# Patient Record
Sex: Male | Born: 1950
Health system: Southern US, Community
[De-identification: ages and names within clinical notes are randomized; demographics above are authoritative.]

## PROBLEM LIST (undated history)

## (undated) ENCOUNTER — Encounter (HOSPITAL_COMMUNITY): Admission: RE | Payer: Self-pay | Source: Ambulatory Visit

## (undated) DIAGNOSIS — I10 Essential (primary) hypertension: Secondary | ICD-10-CM

## (undated) DIAGNOSIS — K219 Gastro-esophageal reflux disease without esophagitis: Secondary | ICD-10-CM

## (undated) DIAGNOSIS — G8929 Other chronic pain: Secondary | ICD-10-CM

## (undated) DIAGNOSIS — N289 Disorder of kidney and ureter, unspecified: Secondary | ICD-10-CM

## (undated) DIAGNOSIS — R6889 Other general symptoms and signs: Secondary | ICD-10-CM

## (undated) DIAGNOSIS — R221 Localized swelling, mass and lump, neck: Secondary | ICD-10-CM

## (undated) DIAGNOSIS — C760 Malignant neoplasm of head, face and neck: Secondary | ICD-10-CM

## (undated) DIAGNOSIS — Z973 Presence of spectacles and contact lenses: Secondary | ICD-10-CM

## (undated) DIAGNOSIS — E079 Disorder of thyroid, unspecified: Secondary | ICD-10-CM

## (undated) DIAGNOSIS — C801 Malignant (primary) neoplasm, unspecified: Secondary | ICD-10-CM

## (undated) HISTORY — DX: Malignant neoplasm of head, face and neck (CMS HCC): C76.0

## (undated) HISTORY — DX: Essential (primary) hypertension: I10

## (undated) HISTORY — DX: Disorder of thyroid, unspecified: E07.9

## (undated) HISTORY — DX: Gastro-esophageal reflux disease without esophagitis: K21.9

## (undated) HISTORY — DX: Malignant (primary) neoplasm, unspecified: C80.1

## (undated) HISTORY — PX: OTHER SURGICAL HISTORY: SHX169

## (undated) SURGERY — IR INFUSAPORT PLACEMENT
Laterality: Left

## (undated) SURGERY — IR G TUBE PLACEMENT

---

## 2008-02-29 ENCOUNTER — Ambulatory Visit: Payer: Self-pay | Admitting: Family Medicine

## 2008-03-11 ENCOUNTER — Ambulatory Visit: Payer: Self-pay | Admitting: Family Medicine

## 2009-11-11 ENCOUNTER — Other Ambulatory Visit (HOSPITAL_COMMUNITY): Payer: Self-pay | Admitting: EXTERNAL

## 2010-01-25 ENCOUNTER — Ambulatory Visit
Admission: RE | Admit: 2010-01-25 | Discharge: 2010-01-25 | Disposition: A | Discharge status: Home / Self Care | Payer: 59 | Source: Ambulatory Visit | Attending: EXTERNAL | Admitting: EXTERNAL

## 2010-01-25 DIAGNOSIS — K759 Inflammatory liver disease, unspecified: Secondary | ICD-10-CM | POA: Insufficient documentation

## 2010-03-17 ENCOUNTER — Other Ambulatory Visit (HOSPITAL_COMMUNITY): Payer: Self-pay | Admitting: PHYSICIAN ASSISTANT

## 2010-05-12 ENCOUNTER — Ambulatory Visit
Admission: RE | Admit: 2010-05-12 | Discharge: 2010-05-12 | Disposition: A | Discharge status: Home / Self Care | Payer: 59 | Source: Ambulatory Visit

## 2010-05-12 DIAGNOSIS — D1800 Hemangioma unspecified site: Secondary | ICD-10-CM | POA: Insufficient documentation

## 2010-05-12 MED ORDER — IOVERSOL 320 MG IODINE/ML INTRAVENOUS SOLUTION
120.00 mL | INTRAVENOUS | Status: AC
Start: 2010-05-12 — End: 2010-05-12
  Administered 2010-05-12: 120 mL via INTRAVENOUS

## 2010-05-12 MED ORDER — DIATRIZOATE MEGLUMINE-DIATRIZOATE SODIUM 66 %-10 % ORAL SOLUTION
8.00 mL | ORAL | Status: AC
Start: 2010-05-12 — End: 2010-05-12
  Administered 2010-05-12: 8 mL via ORAL

## 2010-05-12 NOTE — Nurses Notes (Signed)
 Arrived for contrasted abdominal CT.History of CRI and HTN.Creatinine 1.5 today.Dr.Hamilton notified.OK for CT.Instructed to hydrate with at least 64 oz.for 2 days-verbalized understanding.

## 2014-12-23 ENCOUNTER — Encounter (INDEPENDENT_AMBULATORY_CARE_PROVIDER_SITE_OTHER): Payer: Self-pay | Admitting: Otolaryngology

## 2014-12-23 ENCOUNTER — Ambulatory Visit (HOSPITAL_BASED_OUTPATIENT_CLINIC_OR_DEPARTMENT_OTHER): Payer: 59 | Admitting: Rheumatology

## 2014-12-23 ENCOUNTER — Other Ambulatory Visit (INDEPENDENT_AMBULATORY_CARE_PROVIDER_SITE_OTHER): Payer: Self-pay | Admitting: Otolaryngology

## 2014-12-23 ENCOUNTER — Ambulatory Visit: Payer: 59 | Attending: Otolaryngology | Admitting: Otolaryngology

## 2014-12-23 VITALS — BP 124/62 | HR 90 | Temp 97.8°F | Ht 67.52 in | Wt 187.6 lb

## 2014-12-23 DIAGNOSIS — R221 Localized swelling, mass and lump, neck: Secondary | ICD-10-CM

## 2014-12-23 DIAGNOSIS — J387 Other diseases of larynx: Secondary | ICD-10-CM | POA: Insufficient documentation

## 2014-12-23 DIAGNOSIS — D487 Neoplasm of uncertain behavior of other specified sites: Secondary | ICD-10-CM

## 2014-12-23 DIAGNOSIS — Z7982 Long term (current) use of aspirin: Secondary | ICD-10-CM | POA: Insufficient documentation

## 2014-12-23 DIAGNOSIS — Z79899 Other long term (current) drug therapy: Secondary | ICD-10-CM | POA: Insufficient documentation

## 2014-12-23 DIAGNOSIS — I10 Essential (primary) hypertension: Secondary | ICD-10-CM | POA: Insufficient documentation

## 2014-12-23 DIAGNOSIS — Z791 Long term (current) use of non-steroidal anti-inflammatories (NSAID): Secondary | ICD-10-CM | POA: Insufficient documentation

## 2014-12-24 LAB — HISTORICAL CYTOPATHOLOGY-FINE NEEDLE ASPIRATE

## 2014-12-24 NOTE — H&P (Addendum)
PATIENT NAME:  Clayton Wheeler  MRN:  [id number]  DOB:  08/26/1950  DATE OF SERVICE: 12/23/2014    Chief Complaint:  Neck Mass      HPI:  Clayton Wheeler is a 64 y.o. male who presents today with complaints of right neck mass.  He states he first noted this about 6 months ago.  It has gotten rather large recently.  He has had a CT scan done at [facility].  He is an inmate at [facility].  He states that he has some mild sore throats and some burning when swallowing liquids.  He has not choked or aspirated.  He denies any fevers or chills.  He denies night sweats.  He has no change in his voice or shortness of breath.  He denies any smoking history.  He has no other complaints today.  He states he has not had any biopsies.      Past Medical History:  Past Medical History   Diagnosis Date    HTN (hypertension)            Past Surgical History:  History reviewed. No pertinent past surgical history.        Family History:  Family History   Problem Relation Age of Onset    Diabetes Mother     Cancer Mother     Diabetes Sister     Diabetes Brother            Social History:  History   Smoking status    Never Smoker    Smokeless tobacco    Not on file     History   Alcohol Use No     Social History     Occupational History    Not on file.       Medications:  Outpatient Prescriptions Marked as Taking for the 12/23/14 encounter (Office Visit) with Jola Schmidt, MD   Medication Sig    allopurinol (ZYLOPRIM) 100 mg Oral Tablet Take 100 mg by mouth Once a day    amLODIPine (NORVASC) 10 mg Oral Tablet Take 10 mg by mouth Once a day    aspirin (ECOTRIN) 81 mg Oral Tablet, Delayed Release (E.C.) Take 81 mg by mouth Once a day    docusate sodium (COLACE) 100 mg Oral Capsule Take 100 mg by mouth Twice daily    naproxen (NAPROSYN) 500 mg Oral Tablet Take 500 mg by mouth Twice daily with food       Allergies:  No Known Allergies    Review of Systems:  Do you have any fevers: no   Any  weight change: no   Change in your vision: no    Chest Pain: no   Shortness of Breath: no   Stomach pain: no   Urinary difficulity: yes Explain Urinary Difficulty: sometimes Joint Pain: yes Explain Joint Pain: back pain  Skin Problems: no   Weakness or Numbness: no   Easy Bruising or Bleeding: no   Excessive Thirst: no   Seasonal Allergies: no    All other systems reviewed and found to be negative.    Physical Exam:  Blood pressure 124/62, pulse 90, temperature 36.6 C (97.8 F), height 1.715 m (5' 7.52"), weight 85.1 kg (187 lb 9.8 oz), SpO2 99 %.  Body mass index is 28.93 kg/(m^2).  General Appearance: Pleasant, cooperative, healthy, and in no acute distress.  Eyes: Conjunctivae/corneas clear, PERRLA, EOM's intact.  Head and Face: Normocephalic, atraumatic.  Face symmetric, no  obvious lesions.   Pinnae: Normal shape and position.   External auditory canals:  Patent without inflammation.  Tympanic membranes:  Intact, translucent, midposition, middle ear aerated.  Nose:  External pyramid midline. Septum midline. Mucosa normal. No purulence, polyps, or crusts.   Oral Cavity/Oropharynx: No mucosal lesions, masses, or pharyngeal asymmetry.  Tonsils: 1-2+ on the right, 1 + left  Hypopharynx/Larynx: Indirect mirror laryngoscopy revealed an exophytic mass in the base of tongue and vallecula  Neck:  Large firm, slightly mobile neck mass on the right.    Heme/Lymph:  No cervical adenopathy.  Cardiovascular:  Good perfusion of upper extremities.  No cyanosis of the hands or fingers.  Lungs: No apparent stridorous breathing. No acute distress.  Skin: Skin warm and dry.  Neurologic: Cranial nerves:  grossly intact.  Psychiatric:  Alert and oriented x 3.    Procedure:  FNA biopsy was performed today of the right neck mass.  The neck was cleansed with Betadine and a 0.5 mL of lidocaine with epinephrine was injected.  Three attempts at fine-needle aspiration were performed and sent to pathology.  The patient experienced minimal  to no bleeding.    Flexible laryngoscopy was performed today using Elba Barman flexible laryngoscope.  The nose was sprayed with lidocaine and phenylephrine solution.  The scope was passed through the nose and the nose and nasopharynx were viewed with no abnormalities.  Examination of the hypopharynx reveals an exophytic mass coming off the base of tongue vallecula area.  The epiglottis appears clear, no masses of the AE fold or piriform sinuses.  The vocal cords are mobile bilaterally.  The patient tolerated the procedure well.      Data Reviewed: CT neck was reviewed on [facility] today with Dr. Beaulah Corin.  This did reveal a 7-cm neck mass.  It also showed a large base of tongue mass.        Assessment:  1. Neck mass    2. Mass of vallecula        Plan:  FNA biopsy was performed today of right neck mass.  Consent was obtained today for panendoscopy with biopsy.  The risks and benefits of surgery were discussed and questions answered to the patient's satisfaction.  Dr. Beaulah Corin advised the patient that after we get the results of the FNA, we would like to order a PET/CT scan.  This is concerning for malignancy.  I would also like to see him back the day of the PET/CT to review the scans.  We also will do preadmission testing.  We will contact the prison facility to arrange this.  The patient states good understanding and he is in agreement.      Orders Placed This Encounter    HEAD AND NECK BIOPSY (AMB ONLY)    PET LF:YBOF (HEAD TO THIGH) W IV CONTRAST    CYTOPATHOLOGY-FINE NEEDLE ASPIRATE (BEAKER)    CYTOPATHOLOGY-FINE NEEDLE ASPIRATE     Pt seen in clinic with Dr. Karie Mainland, PA-C 12/24/2014, 15:23  I personally saw and evaluated the patient. See mid-level's note for additional details. My findings/participation are right neck mass and BOT mass, FNA done today as described above. I discussed that if this is positive, will need to proceed with biopsy of the BOT and PET scan as well, the patient expressed  understanding and is agreeable.    Jola Schmidt, MD      PCP:  Spiro Girard [id number]  REF:  Jenell Milliner., MD  [address]  [place], [place] [id number]

## 2015-01-14 ENCOUNTER — Other Ambulatory Visit (INDEPENDENT_AMBULATORY_CARE_PROVIDER_SITE_OTHER): Payer: Self-pay

## 2015-01-14 ENCOUNTER — Encounter (INDEPENDENT_AMBULATORY_CARE_PROVIDER_SITE_OTHER): Payer: Self-pay

## 2015-01-14 ENCOUNTER — Ambulatory Visit
Admission: RE | Admit: 2015-01-14 | Discharge: 2015-01-14 | Disposition: A | Discharge status: Home / Self Care | Payer: 59 | Source: Ambulatory Visit | Attending: Otolaryngology | Admitting: Otolaryngology

## 2015-01-14 VITALS — BP 142/84 | HR 90 | Temp 98.9°F | Ht 67.8 in | Wt 190.3 lb

## 2015-01-14 DIAGNOSIS — R221 Localized swelling, mass and lump, neck: Secondary | ICD-10-CM | POA: Insufficient documentation

## 2015-01-14 DIAGNOSIS — I10 Essential (primary) hypertension: Principal | ICD-10-CM | POA: Insufficient documentation

## 2015-01-14 DIAGNOSIS — Z01818 Encounter for other preprocedural examination: Secondary | ICD-10-CM

## 2015-01-14 DIAGNOSIS — R9431 Abnormal electrocardiogram [ECG] [EKG]: Secondary | ICD-10-CM

## 2015-01-14 DIAGNOSIS — J387 Other diseases of larynx: Secondary | ICD-10-CM

## 2015-01-14 HISTORY — DX: Other general symptoms and signs: R68.89

## 2015-01-14 HISTORY — DX: Other chronic pain: G89.29

## 2015-01-14 HISTORY — DX: Disorder of kidney and ureter, unspecified: N28.9

## 2015-01-14 HISTORY — DX: Localized swelling, mass and lump, neck: R22.1

## 2015-01-14 HISTORY — DX: Gastro-esophageal reflux disease without esophagitis: K21.9

## 2015-01-14 HISTORY — DX: Presence of spectacles and contact lenses: Z97.3

## 2015-01-14 LAB — BASIC METABOLIC PANEL
ANION GAP: 9 mmol/L (ref 4–13)
BUN/CREA RATIO: 10 (ref 6–22)
BUN: 15 mg/dL (ref 8–25)
CALCIUM: 9.2 mg/dL (ref 8.5–10.4)
CHLORIDE: 105 mmol/L (ref 96–111)
CO2 TOTAL: 26 mmol/L (ref 22–32)
CREATININE: 1.52 mg/dL — ABNORMAL HIGH (ref 0.62–1.27)
ESTIMATED GFR: >59 mL/min/1.73mˆ2 (ref >59)
GLUCOSE: 112 mg/dL (ref 65–139)
POTASSIUM: 4.1 mmol/L (ref 3.5–5.1)
SODIUM: 140 mmol/L (ref 136–145)

## 2015-01-14 LAB — POC BLOOD GLUCOSE (RESULTS): GLUCOSE, POC: 90 mg/dL (ref 70–105)

## 2015-01-16 ENCOUNTER — Encounter (HOSPITAL_COMMUNITY): Payer: Self-pay

## 2015-01-16 ENCOUNTER — Encounter (HOSPITAL_COMMUNITY)
Admission: RE | Disposition: A | Discharge status: Home / Self Care | Payer: Self-pay | Source: Ambulatory Visit | Attending: Otolaryngology

## 2015-01-16 ENCOUNTER — Ambulatory Visit (HOSPITAL_COMMUNITY): Payer: 59 | Admitting: Anesthesiology

## 2015-01-16 ENCOUNTER — Ambulatory Visit (HOSPITAL_BASED_OUTPATIENT_CLINIC_OR_DEPARTMENT_OTHER): Payer: 59 | Admitting: Otolaryngology

## 2015-01-16 ENCOUNTER — Inpatient Hospital Stay
Admission: RE | Admit: 2015-01-16 | Discharge: 2015-01-16 | Disposition: A | Discharge status: Home / Self Care | Payer: 59 | Source: Ambulatory Visit | Attending: Otolaryngology | Admitting: Otolaryngology

## 2015-01-16 ENCOUNTER — Ambulatory Visit (HOSPITAL_BASED_OUTPATIENT_CLINIC_OR_DEPARTMENT_OTHER): Payer: 59 | Admitting: Anesthesiology

## 2015-01-16 DIAGNOSIS — Z7982 Long term (current) use of aspirin: Secondary | ICD-10-CM | POA: Insufficient documentation

## 2015-01-16 DIAGNOSIS — R220 Localized swelling, mass and lump, head: Secondary | ICD-10-CM

## 2015-01-16 DIAGNOSIS — R221 Localized swelling, mass and lump, neck: Secondary | ICD-10-CM

## 2015-01-16 DIAGNOSIS — I10 Essential (primary) hypertension: Secondary | ICD-10-CM | POA: Insufficient documentation

## 2015-01-16 DIAGNOSIS — C01 Malignant neoplasm of base of tongue: Secondary | ICD-10-CM | POA: Insufficient documentation

## 2015-01-16 DIAGNOSIS — C760 Malignant neoplasm of head, face and neck: Secondary | ICD-10-CM | POA: Insufficient documentation

## 2015-01-16 LAB — ECG 12-LEAD (PERFORMED IN PREADMISSION UNIT ONLY)
Atrial Rate: 95 {beats}/min
Calculated P Axis: 73 degrees
Calculated R Axis: -37 degrees
Calculated T Axis: 39 degrees
PR Interval: 132 ms
QRS Duration: 100 ms
QT Interval: 352 ms
QTC Calculation: 442 ms
Ventricular rate: 95 {beats}/min

## 2015-01-16 SURGERY — PANENDOSCOPY WITH BIOPSY
Anesthesia: General | Wound class: Clean Contaminated Wounds-The respiratory, GI, Genital, or urinary

## 2015-01-16 MED ORDER — LIDOCAINE (PF) 100 MG/5 ML (2 %) INTRAVENOUS SYRINGE
INJECTION | Freq: Once | INTRAVENOUS | Status: DC | PRN
Start: 2015-01-16 — End: 2015-01-16
  Administered 2015-01-16: 50 mg via INTRAVENOUS

## 2015-01-16 MED ORDER — MIDAZOLAM 1 MG/ML INJECTION SOLUTION
Freq: Once | INTRAMUSCULAR | Status: DC | PRN
Start: 2015-01-16 — End: 2015-01-16
  Administered 2015-01-16: 2 mg via INTRAVENOUS

## 2015-01-16 MED ORDER — LIDOCAINE 1 %-EPINEPHRINE 1:100,000 INJECTION SOLUTION
5.00 mL | Freq: Once | INTRAMUSCULAR | Status: DC | PRN
Start: 2015-01-16 — End: 2015-01-16

## 2015-01-16 MED ORDER — BACITRACIN 500 UNIT/G OINTMENT TUBE
TOPICAL_OINTMENT | Freq: Once | CUTANEOUS | Status: DC | PRN
Start: 2015-01-16 — End: 2015-01-16

## 2015-01-16 MED ORDER — ACETAMINOPHEN 300 MG-CODEINE 30 MG TABLET
2.00 | ORAL_TABLET | ORAL | Status: AC | PRN
Start: 2015-01-16 — End: ?

## 2015-01-16 MED ORDER — SODIUM CHLORIDE 0.9 % (FLUSH) INJECTION SYRINGE
2.00 mL | INJECTION | INTRAMUSCULAR | Status: DC | PRN
Start: 2015-01-16 — End: 2015-01-16

## 2015-01-16 MED ORDER — FENTANYL (PF) 50 MCG/ML INJECTION SOLUTION
Freq: Once | INTRAMUSCULAR | Status: DC | PRN
Start: 2015-01-16 — End: 2015-01-16
  Administered 2015-01-16: 100 ug via INTRAVENOUS

## 2015-01-16 MED ORDER — DEXAMETHASONE SODIUM PHOSPHATE 4 MG/ML INJECTION SOLUTION
Freq: Once | INTRAMUSCULAR | Status: DC | PRN
Start: 2015-01-16 — End: 2015-01-16
  Administered 2015-01-16: 8 mg via INTRAVENOUS

## 2015-01-16 MED ORDER — PROPOFOL 10 MG/ML IV BOLUS
INJECTION | Freq: Once | INTRAVENOUS | Status: DC | PRN
Start: 2015-01-16 — End: 2015-01-16
  Administered 2015-01-16: 150 mg via INTRAVENOUS

## 2015-01-16 MED ORDER — REMIFENTANIL 50 MCG/ML INFUSION - FOR ANES
INTRAVENOUS | Status: DC | PRN
Start: 2015-01-16 — End: 2015-01-16
  Administered 2015-01-16: 0 ug/kg/min via INTRAVENOUS
  Administered 2015-01-16: 0.05 ug/kg/min via INTRAVENOUS

## 2015-01-16 MED ORDER — ROCURONIUM 10 MG/ML INTRAVENOUS SOLUTION
Freq: Once | INTRAVENOUS | Status: DC | PRN
Start: 2015-01-16 — End: 2015-01-16
  Administered 2015-01-16: 50 mg via INTRAVENOUS

## 2015-01-16 MED ORDER — ONDANSETRON HCL (PF) 4 MG/2 ML INJECTION SOLUTION
Freq: Once | INTRAMUSCULAR | Status: DC | PRN
Start: 2015-01-16 — End: 2015-01-16
  Administered 2015-01-16: 4 mg via INTRAVENOUS

## 2015-01-16 MED ORDER — EPINEPHRINE 0.1 MG/ML INJECTION SYRINGE
10.00 mL | INJECTION | Freq: Once | INTRAMUSCULAR | Status: DC | PRN
Start: 2015-01-16 — End: 2015-01-16
  Administered 2015-01-16: 10 mL via TOPICAL

## 2015-01-16 MED ORDER — SODIUM CHLORIDE 0.9 % IRRIGATION SOLUTION
1000.00 mL | Status: DC | PRN
Start: 2015-01-16 — End: 2015-01-16
  Administered 2015-01-16: 1000 mL

## 2015-01-16 MED ORDER — OXYMETAZOLINE 0.05 % NASAL SPRAY
30.00 mL | Freq: Once | NASAL | Status: DC | PRN
Start: 2015-01-16 — End: 2015-01-16

## 2015-01-16 MED ORDER — HYDROMORPHONE 1 MG/ML INJECTION WRAPPER
0.40 mg | INJECTION | INTRAMUSCULAR | Status: DC | PRN
Start: 2015-01-16 — End: 2015-01-16
  Administered 2015-01-16 (×3): 0.4 mg via INTRAVENOUS
  Filled 2015-01-16 (×2): qty 1

## 2015-01-16 MED ORDER — SODIUM CHLORIDE 0.9 % (FLUSH) INJECTION SYRINGE
2.00 mL | INJECTION | Freq: Three times a day (TID) | INTRAMUSCULAR | Status: DC
Start: 2015-01-16 — End: 2015-01-16

## 2015-01-16 MED ORDER — PROPOFOL 10 MG/ML INTRAVENOUS EMULSION
INTRAVENOUS | Status: DC | PRN
Start: 2015-01-16 — End: 2015-01-16
  Administered 2015-01-16: 0 ug/kg/min via INTRAVENOUS
  Administered 2015-01-16: 50 ug/kg/min via INTRAVENOUS

## 2015-01-16 MED ORDER — LACTATED RINGERS INTRAVENOUS SOLUTION
INTRAVENOUS | Status: DC
Start: 2015-01-16 — End: 2015-01-16

## 2015-01-16 MED ADMIN — lactated Ringers intravenous solution: INTRAVENOUS | @ 09:00:00 | NDC 00338011704

## 2015-01-16 SURGICAL SUPPLY — 41 items
ADHESIVE TISSUE EXOFIN 1.0ML_PREMIERPRO EXOFIN (SEALANTS)
APPLIER PREM SRGCLP II SUP INTLK 9.75IN ATO INTERNAL CLIP VAS LF  DISP ENDOS RADGR MRK 20 MED TI (ENDOSCOPIC SUPPLIES) ×1 IMPLANT
APPLIER SURGICLIP 9.0 BLK (ENDOSCOPIC SUPPLIES) ×1 IMPLANT
APPLIER SURGICLIP 9.0 BLK (INSTRUMENTS ENDOMECHANICAL) ×1
APPLIER SURGICLIP MED TI 9.75_134051 6EA/BX (INSTRUMENTS ENDOMECHANICAL) ×1
BLANKET 3M BAIR HUG ADLT LWR B ODY 60X36IN PLMR AIR SYS LTWT (MISCELLANEOUS PT CARE ITEMS) ×2 IMPLANT
CONV USE 338643 - PACK SURG HEAD NK NONST DISP LF (CUSTOM TRAYS & PACK) IMPLANT
CONV USE ITEM 156524 - ADHESIVE TISSUE EXOFIN 1.0ML_PREMIERPRO EXOFIN (SEALANTS) IMPLANT
DISCONTINUED USE ITEM 97927 - SUTURE 3-0 SH-1 VICRYL 18IN VIOL CR BRD 8 STRN COAT ABS (SUTURE/WOUND CLOSURE) IMPLANT
DONUT EXTREMITY CUSHIONING 31143137 (POSITIONING PRODUCTS) ×2 IMPLANT
DRAIN INCS .25IN 12IN PNRS RUB SAF PIN RADOPQ STRL LTX STD DISP 4067 (Drains/Resovoirs) ×1 IMPLANT
DRAIN PENROSE 1/4INX12INL STRL_30414025 100/CS (Drains/Resovoirs) ×1
DRAPE 2 LYR ABS 70X40IN MED UN_IV LF DISP SURG BILAMINATE (PROTECTIVE PRODUCTS/GARMENTS) ×1
DRAPE FNFLD SHEET 70X40IN MED PRXM LF  STRL DISP SURG SMS (PROTECTIVE PRODUCTS/GARMENTS) ×1 IMPLANT
GOWN SURG XL AAMI L3 NONREINFO_RCE HKLP CLSR STRL LTX PNK SMS (DGOW)
GOWN SURG XL L3 NONREINFORCE HKLP CLSR STRL LTX PNK SMS 47IN (DGOW) IMPLANT
HANDLE RIGID PLASTIC STRL LF  DISP DVN EZ HNDL SURG LIGHT (INSTRUMENTS) ×1
HANDLE RIGID PLASTIC STRL LF_DISP DVN EZ HNDL SURG LIGHT (INSTRUMENTS) ×1
KIT RM TURNOVER CLEANOP CSTM INFCT CONTROL (KITS & TRAYS (DISPOSABLE)) ×1
KIT RM TURNOVER CLEANOP CSTM I_NFCT CONTROL (KITS & TRAYS (DISPOSABLE)) ×1
KIT RM TURNOVER CLEANOP CUSTOM INFCT CONTROL (KITS & TRAYS (DISPOSABLE)) ×1 IMPLANT
LABEL E-Z STICK_STLEZP1 100EA/CS (LABELS/CHART SUPPLIES) ×1
LABEL MED EZ PEEL MRKR LF (LABELS/CHART SUPPLIES) ×1 IMPLANT
MBO USE ITEM 317672 - HANDLE RIGID PLASTIC STRL LF  DISP DVN EZ HNDL SURG LIGHT (SURGICAL INSTRUMENTS) ×1 IMPLANT
PACK CUSTOM HEAD AND NECK (CUSTOM TRAYS & PACK)
PAD ARMBOARD FOAM BLU_FP-ECARM (POSITIONING PRODUCTS) ×2
PAD ARMBRD BLU (POSITIONING PRODUCTS) ×2 IMPLANT
SET IV BUTTERFLY 18GA 383539 80/CS (NEEDLES & SYRINGE SUPPLIES) ×2 IMPLANT
SPONGE GAUZE STRL 4 X 4IN TUB_6939 1280/CS (WOUND CARE SUPPLY) ×1 IMPLANT
SPONGE GAUZE STRL 4 X 4IN TUB_6939 1280/CS (WOUND CARE/ENTEROSTOMAL SUPPLY) ×1
SPONGE LAP 3X.5IN 1 STNG NEURO STRP STRL (WOUND CARE SUPPLY) ×1 IMPLANT
SPONGE NEUROSURGICAL 1/2INX3IN_SNS1230 (WOUND CARE/ENTEROSTOMAL SUPPLY) ×1
STIM HANDHELD LOCATOR VRSTIM3 NERVE STD SUBDERMIS (SURGICAL INSTRUMENTS) IMPLANT
STIM HANDHELD LOCATOR VRSTIM3_NERVE STD SUBDERMIS LF (INSTRUMENTS)
SUTURE 3-0 SH-1 VICRYL 18IN VI_OL CR BRD 8 STRN COAT ABS (SUTURE/WOUND CLOSURE)
SUTURE SILK 2-0 SH PERMAHAND 30IN BLK BRD NONAB (SUTURE/WOUND CLOSURE) ×1 IMPLANT
SUTURE SILK 2-0 SH PERMAHAND 3_0IN BLK BRD NONAB (SUTURE/WOUND CLOSURE) ×1
TRAY SKIN SCRUB 8IN VNYL COTTON 6 WNG 6 SPONGE STICK 2 TIP APPL DRY STRL LF (KITS & TRAYS (DISPOSABLE)) ×1 IMPLANT
TRAY SURG PREP SCR CR ESTM (KITS & TRAYS (DISPOSABLE)) ×1
TUBING SUCT CLR 20FT 9/32IN MEDIVAC NCDTV M/M CONN STRL LF (Suction) ×1 IMPLANT
TUBING SUCT CONN 20FT LONG_STRL N720A (Suction) ×1

## 2015-01-16 NOTE — Nurses Notes (Signed)
1130 Discharge orders reviewed with patient, acknowledges understanding. Prescription given to friend to pick up downstairs. Patient is an inmate Radiation protection practitioner. IV d/ced intact.  Christiana discharged patient to main lobby.

## 2015-01-16 NOTE — H&P (Addendum)
[facility]                                                     H&P Update Form    Clayton Wheeler, Clayton Wheeler, 64 y.o. male  Date of Admission:  01/16/2015  Date of Birth:  02-15-1950    01/16/2015    STOP: IF H&P IS GREATER THAN 30 DAYS FROM SURGICAL DAY COMPLETE NEW H&P IS REQUIRED.     H & P updated the day of the procedure.  1.  H&P completed within 30 days of surgical procedure by Dr. Beaulah Corin on 12/23/2014  and has been reviewed within 24 hours of the surgery, the patient has been examined, and no change has occured in the patients condition since the H&P was completed.       Change in medications: No      Last Menstrual Period: Not applicable      Comments:     2.  Patient continues to be appropiate candidate for planned surgical procedure. YES      Hulen Shouts, MD          I saw and examined the patient.  I reviewed the resident's note.  I agree with the findings and plan of care as documented in the resident's note.  Any exceptions/additions are edited/noted.    Jola Schmidt, MD

## 2015-01-16 NOTE — Anesthesia Transfer of Care (Signed)
ANESTHESIA TRANSFER OF CARE NOTE                Last Vitals: Temperature: 36.6 C (97.9 F) (01/16/15 0937)  Heart Rate: (!) 115 (01/16/15 0937)  BP (Non-Invasive): (!) 150/92 mmHg (01/16/15 0937)  Respiratory Rate: 18 (01/16/15 0937)  SpO2-1: 100 % (01/16/15 0937)  Pain Score (Numeric, Faces): 6 (01/16/15 [id number])    Patient transferred to PACU in stable condition. Report given to RN.    12/2/2016at 09:38.

## 2015-01-16 NOTE — Discharge Instructions (Signed)
SURGICAL DISCHARGE INSTRUCTIONS     Dr. Fancy, Tanya, MD  performed your PANENDOSCOPY WITH BIOPSY, BIOPSY WITH NEEDLE ASPIRATE today at the [facility]    [facility]:  Monday through Friday from 6 a.m. - 7 p.m.: [phone]  Between 7 p.m. - 6 a.m., weekends and holidays:  Call [facility] at [phone] or [phone].    PLEASE SEE WRITTEN HANDOUTS AS DISCUSSED BY YOUR NURSE:      SIGNS AND SYMPTOMS OF A WOUND / INCISION INFECTION   Be sure to watch for the following:   Increase in redness or red streaks near or around the wound or incision.   Increase in pain that is intense or severe and cannot be relieved by the pain medication that your doctor has given you.   Increase in swelling that cannot be relieved by elevation of a body part, or by applying ice, if permitted.   Increase in drainage, or if yellow / green in color and smells bad. This could be on a dressing or a cast.   Increase in fever for longer than 24 hours, or an increase that is higher than 101 degrees Fahrenheit (normal body temperature is 98 degrees Fahrenheit). The incision may feel warm to the touch.    **CALL YOUR DOCTOR IF ONE OR MORE OF THESE SIGNS / SYMPTOMS SHOULD OCCUR.    ANESTHESIA INFORMATION   ANESTHESIA -- ADULT PATIENTS:  You have received intravenous sedation / general anesthesia, and you may feel drowsy and light-headed for several hours. You may even experience some forgetfulness of the procedure. DO NOT DRIVE A MOTOR VEHICLE or perform any activity requiring complete alertness or coordination until you feel fully awake in about 24-48 hours. Do not drink alcoholic beverages for at least 24 hours. Do not stay alone, you must have a responsible adult available to be with you. You may also experience a dry mouth or nausea for 24 hours. This is a normal side effect and will disappear as the effects of the medication wear off.    REMEMBER   If you experience any difficulty breathing, chest pain,  bleeding that you feel is excessive, persistent nausea or vomiting or for any other concerns:  Call your physician Dr. Fancy  at [phone] or [phone]. You may also ask to have the ENT doctor on call paged. They are available to you 24 hours a day.    SPECIAL INSTRUCTIONS / COMMENTS       FOLLOW-UP APPOINTMENTS   Please call patient services at [phone] or [phone] to schedule a date / time of return. They are open Monday - Friday from 7:30 am - 5:00 pm.

## 2015-01-16 NOTE — Anesthesia Postprocedure Evaluation (Signed)
ANESTHESIA POSTOP EVALUATION NOTE         01/16/2015     Last Vitals: Temperature: 36.6 C (97.9 F) (01/16/15 0937)  Heart Rate: (!) 115 (01/16/15 0937)  BP (Non-Invasive): (!) 150/92 mmHg (01/16/15 0937)  Respiratory Rate: 18 (01/16/15 0937)  SpO2-1: 100 % (01/16/15 0937)  Pain Score (Numeric, Faces): 6 (01/16/15 0650)    Procedures:   PANENDOSCOPY WITH BIOPSY (N/A )  BIOPSY WITH NEEDLE ASPIRATE (N/A )    Patient is sufficiently recovered from the effects of anesthesia to participate in the evaluation and has returned to their pre-procedure level.  I have reviewed and evaluated the following:  Respiratory Function: Consistent with pre anesthetic level  Cardiovascular Function: Consistent with pre anesthetic level  Mental Status: Return to pre anesthetic baseline level  Pain: Sufficiently controlled with medication  Nausea and Vomiting: Absent or sufficiently controlled with medication  Post-op Anesthetic Complications: None    Comment/ re-evaluation for any variations: None

## 2015-01-16 NOTE — Anesthesia Preprocedure Evaluation (Signed)
Physical Exam:     Airway       Mallampati: II    TM distance: >3 FB    Mouth Opening: fair.  No Facial hair          Dental                    Pulmonary    Breath sounds clear to auscultation  (-) no rhonchi, no decreased breath sounds, no wheezes, no rales and no stridor     Cardiovascular    Rhythm: regular         Other findings            Anesthesia Plan:  Planned anesthesia type: general  ASA 3     Intravenous induction   Anesthetic plan and risks discussed with patient.             Patient's NPO status is appropriate for Anesthesia.         Plan discussed with CRNA.          NPO today. No previous anesthetics.

## 2015-01-16 NOTE — OR Surgeon (Addendum)
[facility] OF OTOLARYNGOLOGY - HEAD AND NECK SURGERY  OPERATIVE REPORT      NAME: Clayton Wheeler, 64 y.o. male  MRN: [id number]  DOB: [birth year]  DATE OF SERVICE: 01/16/2015    PREOPERATIVE DIAGNOSES:  1.  Base of tongue mass  2.  Right neck mass       POSTOPERATIVE DIAGNOSES:  Same    PROCEDURE:   1.  Telescope-Assisted Direct Laryngoscopy with Biopsies.  2.  Esophagoscopy.  3.  Bronchoscopy  4.  FNA right neck     SURGEONS: Andi Devon MD (Primary Surgeon), Jola Schmidt MD  (Staff Surgeon), Shari Prows MD (Resident Supervisor)    ANESTHESIA: General Anesthesia administered via Endotracheal Tube    ESTIMATED BLOOD LOSS: Minimal.    FLUIDS: Per anesthesia records.    OPERATIVE FINDINGS:  1. Right base of tongue mass.  2. Vocal cords, larynx, piriform sinuses, and vallecula were inspected and found to be normal in appearance without masses, lesions, or pooling of secretions.   3. Trachea down to the level of the carina normal in appearance.  4. Cervical esophagus down to the level of the gastrum with normal mucosa without lesions, masses, or stenosis.      SPECIMENS:    1. Right base of tongue  2. Right neck FNA    COMPLICATIONS:  None.    INDICATIONS FOR PROCEDURE:  This is a 64 y.o. male who presented to clinic with base of tongue and right neck mass. The patient was therefore brought to the operative suite to better visualize and biopsy any suspicious lesions via direct telescope-assisted laryngoscopy, bronchoscopy, and esophagoscopy.    DESCRIPTION OF PROCEDURE:  After ensuring appropriate informed consent had been obtained, the patient was escorted back to the operating suite by both Otolaryngology and Anesthesia.  Once in the operating suite, a surgical pause was conducted to ensure appropriate patient and procedure identification.  The patient was then endotracheally intubated by anesthesia and general anesthesia induced and administered via the endotracheal tube.  The bed  was then rotated 90 degrees. A tooth guard was placed in the mouth.  The oral cavity was inspected and palpated including the tonsils, floor of mouth, soft palate, and base of tongue. The right base of tongue was firm and abnormal, otherwise all other areas were soft without palpable masses.  A Lindholm vallecula scope was introduced and biopsies were taken of this mass and sent for frozen. Then a dido laryngoscope was advanced to the glottis.  A long rigid suction was used to suction secretions from the pharynx and larynx.  Using a 0 degree rigid scope with video camera, the vocal cords, larynx, piriform sinuses, vallecula, and base of tongue were inspected and found to be normal in appearance without masses, lesions, or pooling of secretions. The scope was then placed beyond the glottis to examine the trachea down to the level of the carina, which also appeared normal without masses, lesions, ulcerations, or accumulation of secretions distally. A rigid esophagoscope was then inserted into the cervical esophagus.  Using a endoscope with video camera, the cervical esophagus was examined and found to have normal mucosa without lesions, masses, or stenosis.      Dr. Beaulah Corin then biopsied the right neck mass with a 20 cc syringe and a 22 gauge needle with multiple passes. This was sent to pathology for frozen sections. A straight cupped forceps were used to take several biopsies from the right base of tongue and debrided. Epinephrine-soaked  cottonoid pledgets were used to achieve hemostasis. Pathology noted that the specimens obtained from the right base of tonguie and right neck FNA were posiotivie for squamous cell carcinoma. The patient was then allowed to awaken and was extubated without complication. They were then taken to the postoperative recovery area in stable condition.  Dr. Beaulah Corin was present for the key and critical portions of the case and immediately available at all times.    CONDITION:   Stable.    DISPOSITION:  Please discharge home when all [place] criteria are met.    Hulen Shouts, MD 01/16/2015 09:29     I was present for all key and/or critical portions and immediately available at all times for the surgical portion of the case. Additionally, I was present for the entire viewing portion from insertion to removal of scope for the endoscopy portion of the case.    Jola Schmidt, MD 01/16/2015, 11:04

## 2015-01-16 NOTE — Addendum Note (Signed)
Addendum  created 01/16/15 1806 by Louanna Raw, MD    Modules edited: Anesthesia Attestations

## 2015-01-18 NOTE — Addendum Note (Signed)
Addendum  created 01/18/15 1319 by Louanna Raw, MD    Modules edited: Anesthesia Attestations

## 2015-01-19 LAB — HISTORICAL CYTOPATHOLOGY-FINE NEEDLE ASPIRATE

## 2015-01-19 LAB — HISTORICAL SURGICAL PATHOLOGY SPECIMEN

## 2015-01-27 ENCOUNTER — Ambulatory Visit
Admission: RE | Admit: 2015-01-27 | Discharge: 2015-01-27 | Disposition: A | Discharge status: Home / Self Care | Payer: 59 | Source: Ambulatory Visit | Attending: Otolaryngology | Admitting: Otolaryngology

## 2015-01-27 DIAGNOSIS — C01 Malignant neoplasm of base of tongue: Secondary | ICD-10-CM

## 2015-01-27 DIAGNOSIS — J387 Other diseases of larynx: Secondary | ICD-10-CM

## 2015-01-27 DIAGNOSIS — R221 Localized swelling, mass and lump, neck: Secondary | ICD-10-CM

## 2015-01-27 DIAGNOSIS — C770 Secondary and unspecified malignant neoplasm of lymph nodes of head, face and neck: Secondary | ICD-10-CM

## 2015-01-27 LAB — POC BLOOD GLUCOSE (RESULTS): GLUCOSE, POC: 96 mg/dL (ref 70–105)

## 2015-01-27 MED ORDER — IOPAMIDOL 300 MG IODINE/ML (61 %) INTRAVENOUS SOLUTION
100.00 mL | INTRAVENOUS | Status: AC
Start: 2015-01-27 — End: 2015-01-27
  Administered 2015-01-27: 13:00:00 150 mL via INTRAVENOUS

## 2015-01-27 MED ADMIN — iopamidoL 61 % intravenous solution: INTRAVENOUS | @ 13:00:00

## 2015-01-30 ENCOUNTER — Other Ambulatory Visit (INDEPENDENT_AMBULATORY_CARE_PROVIDER_SITE_OTHER): Payer: Self-pay | Admitting: Otolaryngology

## 2015-01-30 DIAGNOSIS — C01 Malignant neoplasm of base of tongue: Secondary | ICD-10-CM

## 2015-02-02 ENCOUNTER — Ambulatory Visit (INDEPENDENT_AMBULATORY_CARE_PROVIDER_SITE_OTHER): Payer: Self-pay | Admitting: Otolaryngology

## 2015-02-02 NOTE — Telephone Encounter (Signed)
Recommendations for PET/CT, chemotherapy, and radiation therapy were called to medical at [facility] on 01/16/15.  I received a call on Friday asking for recommendations to be faxed to [facility] now that PET/CT was completed.  Orders for radiation and chemotherapy consultations faxed to [facility]--[phone] phone and [phone] Fax ---attn Mattie Marlin.

## 2015-02-03 ENCOUNTER — Ambulatory Visit (INDEPENDENT_AMBULATORY_CARE_PROVIDER_SITE_OTHER): Payer: Self-pay | Admitting: Otolaryngology

## 2015-02-03 ENCOUNTER — Ambulatory Visit (HOSPITAL_COMMUNITY): Payer: Self-pay

## 2015-02-03 ENCOUNTER — Ambulatory Visit (HOSPITAL_COMMUNITY): Payer: Self-pay | Admitting: Surgery

## 2015-02-03 NOTE — Telephone Encounter (Signed)
Regarding: Clayton Wheeler  ----- Message from Dorann Ou sent at 02/03/2015  1:44 PM EST -----  Janett Billow from [facility] needs a copy of Clayton Wheeler's treatment plan and plan of care asap      Please call Janett Billow at [phone]

## 2015-02-03 NOTE — Telephone Encounter (Signed)
I spoke with Mattie Marlin at [facility].  She did receive the orders for medical and radiation oncology consultation and the note from Dr. Beaulah Corin saying the patient needs a PET CT and radiation/chemotherapy.  She needs a the treatment plan faxed to her.  I asked her what she needs the treatment plan to say and she said she would transfer me to the PA.  I was disconnected when she transferred me and now her phone goes straight to voicemail x 3 calls.

## 2015-02-04 NOTE — Telephone Encounter (Signed)
I spoke with Nicanor Alcon, PA-c at [facility].  She has the documentation she needs to refer patient to radiation and medical oncology.

## 2015-02-11 ENCOUNTER — Other Ambulatory Visit (HOSPITAL_BASED_OUTPATIENT_CLINIC_OR_DEPARTMENT_OTHER): Payer: Self-pay

## 2015-02-11 DIAGNOSIS — IMO0002 Reserved for concepts with insufficient information to code with codable children: Secondary | ICD-10-CM

## 2015-02-12 ENCOUNTER — Other Ambulatory Visit (HOSPITAL_BASED_OUTPATIENT_CLINIC_OR_DEPARTMENT_OTHER): Payer: Self-pay

## 2015-02-12 ENCOUNTER — Ambulatory Visit (HOSPITAL_BASED_OUTPATIENT_CLINIC_OR_DEPARTMENT_OTHER): Payer: 59 | Admitting: Specialist

## 2015-02-12 ENCOUNTER — Encounter (HOSPITAL_BASED_OUTPATIENT_CLINIC_OR_DEPARTMENT_OTHER): Payer: Self-pay | Admitting: Specialist

## 2015-02-12 ENCOUNTER — Ambulatory Visit
Admission: RE | Admit: 2015-02-12 | Discharge: 2015-02-12 | Disposition: A | Discharge status: Home / Self Care | Payer: 59 | Source: Ambulatory Visit | Attending: Specialist | Admitting: Specialist

## 2015-02-12 DIAGNOSIS — C770 Secondary and unspecified malignant neoplasm of lymph nodes of head, face and neck: Secondary | ICD-10-CM

## 2015-02-12 DIAGNOSIS — Z7982 Long term (current) use of aspirin: Secondary | ICD-10-CM | POA: Insufficient documentation

## 2015-02-12 DIAGNOSIS — C029 Malignant neoplasm of tongue, unspecified: Secondary | ICD-10-CM | POA: Insufficient documentation

## 2015-02-12 DIAGNOSIS — IMO0002 Reserved for concepts with insufficient information to code with codable children: Secondary | ICD-10-CM

## 2015-02-12 DIAGNOSIS — I10 Essential (primary) hypertension: Secondary | ICD-10-CM | POA: Insufficient documentation

## 2015-02-12 DIAGNOSIS — N189 Chronic kidney disease, unspecified: Secondary | ICD-10-CM | POA: Insufficient documentation

## 2015-02-12 DIAGNOSIS — N289 Disorder of kidney and ureter, unspecified: Secondary | ICD-10-CM | POA: Insufficient documentation

## 2015-02-12 LAB — COMPREHENSIVE METABOLIC PANEL, NON-FASTING
ALBUMIN: 3.8 g/dL (ref 3.4–4.8)
ALKALINE PHOSPHATASE: 50 U/L (ref <150)
ALT (SGPT): 12 U/L (ref <55)
ANION GAP: 6 mmol/L (ref 4–13)
AST (SGOT): 18 U/L (ref 8–48)
BILIRUBIN TOTAL: 0.4 mg/dL (ref 0.3–1.3)
BUN/CREA RATIO: 9 (ref 6–22)
BUN: 16 mg/dL (ref 8–25)
CALCIUM: 9.4 mg/dL (ref 8.5–10.4)
CHLORIDE: 105 mmol/L (ref 96–111)
CO2 TOTAL: 28 mmol/L (ref 22–32)
CREATININE: 1.69 mg/dL — ABNORMAL HIGH (ref 0.62–1.27)
ESTIMATED GFR: 53 mL/min/1.73mˆ2 — ABNORMAL LOW (ref >59)
GLUCOSE: 110 mg/dL (ref 65–139)
GLUCOSE: 110 mg/dL (ref 65–139)
POTASSIUM: 5 mmol/L (ref 3.5–5.1)
PROTEIN TOTAL: 7.9 g/dL (ref 6.0–8.0)
SODIUM: 139 mmol/L (ref 136–145)

## 2015-02-12 LAB — CBC WITH DIFF
BASOPHIL #: 0.07 x10ˆ3/uL (ref 0.00–0.20)
BASOPHIL %: 1 %
EOSINOPHIL #: 0.27 x10ˆ3/uL (ref 0.00–0.50)
EOSINOPHIL %: 3 %
HCT: 47.5 % — ABNORMAL HIGH (ref 36.7–47.0)
HGB: 15.5 g/dL (ref 12.5–16.3)
LYMPHOCYTE #: 2.06 x10ˆ3/uL (ref 1.00–4.80)
LYMPHOCYTE %: 25 %
MCH: 30.2 pg (ref 27.4–33.0)
MCHC: 32.7 g/dL (ref 32.5–35.8)
MCV: 92.1 fL (ref 78.0–100.0)
MONOCYTE #: 0.91 x10ˆ3/uL (ref 0.30–1.00)
MONOCYTE %: 11 %
MPV: 6.9 fL — ABNORMAL LOW (ref 7.5–11.5)
NEUTROPHIL #: 5.06 x10ˆ3/uL (ref 1.50–7.70)
NEUTROPHIL %: 61 %
PLATELETS: 247 10*3/uL (ref 140–450)
PLATELETS: 247 x10ˆ3/uL (ref 140–450)
RBC: 5.15 x10ˆ6/uL (ref 4.06–5.63)
RDW: 13.7 % (ref 12.0–15.0)
WBC: 8.4 x10ˆ3/uL (ref 3.5–11.0)

## 2015-02-12 LAB — PLATELETS AND ANC CANCER CENTER
PLATELET COUNT (AUTO): 247 x10ˆ3/uL (ref 140–450)
PMN ABS (AUTO): 5.06 x10ˆ3/uL (ref 1.50–7.70)

## 2015-02-12 NOTE — Cancer Center Note (Addendum)
[place]       CANCER CENTER NOTE     Date: 02/12/2015  Name: Clayton Wheeler  MRN: [id number]  Referring Physician: Jola Schmidt, MD  Primary Care Provider: Rosebud Poles    REASON FOR VISIT:64 y.o.male is currently incarcerated for the past 6 years from [place] [id number] for evaluation and management of new diagnosis of SCCA of BOT.  HISTORY OF PRESENT ILLNESS:     Patient is referred by ENT Dr. Jola Schmidt for new BOT SCCA.    CANCER HISTORY:  1. 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 No fevers, NS or significant dysphagia  -Never smoker, but drank 1/5 bottle of gin per week and denies every having significant alcohol consumption  WORK-UP:  -CT NECK (12/15/14): large right Neck mass 7.7 x 5.2 x 4.1 cm with necrotic center and few scattered LN along jugular vein  -Flex Laryngoscopy w/ bx (01/16/15): Right BOT mass  - Indirect Laryngoscopy (02/01/15): Exophytic mass in BOT/Vallecular area  -PET (01/27/15): Right tongue base mass with extension tomidline and with bilateral neck LAN  STAGE: IVB T3N3M0  PATHOLOGY:  -RT BOT BX (01/16/15): MOD-DIFF SCCA, P16 Strongly Positive  -RIGHT NECK MASS BX (12/2/160: SCCA  TREATMENT:  -Definitive concurrent chemoXRT with CDDP at 25% dose reduction D1,22,43 anticipated      S: Pt has some right neck discomfort.      REVIEW OF SYSTEMS:  General: (+) pain. (-) fevers (-) chills. (+) weight loss. (-) fatigue.  Lymphatic: (-) palpable masses. (-) night sweats.  Heme: (-) easy bruising (-) bleeding.  (-) recurrent infections.   HEENT. (-) vision changes (-) hearing changes. (-) dysphagia. (+) sore throat.   Heart: (-) chest pain. (-) palpitation. (-) orthopnea. (-) LE edema.   Lungs: (-) dyspnea (on exertion) (-) hemoptysis. (-) cough.   Abdomen: (-) poor appetite. (-) abdominal pain. (-)  nausea (-) vomiting. (-) diarrhea. (-) constipation.   GU: (-) dysuria (-) Urgency. (-) Hematuria.   MS. (-) joint pain (-) ext swelling. (-) Back pain.    Dermatologic: (-) rashes. (-) pruritus.   Psychiatric: (-) Depression. (-) anxiety. (-) insomnia.   Neurologic: (-) headaches. (-) neuropathy. (-) weakness. (-) memory problems.  Other review of systems negative.     PAST MEDICAL HISTORY:  Past Medical History   Diagnosis Date    Chronic pain      back    Esophageal reflux     Head and neck cancer (HCC)     HTN (hypertension)     Kidney disease      passed a kidney stone    Neck mass     Neck problem      decreased motion and pain on right    Wears glasses          MEDICATIONS:  Current Outpatient Prescriptions   Medication Sig    acetaminophen-codeine (TYLENOL #3) 300-30 mg Oral Tablet Take 2 Tabs by mouth Every 4 hours as needed    amLODIPine (NORVASC) 10 mg Oral Tablet Take 10 mg by mouth Once a day    aspirin (ECOTRIN) 81 mg Oral Tablet, Delayed Release (E.C.) Take 81 mg by mouth Once a day    docusate sodium (COLACE) 100 mg Oral Capsule Take 100 mg  by mouth Twice daily    naproxen (NAPROSYN) 500 mg Oral Tablet Take 500 mg by mouth Twice daily with food     ALLERGIES:  No Known Allergies  PAST SURGICAL HISTORY:  History reviewed. No pertinent past surgical history.      SOCIAL HISTORY:  Social History     Social History    Marital status: Unknown     Spouse name: N/A    Number of children: N/A    Years of education: N/A     Occupational History    Not on file.     Social History Main Topics    Smoking status: Never Smoker    Smokeless tobacco: Never Used    Alcohol use Yes      Comment: none for 6 yrs    Drug use: No    Sexual activity: Not on file     Other Topics Concern    Routine Exercise Yes     walks 5 miles daily    Ability To Walk 2 Flight Of Steps Without Sob/Cp Yes    Unable To Ambulate No    Total Care No    Ability To Do Own Adl's Yes    Uses Walker No    Other  Activity Level No    Uses Cane No     Social History Narrative     FAMILY HISTORY:  Family History   Problem Relation Age of Onset    Diabetes Mother     Cancer Mother      Multiple Myeloma    Diabetes Sister     Cancer Sister      does not know what type of cancer    Diabetes Brother            PHYSICAL EXAMINATION:  Vitals:   Most Recent Vitals       Lab from 02/12/2015 in [facility]    Temperature 36 C (96.8 F) filed at... 02/12/2015 1033    Heart Rate 94 filed at... 02/12/2015 1033    Respiratory Rate 18 filed at... 02/12/2015 1033    BP (Non-Invasive) 140/80 filed at... 02/12/2015 1033    Height 1.778 m (_0 ) filed at... 02/12/2015 1033    Weight 86.3 kg (190 lb 4.1 oz) filed at... 02/12/2015 1033    BMI (Calculated) 27.36 filed at... 02/12/2015 1033    BSA (Calculated) 2.06 filed at... 02/12/2015 1033        ECOG PS 0 - Fully active, able to carry on all pre-disease performance without restriction.     General General: appears in good health, appears stated age and no distress  Eyes: Conjunctiva clear., Pupils equal and round. , Sclera non-icteric.   HENT:ENT without erythema or injection, mucous membranes moist.  Neck: Right anterior LAN of 6 x 6.2 cm, no appreciable left sided or other LAN, no supraclavicular LAN  Lungs: clear to auscultation bilaterally.   Cardiovascular:    Heart regular rate and rhythm without murmer  Abdomen: soft, non-tender, bowel sounds normal and non-distended  Extremities: no cyanosis or edema  Skin: Skin warm and dry, No rashes and No lesions  Neurologic: grossly normal and alert and oriented x3  Lymphatics: cervical adenopathy see neck section  Psychiatric: AOx3, normal and affect normal      LABORATORY:  Results for orders placed or performed during the hospital encounter of 02/12/15 (from the past 72 hour(s))   COMPREHENSIVE METABOLIC PANEL, NON-FASTING   Result Value Ref Range  SODIUM 139 136 - 145 mmol/L    POTASSIUM 5.0 3.5 - 5.1 mmol/L    CHLORIDE 105 96 - 111  mmol/L    CO2 TOTAL 28 22 - 32 mmol/L    ANION GAP 6 4 - 13 mmol/L    BUN 16 8 - 25 mg/dL    CREATININE 1.69 (H) 0.62 - 1.27 mg/dL    BUN/CREA RATIO 9 6 - 22    ESTIMATED GFR 53 (L) >59 mL/min/1.64m    ALBUMIN 3.8 3.4 - 4.8 g/dL    CALCIUM 9.4 8.5 - 10.4 mg/dL    GLUCOSE 110 65 - 139 mg/dL    ALKALINE PHOSPHATASE 50 <150 U/L    ALT (SGPT) 12 <55 U/L    AST (SGOT) 18 8 - 48 U/L    BILIRUBIN TOTAL 0.4 0.3 - 1.3 mg/dL    PROTEIN TOTAL 7.9 6.0 - 8.0 g/dL   BLOOD CELL COUNT W/DIFF - CANCER CENTER    Narrative    The following orders were created for panel order BLOOD CELL COUNT W/DIFF - CANCER CENTER.  Procedure                               Abnormality         Status                     ---------                               -----------         ------                     [facility]..Marland KitchenMarland Kitchen[[id number]] Normal              Final result               CBC WITH DXIPJ[[id number]]               Abnormal            Final result                 Please view results for these tests on the individual orders.   [facility]   Result Value Ref Range    PMN ABS (AUTO) 5.06 1.50 - 7.70 x103/uL    PLATELET COUNT (AUTO) 247 140 - 450 x103/uL   CBC WITH DIFF   Result Value Ref Range    WBC 8.4 3.5 - 11.0 x103/uL    RBC 5.15 4.06 - 5.63 x106/uL    HGB 15.5 12.5 - 16.3 g/dL    HCT 47.5 (H) 36.7 - 47.0 %    MCV 92.1 78.0 - 100.0 fL    MCH 30.2 27.4 - 33.0 pg    MCHC 32.7 32.5 - 35.8 g/dL    RDW 13.7 12.0 - 15.0 %    PLATELETS 247 140 - 450 x103/uL    MPV 6.9 (L) 7.5 - 11.5 fL    NEUTROPHIL % 61 %    LYMPHOCYTE % 25 %    MONOCYTE % 11 %    EOSINOPHIL % 3 %    BASOPHIL % 1 %    NEUTROPHIL # 5.06 1.50 - 7.70 x103/uL    LYMPHOCYTE # 2.06 1.00 - 4.80 x103/uL    MONOCYTE #  0.91 0.30 - 1.00 x103/uL    EOSINOPHIL # 0.27 0.00 - 0.50 x103/uL    BASOPHIL # 0.07 0.00 - 0.20 x103/uL       IMAGING:  I have reviewed the imaging studies and discussed them with the patient.    ASSESSMENT: 64 y.o. male with new  diagnosis of Stage IVB BOT SCCA to bilateral neck with a large >6 cm right neck LAN. No distant disease appreciated. Pt has creatinine clearance of 53 and mild chronic renal insufficieny.      PLAN:  1. Definitive concurrent chemoXRT with high dose CDDP. Due to CrCl of 53, he will require dose modification by 50% and extra visits for IVFs to maintain hydration and closer monitoring of his Creatinine.  2. Needs portacath and PEG  3. Needs XRT referral urgently for treatment to start  4. CBC and CMP done today.  5. Chemo education  6. Follow up with cycle 1.  7. Present to tumor board next week.

## 2015-02-17 ENCOUNTER — Ambulatory Visit (HOSPITAL_BASED_OUTPATIENT_CLINIC_OR_DEPARTMENT_OTHER): Payer: Self-pay | Admitting: Emergency Medicine

## 2015-02-18 ENCOUNTER — Other Ambulatory Visit (HOSPITAL_BASED_OUTPATIENT_CLINIC_OR_DEPARTMENT_OTHER): Payer: Self-pay | Admitting: Specialist

## 2015-02-18 NOTE — Progress Notes (Addendum)
HEAD AND NECK MULTIDISCIPLINARY TUMOR BOARD DISCUSSION:    PATIENT:  Clayton Wheeler  MRN:   [id number]  DOB:   August 11, 1950  AGE:  65 y.o.  DATE:  02/18/2015    REFERRING PROVIDER: No ref. provider found  PCP: Rosebud Poles    PRESENTER:  Fredric Dine, MD  TYPE OF PRESENTATION: Prospective  NATIONAL GUIDELINES DISCUSSED?: No  RADIOGRAPHS/PATHOLOGY REVIEWED AT [facility]?: Yes    DIAGNOSIS: Base of Tongue Cancer  DIAGNOSIS LOCATION: [place]  DIAGNOSIS METHOD:  Biopsy  HISTOLOGY: Squamous Cell Carcinoma  SITE: Oral Cavity  STAGE: Stage IVB   T3, N3, M0  RECURRENT?: No  PROGNOSTIC INDICATORS DISCUSSED?: Yes  HPV: Positive      INTERVENTIONS:   SURGICAL:  Patient had FNA of neck mass in November 2016 and panendoscopy with biopsies in December 2016 all with Dr. Beaulah Corin at [place]: Not at this time  TISSUE PROCUREMENT: No    RECOMMENDATIONS:   Chemoradiation  Refer patient to Radiation Oncologist  Patient should receive treatments through the prison system    [place]?: No  SPEECH THERAPY?: No  PHYSICAL THERAPY?: No  SOCIAL WORK?: No  DENTAL/ORAL SURGERY?: No    Velda Shell,  02/18/2015 10:54  Head and Neck Tumor Board Coordinator    Fredric Dine, MD

## 2015-02-19 ENCOUNTER — Ambulatory Visit (INDEPENDENT_AMBULATORY_CARE_PROVIDER_SITE_OTHER): Payer: Self-pay | Admitting: Otolaryngology

## 2015-02-19 ENCOUNTER — Ambulatory Visit
Admission: RE | Admit: 2015-02-19 | Discharge: 2015-02-19 | Payer: 59 | Source: Ambulatory Visit | Attending: Emergency Medicine | Admitting: Emergency Medicine

## 2015-02-19 VITALS — BP 160/89 | HR 82 | Temp 97.7°F | Resp 18 | Ht 67.0 in | Wt 190.3 lb

## 2015-02-19 DIAGNOSIS — C029 Malignant neoplasm of tongue, unspecified: Secondary | ICD-10-CM

## 2015-02-19 DIAGNOSIS — Z01818 Encounter for other preprocedural examination: Secondary | ICD-10-CM | POA: Insufficient documentation

## 2015-02-19 DIAGNOSIS — Z809 Family history of malignant neoplasm, unspecified: Secondary | ICD-10-CM | POA: Insufficient documentation

## 2015-02-19 DIAGNOSIS — G8929 Other chronic pain: Secondary | ICD-10-CM | POA: Insufficient documentation

## 2015-02-19 DIAGNOSIS — I10 Essential (primary) hypertension: Secondary | ICD-10-CM | POA: Insufficient documentation

## 2015-02-19 DIAGNOSIS — M549 Dorsalgia, unspecified: Secondary | ICD-10-CM | POA: Insufficient documentation

## 2015-02-19 DIAGNOSIS — R1310 Dysphagia, unspecified: Secondary | ICD-10-CM | POA: Insufficient documentation

## 2015-02-19 DIAGNOSIS — Z807 Family history of other malignant neoplasms of lymphoid, hematopoietic and related tissues: Secondary | ICD-10-CM | POA: Insufficient documentation

## 2015-02-19 DIAGNOSIS — IMO0002 Reserved for concepts with insufficient information to code with codable children: Secondary | ICD-10-CM

## 2015-02-19 DIAGNOSIS — Z7982 Long term (current) use of aspirin: Secondary | ICD-10-CM | POA: Insufficient documentation

## 2015-02-19 DIAGNOSIS — Z833 Family history of diabetes mellitus: Secondary | ICD-10-CM | POA: Insufficient documentation

## 2015-02-19 DIAGNOSIS — C770 Secondary and unspecified malignant neoplasm of lymph nodes of head, face and neck: Secondary | ICD-10-CM | POA: Insufficient documentation

## 2015-02-19 DIAGNOSIS — K219 Gastro-esophageal reflux disease without esophagitis: Secondary | ICD-10-CM | POA: Insufficient documentation

## 2015-02-19 DIAGNOSIS — C01 Malignant neoplasm of base of tongue: Secondary | ICD-10-CM | POA: Insufficient documentation

## 2015-02-19 NOTE — Telephone Encounter (Signed)
I called and left message for Nicanor Alcon, PA-c at [facility] to call me back.  I am calling to confirm patient was referred for radiation treatments and chemotherapy treatments and has started treatment.

## 2015-02-19 NOTE — Nurses Notes (Signed)
Patient for consult with Dr.Shaikh for SCCA of base of tongue. Updated medications;obtained vs; assessments completed. Informational packet provided; complaints of sore throat, trouble swallowing. Appetite good. Dr. Shelton Silvas to visit.

## 2015-02-20 NOTE — H&P (Signed)
Radiation Oncology Consult    ENCOUNTER DATE: 02/19/2015    PATIENT IDENTIFICATION  Clayton Wheeler  MRN #[id number]    Referring:  Fredric Dine, MD    DIAGNOSIS:  R BOT Stage 4b cT3N3M0, SCC, P16+    Chief Complaint:  Radiotherapy Consult for R BOT cancer      O ncologic Hx    Pt is a Never-smoker, +EtOH (1/5 bottle per week), presented with R neck mass growing over 6 monthsm 30 lbs wt loss (but was also trying to loose wt).      - 12/15/14 CT-Neck: large right Neck mass 7.7 x 5.2 x 4.1 cm with necrotic center and few scattered LN along jugular vein    - 01/16/15 Laryngoscopy/Esophagoscopy/Bronch + Bx of R BOT mass + FNA R neck mass (Dr. Beaulah Corin):  Right base of tongue mass, Vocal cords, larynx, piriform sinuses, and vallecula were inspected and found to be normal in appearance without masses, lesions, or pooling of secretions; Trachea down to the level of the carina normal in appearance; Cervical esophagus down to the level of the gastrum with normal mucosa without lesions, ma-sses, or stenosis. Path from R BOT showed Mod diff SCC, P16+; R Neck mass showed SCC.    -PET/CT (01/27/15): Right tongue base mass with extension the midline with bilateral neck adenopathy consistent with primary tongue malignancy with neck metastatic adenopathy bilaterally. R BOT(SUV 20.7) R Level 2 (SUV 23.6), R Level 3 (lower, SUV 3.6), L Level 3 (SUV 16.6) on PET (See PET details).     -Seen by Dr. Ivonne Andrew and plan is for Definitive ChemoRT with CDDP at 25% dose reduction D1,22,43 anticipated.    - He has upper dentures and partial lower dentures. Has had recent Dental Eval. And his remaining teeth are in good shape.     - Currently, in prison.        HPI:  65 y.o. male seen for consultation for cancer as described above.  Initially,     No real pain, but does have some difficulty with swallowing.    The patient presents for oncologic evaluation especially regarding radiation therapy.      Prior Radiation:   No  Prior Chemotherapy:    No  Pacemaker:   No  H/o Collagen Vascular Disease, Inflammatory Bowel disease: No      ROS:  All 10 systems review and Neg except as per HPI.    PE:    Visit Vitals    BP (!) 160/89    Pulse 82    Temp 36.5 C (97.7 F)    Resp 18    Ht 1.702 m (_0 )    Wt 86.3 kg (190 lb 4.1 oz)    SpO2 97%    BMI 29.8 kg/m2     KPS: 90% - capable of normal activity, few symptoms or signs of disease  Psychiatric: Pleasant, Normal affect, behavior, memory, thought content, judgement, and speech.  Gen: A&Ox3, WN, communicative, in no distress  HEENT:  PERRL, EOMI, No scleral icterus, Oorpharynx clear, He has upper dentures and partial lower dentures. Has had recent Dental Eval. And his remaining teeth are in good shape.  Neck: supple, no thyromegaly, b/l cervical LN (Level 2/3), no supraclavicular, axilla, or infraclavicular adenopathy  Cardiac:  RRR, No MRG, pulses present  Respiratory:  CTA-B, breathing comfortably  Abd: nl BS, NTND, No rebound or guarding  Extr: no LE edema; no calf tenderness  Spine: no spinal tenderness  Neuro: CN 2-12  grossly intact, 5/5 B UE/LE, intact LT/PP, Normal gait and station      LABs & RADs: Pertinent labs & imaging were reviewed.  See HPI.   PETCT/CT Findings:  BRAIN  Negative for significant malignant FDG metabolic uptake,  NECK  Positive for significant malignant FDG metabolic uptake.  Finding Name Lymph Dataset Quantification Unit Current                 r tong base   CT wb BiRuler Long Axis mm [phone]   r tong base   CT wb BiRuler Short Axis mm [id number]   r l2   CT wb BiRuler Long Axis mm 74.08144818563149   r l2   CT wb BiRuler Short Axis mm [phone]   r l3   CT wb BiRuler Long Axis mm 2.878676720947096   r l3   CT wb BiRuler Short Axis mm 2.836629476546503   l l3   CT wb BiRuler Long Axis mm 54.656812751700174   l l3   CT wb BiRuler Short Axis mm 94.49675916384665   r l3 lower   CT wb BiRuler Long Axis mm 99.357017793903009   r l3 lower   CT wb BiRuler  Short Axis mm 2.330076226333545                   Finding Name Lymph Dataset Quantification Unit Current                 r tong base   PET wb (AC) Max SUV BW g/ml 62.56389373428768   r l2   PET wb (AC) Max SUV BW g/ml 23.622104300079155   r l3   PET wb (AC) Max SUV BW g/ml 3.6297889691121736   l l3   PET wb (AC) Max SUV BW g/ml [phone]   r l3 lower   PET wb (AC) Max SUV BW g/ml 8.453646803212248                 Summary     Track Max g/ml 25.003704888916945        THORAX  Negative for significant malignant FDG metabolic uptake.  ABDOMEN  Negative for significant malignant FDG metabolic uptake.  PELVIS  Negative for significant malignant metabolic FDG uptake.  BONES  Negative for significant malignant FDG metabolic uptake  IMPRESSION:  Right tongue base mass with extension the midline with bilateral neck adenopathy consistent with primary tongue malignancy with neck metastatic adenopathy bilaterally.      Impression:  The patient is a 65 y.o. Male, prison inmate, with R BOT Stage 4b cT3N3M0, SCC, P16+.    Plan:   - We recommend Definitive ChemoRT to R BOT and b/l Neck to improve local control and possibly increase survival.    - He will ask to be seen by Dentist at the prison and let them know that he wil be getting RT to evaluate for an work before RT. He has upper dentures and partial lower dentures. Has had recent Dental Eval. And his remaining teeth are in good shape.  - CT-Sim ordered  - He will get concurrent Chemo with Dr. Ivonne Andrew who is planning on CDDP at 25% dose reduction D1,22,43 anticipated.  - The rationale for radiation therapy, its potential risks, benefits, short and long-term side effects, and complications, as well as alternative to radiation therapy were discussed in detail with the patient at the time of this consultation.  All questions were addressed.  No guarantees of safety or efficacy were made.  I spent greater than 50% of a 60 minute visit in discussion of the patient's  diagnosis and management.    Thank you for the opportunity to participate in the care of this patient.     ///////////////////////////////////////////////////////////////////////////////////////////////////////////////////////////////////////////////////////////////////////////  OTHER HISTORY DETAILS    Allergies:  No Known Allergies  Meds:    Outpatient Prescriptions Marked as Taking for the 02/19/15 encounter [facility] Encounter) with Vicente Serene, MD   Medication Sig    acetaminophen-codeine (TYLENOL #3) 300-30 mg Oral Tablet Take 2 Tabs by mouth Every 4 hours as needed    amLODIPine (NORVASC) 10 mg Oral Tablet Take 10 mg by mouth Once a day    aspirin (ECOTRIN) 81 mg Oral Tablet, Delayed Release (E.C.) Take 81 mg by mouth Once a day    docusate sodium (COLACE) 100 mg Oral Capsule Take 100 mg by mouth Twice daily    naproxen (NAPROSYN) 500 mg Oral Tablet Take 500 mg by mouth Twice daily with food     Past Medical History   Diagnosis Date    Chronic pain      back    Esophageal reflux     Head and neck cancer (HCC)     HTN (hypertension)     Kidney disease      passed a kidney stone    Neck mass     Neck problem      decreased motion and pain on right    Wears glasses       Family History   Problem Relation Age of Onset    Diabetes Mother     Cancer Mother      Multiple Myeloma    Diabetes Sister     Cancer Sister      does not know what type of cancer    Diabetes Brother       Social History     Social History    Marital status: Unknown     Spouse name: N/A    Number of children: N/A    Years of education: N/A     Social History Main Topics    Smoking status: Never Smoker    Smokeless tobacco: Never Used    Alcohol use Yes      Comment: none for 6 yrs    Drug use: No    Sexual activity: Not on file     Other Topics Concern    Routine Exercise Yes     walks 5 miles daily    Ability To Walk 2 Flight Of Steps Without Sob/Cp Yes    Unable To Ambulate No    Total Care No     Ability To Do Own Adl's Yes    Uses Walker No    Other Activity Level No    Uses Cane No     Social History Narrative     ///////////////////////////////////////////////////////////////////////////////////////////////////////////////////////////////////////////////////////////////////////////  M. Joanie Coddington, MD  Assistant Professor   [facility]    CC: Dr. Fredric Dine, MD, Dr. Beaulah Corin

## 2015-02-23 NOTE — Telephone Encounter (Signed)
Message left for Nicanor Alcon, PA-c at [facility] to call me back.

## 2015-02-25 NOTE — Telephone Encounter (Signed)
I spoke with Dr. Arville Go at [facility].  Patient has been designated for an emergent transfer to a facility in [place] for treatment, he is pending his transfer.  He has not yet met with Radiation oncology but they have urgent consults placed.

## 2015-02-27 ENCOUNTER — Other Ambulatory Visit (HOSPITAL_COMMUNITY): Payer: Self-pay | Admitting: Emergency Medicine

## 2015-02-27 DIAGNOSIS — C01 Malignant neoplasm of base of tongue: Secondary | ICD-10-CM

## 2015-03-04 ENCOUNTER — Inpatient Hospital Stay (HOSPITAL_COMMUNITY): Admission: RE | Admit: 2015-03-04 | Payer: 59 | Source: Ambulatory Visit

## 2016-08-04 DIAGNOSIS — C01 Malignant neoplasm of base of tongue: Secondary | ICD-10-CM | POA: Insufficient documentation

## 2016-09-23 DIAGNOSIS — I10 Essential (primary) hypertension: Secondary | ICD-10-CM | POA: Insufficient documentation

## 2016-09-23 DIAGNOSIS — K219 Gastro-esophageal reflux disease without esophagitis: Secondary | ICD-10-CM | POA: Insufficient documentation

## 2017-03-31 ENCOUNTER — Ambulatory Visit (INDEPENDENT_AMBULATORY_CARE_PROVIDER_SITE_OTHER): Payer: Medicaid Other | Admitting: Family Medicine

## 2017-03-31 ENCOUNTER — Encounter: Payer: Self-pay | Admitting: Family Medicine

## 2017-03-31 VITALS — BP 112/78 | HR 88 | Resp 16 | Ht 67.0 in | Wt 168.0 lb

## 2017-03-31 DIAGNOSIS — E890 Postprocedural hypothyroidism: Secondary | ICD-10-CM | POA: Diagnosis not present

## 2017-03-31 DIAGNOSIS — I10 Essential (primary) hypertension: Secondary | ICD-10-CM

## 2017-03-31 DIAGNOSIS — Z23 Encounter for immunization: Secondary | ICD-10-CM | POA: Diagnosis not present

## 2017-03-31 DIAGNOSIS — N529 Male erectile dysfunction, unspecified: Secondary | ICD-10-CM | POA: Diagnosis not present

## 2017-03-31 DIAGNOSIS — E039 Hypothyroidism, unspecified: Secondary | ICD-10-CM | POA: Insufficient documentation

## 2017-03-31 DIAGNOSIS — C01 Malignant neoplasm of base of tongue: Secondary | ICD-10-CM | POA: Diagnosis not present

## 2017-03-31 DIAGNOSIS — K219 Gastro-esophageal reflux disease without esophagitis: Secondary | ICD-10-CM | POA: Diagnosis not present

## 2017-03-31 MED ORDER — TRAMADOL HCL 50 MG PO TABS: 50.0000 mg | ORAL_TABLET | Freq: Three times a day (TID) | ORAL | 0 refills | Status: DC | PRN

## 2017-03-31 MED ORDER — SILDENAFIL CITRATE 100 MG PO TABS: 50.0000 mg | ORAL_TABLET | Freq: Every day | ORAL | 11 refills | Status: DC | PRN

## 2017-03-31 MED ORDER — RABEPRAZOLE SODIUM 20 MG PO TBEC: 20.0000 mg | DELAYED_RELEASE_TABLET | Freq: Every day | ORAL | 2 refills | Status: DC

## 2017-03-31 NOTE — Progress Notes (Signed)
Date:  03/31/2017   Name:  Troy [name]   DOB:  [birth year]   MRN:  [id number]  PCP:  Adline Potter, MD    Chief Complaint: Establish Care (Patient has been in prison for 8 years and treated for throat cancer. ) and Pain (neck pain related to throat cancer needs ref to cancer center and pain meds as the Tylenol is not helping. )   History of Present Illness:  This is a 67 y.o. male seen for initial visit. Incarcerated past 8 years, developed SCC tongue base with resection at [facility] 10/2016, determined inoperable, on Keytruda q 3wks while in prison. Neck pain poorly controlled on Tylenol 650 mg 2 tabs tid. Also c/o chronic LBP on Percocet tid before prison, occ radiation down L leg, told disc problem and arthritis. On Prilosec 40 mg daily but not helping GERD sxs, sister asks about Aciphex. On Synthroid and lisinopril since surgery. Unsure why taking asa, requests Viagra for ED, has helped in past. Father died 51 CVA, mother died melanoma 37, brother died seizures 17s. Lat tet imm 20 yrs ago, no pneumo or zoster imms, declines flu imm.  Review of Systems:  Review of Systems  Constitutional: Negative for chills and fever.  HENT: Negative for ear pain and trouble swallowing.   Respiratory: Negative for cough and shortness of breath.   Cardiovascular: Negative for chest pain and leg swelling.  Gastrointestinal: Negative for abdominal pain.  Genitourinary: Negative for difficulty urinating.  Neurological: Negative for syncope and light-headedness.    Patient Active Problem List   Diagnosis Date Noted  . Hypothyroidism 03/31/2017  . ED (erectile dysfunction) 03/31/2017  . GERD (gastroesophageal reflux disease) 09/23/2016  . Hypertension 09/23/2016  . Squamous cell carcinoma of base of tongue ([place]) 08/04/2016    Prior to Admission medications   Medication Sig Start Date End Date Taking? Authorizing Provider  acetaminophen (TYLENOL) 650 MG CR tablet Take 1,300 mg by mouth every 8 (eight)  hours.   Yes [provider]  levothyroxine (SYNTHROID, LEVOTHROID) 112 MCG tablet Take 112 mcg by mouth daily before breakfast.   Yes [provider]  lisinopril (PRINIVIL,ZESTRIL) 5 MG tablet Take 5 mg by mouth daily.   Yes [provider]  SODIUM FLUORIDE, DENTAL RINSE, 0.05 % SOLN Use as directed in the mouth or throat.   Yes [provider]  RABEprazole (ACIPHEX) 20 MG tablet Take 1 tablet (20 mg total) by mouth daily. 03/31/17   Lakin Romer, Gwyndolyn Saxon, MD  sildenafil (VIAGRA) 100 MG tablet Take 0.5-1 tablets (50-100 mg total) by mouth daily as needed for erectile dysfunction. 03/31/17   Lakenzie Mcclafferty, Gwyndolyn Saxon, MD  traMADol (ULTRAM) 50 MG tablet Take 1 tablet (50 mg total) by mouth every 8 (eight) hours as needed. 03/31/17   Adline Potter, MD    No Known Allergies  Past Surgical History:  Procedure Laterality Date  . tumor removed      Social History   Tobacco Use  . Smoking status: Never Smoker  . Smokeless tobacco: Never Used  Substance Use Topics  . Alcohol use: No    Frequency: Never  . Drug use: No    History reviewed. No pertinent family history.  Medication list has been reviewed and updated.  Physical Examination: BP 112/78   Pulse 88   Resp 16   Ht 5\' 7"  (1.702 m)   Wt 168 lb (76.2 kg)   SpO2 98%   BMI 26.31 kg/m   Physical Exam  Constitutional: He is  oriented to person, place, and time. He appears well-developed and well-nourished.  HENT:  Head: Normocephalic and atraumatic.  Right Ear: External ear normal.  Left Ear: External ear normal.  Nose: Nose normal.  Mouth/Throat: Oropharynx is clear and moist.  TMs clear  Eyes: Conjunctivae and EOM are normal. Pupils are equal, round, and reactive to light.  Neck: Neck supple. No thyromegaly present.  Postoperative changes R neck  Cardiovascular: Normal rate, regular rhythm and normal heart sounds.  Pulmonary/Chest: Effort normal and breath sounds normal.  Abdominal: Soft. He exhibits  no distension and no mass. There is no tenderness.  Musculoskeletal: He exhibits no edema.  Lymphadenopathy:    He has no cervical adenopathy.  Neurological: He is alert and oriented to person, place, and time. Coordination normal.  Romberg neg, gait normal  Skin: Skin is warm and dry.  Psychiatric: He has a normal mood and affect. His behavior is normal.  Nursing note and vitals reviewed.   Assessment and Plan:  1. Squamous cell carcinoma of base of tongue (HCC) S/p resection 10/2016 on Keytruda in prison, add tramadol 50 mg q8h prn #30 to Tylenol - Ambulatory referral to Oncology  2. Essential hypertension Well controlled on lisinopril - Comprehensive Metabolic Panel (CMET) - CBC - Lipid Profile  3. Gastroesophageal reflux disease, esophagitis presence not specified Poor control on Prilosec, trial Aciphex  4. Postoperative hypothyroidism On Synthroid - TSH  5. Erectile dysfunction, unspecified erectile dysfunction type Viagra prn  6. Need for diphtheria-tetanus-pertussis (Tdap) vaccine - Tdap vaccine greater than or equal to 7yo IM  7. Need for pneumococcal vaccination - Pneumococcal conjugate vaccine 13-valent  8. Med review D/c asa  Return in about 4 weeks (around 04/28/2017).   45 minutes spent with pt/family over half in counseling  Odalis Jordan M. [facility]  03/31/2017

## 2017-04-01 LAB — COMPREHENSIVE METABOLIC PANEL
ALBUMIN: 4.4 g/dL (ref 3.6–4.8)
ALK PHOS: 45 IU/L (ref 39–117)
ALT: 9 IU/L (ref 0–44)
AST: 16 IU/L (ref 0–40)
Albumin/Globulin Ratio: 1.7 (ref 1.2–2.2)
BUN/Creatinine Ratio: 11 (ref 10–24)
BUN: 19 mg/dL (ref 8–27)
Bilirubin Total: 0.4 mg/dL (ref 0.0–1.2)
CO2: 24 mmol/L (ref 20–29)
CREATININE: 1.79 mg/dL — AB (ref 0.76–1.27)
Calcium: 9.3 mg/dL (ref 8.6–10.2)
Chloride: 100 mmol/L (ref 96–106)
GFR calc Af Amer: 45 mL/min/{1.73_m2} — ABNORMAL LOW (ref >59)
GFR calc non Af Amer: 39 mL/min/{1.73_m2} — ABNORMAL LOW (ref >59)
GLUCOSE: 98 mg/dL (ref 65–99)
Globulin, Total: 2.6 g/dL (ref 1.5–4.5)
Potassium: 4.6 mmol/L (ref 3.5–5.2)
Sodium: 139 mmol/L (ref 134–144)
Total Protein: 7 g/dL (ref 6.0–8.5)

## 2017-04-01 LAB — CBC
HEMATOCRIT: 46.5 % (ref 37.5–51.0)
HEMOGLOBIN: 15.5 g/dL (ref 13.0–17.7)
MCH: 31.4 pg (ref 26.6–33.0)
MCHC: 33.3 g/dL (ref 31.5–35.7)
MCV: 94 fL (ref 79–97)
Platelets: 245 10*3/uL (ref 150–379)
RBC: 4.93 x10E6/uL (ref 4.14–5.80)
RDW: 15.5 % — ABNORMAL HIGH (ref 12.3–15.4)
WBC: 6.5 10*3/uL (ref 3.4–10.8)

## 2017-04-01 LAB — LIPID PANEL
CHOL/HDL RATIO: 3.8 ratio (ref 0.0–5.0)
Cholesterol, Total: 220 mg/dL — ABNORMAL HIGH (ref 100–199)
HDL: 58 mg/dL (ref >39)
LDL CALC: 144 mg/dL — AB (ref 0–99)
TRIGLYCERIDES: 90 mg/dL (ref 0–149)
VLDL Cholesterol Cal: 18 mg/dL (ref 5–40)

## 2017-04-01 LAB — TSH: TSH: 5.17 u[IU]/mL — ABNORMAL HIGH (ref 0.450–4.500)

## 2017-04-03 ENCOUNTER — Other Ambulatory Visit: Payer: Self-pay | Admitting: Family Medicine

## 2017-04-03 DIAGNOSIS — N1832 Chronic kidney disease, stage 3b: Secondary | ICD-10-CM | POA: Insufficient documentation

## 2017-04-03 DIAGNOSIS — N183 Chronic kidney disease, stage 3 unspecified: Secondary | ICD-10-CM | POA: Insufficient documentation

## 2017-04-03 MED ORDER — LEVOTHYROXINE SODIUM 125 MCG PO TABS: 125.0000 ug | ORAL_TABLET | Freq: Every day | ORAL | 2 refills | Status: DC

## 2017-04-05 ENCOUNTER — Other Ambulatory Visit: Payer: Self-pay | Admitting: Family Medicine

## 2017-04-05 MED ORDER — ESOMEPRAZOLE MAGNESIUM 40 MG PO CPDR: 40.0000 mg | DELAYED_RELEASE_CAPSULE | Freq: Every day | ORAL | 2 refills | Status: DC

## 2017-04-07 ENCOUNTER — Other Ambulatory Visit: Payer: Self-pay

## 2017-04-07 ENCOUNTER — Inpatient Hospital Stay: Payer: Medicaid Other | Attending: Oncology | Admitting: Oncology

## 2017-04-07 ENCOUNTER — Encounter: Payer: Self-pay | Admitting: Oncology

## 2017-04-07 VITALS — BP 125/90 | HR 87 | Resp 15 | Wt 165.0 lb

## 2017-04-07 DIAGNOSIS — Z9221 Personal history of antineoplastic chemotherapy: Secondary | ICD-10-CM | POA: Insufficient documentation

## 2017-04-07 DIAGNOSIS — C01 Malignant neoplasm of base of tongue: Secondary | ICD-10-CM

## 2017-04-07 DIAGNOSIS — Z7189 Other specified counseling: Secondary | ICD-10-CM

## 2017-04-07 DIAGNOSIS — E039 Hypothyroidism, unspecified: Secondary | ICD-10-CM | POA: Diagnosis not present

## 2017-04-07 DIAGNOSIS — K219 Gastro-esophageal reflux disease without esophagitis: Secondary | ICD-10-CM | POA: Insufficient documentation

## 2017-04-07 DIAGNOSIS — I10 Essential (primary) hypertension: Secondary | ICD-10-CM | POA: Diagnosis not present

## 2017-04-07 DIAGNOSIS — Z79899 Other long term (current) drug therapy: Secondary | ICD-10-CM | POA: Diagnosis not present

## 2017-04-07 DIAGNOSIS — R05 Cough: Secondary | ICD-10-CM | POA: Insufficient documentation

## 2017-04-07 DIAGNOSIS — Z923 Personal history of irradiation: Secondary | ICD-10-CM | POA: Insufficient documentation

## 2017-04-07 DIAGNOSIS — Z809 Family history of malignant neoplasm, unspecified: Secondary | ICD-10-CM | POA: Diagnosis not present

## 2017-04-07 NOTE — Progress Notes (Signed)
[place]  [phone] [phone]) [phone]  ID: Troy [name] OB: Jan 05, 1951  MR#: [id number]  CSN#:[id number]  Patient Care Team: Adline Potter, MD as PCP - General (Family Medicine)  CHIEF COMPLAINT: Recurrent squamous cell carcinoma of base of tongue.  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  He underwent concurrent chemotherapy and radiation with cisplatin completed on May 21, 2015.  Follow-up PET scan on September 02, 2015 was reported as equivocal.  Repeat PET scan on December 07, 2015 reported abnormal FDG uptake, but this was thought to be reactive from prior radiation therapy.  PET scan on Jul 05, 2016 revealed a level 2 lymph node increasing in size and an SUV.  Biopsy in June 2019 confirmed recurrence.  Patient had several delays in treatment, but was finally scheduled for surgery on October 21, 2016.  Unfortunately, he was found that the recurrence was inoperable.  Patient initiated palliative Beryle Flock every 3 weeks in approximately September 2018.  Patient has now been released from prison and is referred for continuation of treatment.  He has no neurologic complaints. He denies any recent fevers or illnesses.  He has a good appetite and denies weight loss.  He denies dysphagia, but occasionally has difficulty swallowing.  He has no chest pain or shortness of breath.  He has occasional cough particularly after eating.  He denies any nausea, vomiting, constipation, or diarrhea.  He has no urinary complaints.  Patient offers no further specific complaints today.  REVIEW OF SYSTEMS:   Review of Systems  Constitutional: Negative.  Negative for fever, malaise/fatigue and weight loss.  HENT: Negative.  Negative for sore throat.   Respiratory: Positive for cough. Negative for shortness of breath and stridor.   Cardiovascular: Negative.   Negative for chest pain and leg swelling.  Gastrointestinal: Negative.  Negative for abdominal pain, blood in stool and melena.  Genitourinary: Negative.  Negative for dysuria.  Musculoskeletal: Negative.   Skin: Negative.  Negative for rash.  Neurological: Negative.  Negative for sensory change and weakness.  Psychiatric/Behavioral: The patient is nervous/anxious.     As per HPI. Otherwise, a complete review of systems is negative.  PAST MEDICAL HISTORY: Past Medical History:  Diagnosis Date  . Cancer ([place])   . GERD (gastroesophageal reflux disease)   . Hypertension   . Thyroid disease     PAST SURGICAL HISTORY: Past Surgical History:  Procedure Laterality Date  . tumor removed      FAMILY HISTORY: Family History  Problem Relation Age of Onset  . Cancer Mother   . Cancer Sister   . Cancer Maternal Aunt   . Cancer Paternal Uncle     ADVANCED DIRECTIVES (Y/N):  N  HEALTH MAINTENANCE: Social History   Tobacco Use  . Smoking status: Never Smoker  . Smokeless tobacco: Never Used  Substance Use Topics  . Alcohol use: No    Frequency: Never  . Drug use: No     Colonoscopy:  PAP:  Bone density:  Lipid panel:  No Known Allergies  Current Outpatient Medications  Medication Sig Dispense Refill  . acetaminophen (TYLENOL) 650 MG CR tablet Take 1,300 mg by mouth every 8 (eight) hours.    Marland Kitchen esomeprazole (NEXIUM) 40 MG capsule Take 1 capsule (40 mg total) by mouth daily. 30 capsule 2  . levothyroxine (SYNTHROID, LEVOTHROID) 125 MCG tablet Take 1 tablet (125  mcg total) by mouth daily. 30 tablet 2  . lisinopril (PRINIVIL,ZESTRIL) 5 MG tablet Take 5 mg by mouth daily.    . sildenafil (VIAGRA) 100 MG tablet Take 0.5-1 tablets (50-100 mg total) by mouth daily as needed for erectile dysfunction. 5 tablet 11  . SODIUM FLUORIDE, DENTAL RINSE, 0.05 % SOLN Use as directed in the mouth or throat.    . traMADol (ULTRAM) 50 MG tablet Take 1 tablet (50 mg total) by mouth every 8  (eight) hours as needed. 30 tablet 0   No current facility-administered medications for this visit.     OBJECTIVE: Vitals:   04/07/17 1406  BP: 125/90  Pulse: 87  Resp: 15  SpO2: 100%     Body mass index is 25.84 kg/m.    ECOG FS:0 - Asymptomatic  General: Well-developed, well-nourished, no acute distress. Eyes: Pink conjunctiva, anicteric sclera. HEENT: Normocephalic, moist mucous membranes, clear oropharnyx.  No palpable adenopathy. Lungs: Clear to auscultation bilaterally. Heart: Regular rate and rhythm. No rubs, murmurs, or gallops. Abdomen: Soft, nontender, nondistended. No organomegaly noted, normoactive bowel sounds. Musculoskeletal: No edema, cyanosis, or clubbing. Neuro: Alert, answering all questions appropriately. Cranial nerves grossly intact. Skin: No rashes or petechiae noted. Psych: Normal affect. Lymphatics: No cervical, calvicular, axillary or inguinal LAD.   LAB RESULTS:  Lab Results  Component Value Date   NA 139 03/31/2017   K 4.6 03/31/2017   CL 100 03/31/2017   CO2 24 03/31/2017   GLUCOSE 98 03/31/2017   BUN 19 03/31/2017   CREATININE 1.79 (H) 03/31/2017   CALCIUM 9.3 03/31/2017   PROT 7.0 03/31/2017   ALBUMIN 4.4 03/31/2017   AST 16 03/31/2017   ALT 9 03/31/2017   ALKPHOS 45 03/31/2017   BILITOT 0.4 03/31/2017   GFRNONAA 39 (L) 03/31/2017   GFRAA 45 (L) 03/31/2017    Lab Results  Component Value Date   WBC 6.5 03/31/2017   HGB 15.5 03/31/2017   HCT 46.5 03/31/2017   MCV 94 03/31/2017   PLT 245 03/31/2017     STUDIES: No results found.  Oncology history: Patient was originally diagnosed with squamous cell carcinoma of the base of tongue (stage T3 N3 M0, p16+) in October 2016.  He underwent concurrent chemotherapy and radiation with cisplatin starting in February 2017 and completing on May 21, 2015.  Follow-up PET scan on September 02, 2015 was reported as equivocal.  Repeat PET scan on December 07, 2015 reported abnormal FDG uptake, but  this was thought to be reactive from prior radiation therapy.  PET scan on Jul 05, 2016 revealed a level 2 lymph node increasing in size and an SUV.  Biopsy in June 2019 confirmed recurrence.  Patient had several delays in treatment, but was finally scheduled for surgery on October 21, 2016.  Unfortunately, he was found that the recurrence was inoperable.  Patient initiated palliative Beryle Flock every 3 weeks on October 27, 2016.    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  PLAN:    1. 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  Patient reports his last infusion of Keytruda was approximately 5 weeks ago.  Will get a PET scan in the next week for reevaluation and restaging purposes.  Patient was also given a referral to ENT.  Return to clinic in 1 week for continuation of Keytruda every 3 weeks until intolerable side effects or progression of disease.  Approximately 60 minutes was spent in discussion of which  greater than 50% was consultation.  Patient expressed understanding and was in agreement with this plan. He also understands that He can call clinic at any time with any questions, concerns, or complaints.   Cancer Staging No matching staging information was found for the patient.  Lloyd Huger, MD   04/07/2017 3:07 PM

## 2017-04-09 NOTE — Progress Notes (Signed)
Troy [name]  Telephone:(336) [phone] Fax:(336) [phone]  ID: Troy [name] OB: March 25, 1950  MR#: [id number]  CSN#:[id number]  Patient Care Team: Adline Potter, MD as PCP - General (Family Medicine)  CHIEF COMPLAINT: Recurrent squamous cell carcinoma of base of tongue.  INTERVAL HISTORY: Patient returns to clinic today for further evaluation, discussion of his PET scan results, and continuation of cycle 6 of Keytruda.  He currently feels well and at his baseline. He has no neurologic complaints. He denies any recent fevers or illnesses.  He has a good appetite and denies weight loss.  He denies dysphagia, but occasionally has difficulty swallowing.  He has no chest pain or shortness of breath.  He has occasional cough particularly after eating.  He denies any nausea, vomiting, constipation, or diarrhea.  He has no urinary complaints.  Patient offers no further specific complaints today.  REVIEW OF SYSTEMS:   Review of Systems  Constitutional: Negative.  Negative for fever, malaise/fatigue and weight loss.  HENT: Negative.  Negative for sore throat.   Respiratory: Positive for cough. Negative for shortness of breath and stridor.   Cardiovascular: Negative.  Negative for chest pain and leg swelling.  Gastrointestinal: Negative.  Negative for abdominal pain, blood in stool and melena.  Genitourinary: Negative.  Negative for dysuria.  Musculoskeletal: Negative.   Skin: Negative.  Negative for rash.  Neurological: Negative.  Negative for sensory change and weakness.  Psychiatric/Behavioral: The patient is nervous/anxious.     As per HPI. Otherwise, a complete review of systems is negative.  PAST MEDICAL HISTORY: Past Medical History:  Diagnosis Date  . Cancer ([place])   . GERD (gastroesophageal reflux disease)   . Hypertension   . Thyroid disease     PAST SURGICAL HISTORY: Past Surgical History:  Procedure Laterality Date  . tumor removed      FAMILY  HISTORY: Family History  Problem Relation Age of Onset  . Cancer Mother   . Cancer Sister   . Cancer Maternal Aunt   . Cancer Paternal Uncle     ADVANCED DIRECTIVES (Y/N):  N  HEALTH MAINTENANCE: Social History   Tobacco Use  . Smoking status: Never Smoker  . Smokeless tobacco: Never Used  Substance Use Topics  . Alcohol use: No    Frequency: Never  . Drug use: No     Colonoscopy:  PAP:  Bone density:  Lipid panel:  No Known Allergies  Current Outpatient Medications  Medication Sig Dispense Refill  . acetaminophen (TYLENOL) 650 MG CR tablet Take 1,300 mg by mouth every 8 (eight) hours.    Marland Kitchen esomeprazole (NEXIUM) 40 MG capsule Take 1 capsule (40 mg total) by mouth daily. 30 capsule 2  . levothyroxine (SYNTHROID, LEVOTHROID) 125 MCG tablet Take 1 tablet (125 mcg total) by mouth daily. 30 tablet 2  . lisinopril (PRINIVIL,ZESTRIL) 5 MG tablet Take 5 mg by mouth daily.    . sildenafil (VIAGRA) 100 MG tablet Take 0.5-1 tablets (50-100 mg total) by mouth daily as needed for erectile dysfunction. 5 tablet 11  . SODIUM FLUORIDE, DENTAL RINSE, 0.05 % SOLN Use as directed in the mouth or throat.    . traMADol (ULTRAM) 50 MG tablet Take 1 tablet (50 mg total) by mouth every 8 (eight) hours as needed. 30 tablet 0   No current facility-administered medications for this visit.    Facility-Administered Medications Ordered in Other Visits  Medication Dose Route Frequency Provider Last Rate Last Dose  . pembrolizumab (KEYTRUDA) 200 mg in  sodium chloride 0.9 % 50 mL chemo infusion  200 mg Intravenous Once Lloyd Huger, MD        OBJECTIVE: Vitals:   04/14/17 0958  BP: 138/84  Pulse: 76  Resp: 20  Temp: (!) 96.4 F (35.8 C)     Body mass index is 26.03 kg/m.    ECOG FS:0 - Asymptomatic  General: Well-developed, well-nourished, no acute distress. Eyes: Pink conjunctiva, anicteric sclera. HEENT: Normocephalic, moist mucous membranes, clear oropharnyx.  No palpable  adenopathy. Lungs: Clear to auscultation bilaterally. Heart: Regular rate and rhythm. No rubs, murmurs, or gallops. Abdomen: Soft, nontender, nondistended. No organomegaly noted, normoactive bowel sounds. Musculoskeletal: No edema, cyanosis, or clubbing. Neuro: Alert, answering all questions appropriately. Cranial nerves grossly intact. Skin: No rashes or petechiae noted. Psych: Normal affect.   LAB RESULTS:  Lab Results  Component Value Date   NA 139 04/14/2017   K 4.4 04/14/2017   CL 104 04/14/2017   CO2 28 04/14/2017   GLUCOSE 114 (H) 04/14/2017   BUN 18 04/14/2017   CREATININE 1.64 (H) 04/14/2017   CALCIUM 8.8 (L) 04/14/2017   PROT 7.6 04/14/2017   ALBUMIN 4.1 04/14/2017   AST 20 04/14/2017   ALT 12 (L) 04/14/2017   ALKPHOS 45 04/14/2017   BILITOT 0.6 04/14/2017   GFRNONAA 42 (L) 04/14/2017   GFRAA 49 (L) 04/14/2017    Lab Results  Component Value Date   WBC 4.4 04/14/2017   NEUTROABS 2.4 04/14/2017   HGB 15.5 04/14/2017   HCT 45.9 04/14/2017   MCV 94.1 04/14/2017   PLT 210 04/14/2017     STUDIES: Nm Pet Image Restag (ps) Skull Base To Thigh  Result Date: 04/12/2017 CLINICAL DATA:  Subsequent treatment strategy for squamous cell carcinoma of the tongue base. Last chemotherapy/radiation therapy more than 1 year ago. No current complaints. EXAM: NUCLEAR MEDICINE PET SKULL BASE TO THIGH TECHNIQUE: 12.0 mCi F-18 FDG was injected intravenously. Full-ring PET imaging was performed from the skull base to thigh after the radiotracer. CT data was obtained and used for attenuation correction and anatomic localization. Fasting blood glucose: 82 mg/dl Mediastinal blood pool activity: SUV max 2.75 COMPARISON:  None relevant.  Lumbar spine MRI 03/11/2008. FINDINGS: NECK: No hypermetabolic cervical lymph nodes are identified.There are no lesions of the pharyngeal mucosal space. Incidental CT findings: none CHEST: There are no hypermetabolic mediastinal, hilar or axillary lymph  nodes. No suspicious pulmonary activity. Incidental CT findings: Mild dependent atelectasis at both lung bases. There is mild atherosclerosis of aorta, great vessels and coronary arteries. ABDOMEN/PELVIS: There is no hypermetabolic activity within the liver, adrenal glands, spleen or pancreas. There is no hypermetabolic nodal activity. Incidental CT findings: There are small hepatic cysts, devoid of metabolic activity. These measure 16 mm in the dome of the right lobe (CT image 105) and 19 mm in the caudate lobe (image 137). Mild aortic and branch vessel atherosclerosis. SKELETON: There is no hypermetabolic activity to suggest osseous metastatic disease. Incidental CT findings: Mild lumbar spondylosis. Small lipoma anteriorly in the proximal right thigh. IMPRESSION: 1. No evidence of hypermetabolic local recurrence in the oral cavity. 2. No evidence of metastatic disease within the neck, chest, abdomen or pelvis. No suspicious metabolic activity. 3. Small hepatic cysts.  Aortic Atherosclerosis (ICD10-I70.0). Electronically Signed   By: Richardean Sale M.D.   On: 04/12/2017 14:26    Oncology history: Patient was originally diagnosed with squamous cell carcinoma of the base of tongue (stage T3 N3 M0, p16+) in October 2016.  He underwent concurrent chemotherapy and radiation with cisplatin starting in February 2017 and completing on May 21, 2015.  Follow-up PET scan on September 02, 2015 was reported as equivocal.  Repeat PET scan on December 07, 2015 reported abnormal FDG uptake, but this was thought to be reactive from prior radiation therapy.  PET scan on Jul 05, 2016 revealed a level 2 lymph node increasing in size and an SUV.  Biopsy in June 2019 confirmed recurrence.  Patient had several delays in treatment, but was finally scheduled for surgery on October 21, 2016.  Unfortunately, he was found that the recurrence was inoperable.  Patient initiated palliative Beryle Flock every 3 weeks on October 27, 2016.     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  PLAN:    1. 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  PET scan results reviewed independently and reported as above with no evidence of recurrent or progressive disease.  Patient was previously given a referral to ENT to establish care.  Proceed with cycle 6 of Keytruda today.  Return to clinic in 3 weeks for further evaluation and consideration of cycle 7.  We will reimage in 4-6 months and plan to do one year of Keytruda if patient remains in remission.  Approximately 30 minutes was spent in discussion of which greater than 50% was consultation.  Patient expressed understanding and was in agreement with this plan. He also understands that He can call clinic at any time with any questions, concerns, or complaints.   Cancer Staging No matching staging information was found for the patient.  Lloyd Huger, MD   04/14/2017 10:18 AM

## 2017-04-10 ENCOUNTER — Ambulatory Visit: Payer: Self-pay | Admitting: Family Medicine

## 2017-04-12 ENCOUNTER — Encounter
Admission: RE | Admit: 2017-04-12 | Discharge: 2017-04-12 | Disposition: A | Discharge status: Home / Self Care | Payer: Medicaid Other | Source: Ambulatory Visit | Attending: Oncology | Admitting: Oncology

## 2017-04-12 DIAGNOSIS — C01 Malignant neoplasm of base of tongue: Secondary | ICD-10-CM

## 2017-04-12 LAB — GLUCOSE, CAPILLARY: Glucose-Capillary: 82 mg/dL (ref 65–99)

## 2017-04-12 MED ORDER — FLUDEOXYGLUCOSE F - 18 (FDG) INJECTION
12.0000 | Freq: Once | INTRAVENOUS | Status: AC | PRN
  Administered 2017-04-12: 12 via INTRAVENOUS

## 2017-04-14 ENCOUNTER — Inpatient Hospital Stay: Payer: Medicaid Other

## 2017-04-14 ENCOUNTER — Inpatient Hospital Stay: Payer: Medicaid Other | Attending: Oncology | Admitting: Oncology

## 2017-04-14 VITALS — BP 138/84 | HR 76 | Temp 96.4°F | Resp 20 | Wt 166.2 lb

## 2017-04-14 VITALS — BP 114/65 | HR 77 | Temp 96.0°F | Resp 18

## 2017-04-14 DIAGNOSIS — E079 Disorder of thyroid, unspecified: Secondary | ICD-10-CM | POA: Diagnosis not present

## 2017-04-14 DIAGNOSIS — R21 Rash and other nonspecific skin eruption: Secondary | ICD-10-CM | POA: Diagnosis not present

## 2017-04-14 DIAGNOSIS — Z809 Family history of malignant neoplasm, unspecified: Secondary | ICD-10-CM | POA: Insufficient documentation

## 2017-04-14 DIAGNOSIS — I700 Atherosclerosis of aorta: Secondary | ICD-10-CM

## 2017-04-14 DIAGNOSIS — I10 Essential (primary) hypertension: Secondary | ICD-10-CM | POA: Insufficient documentation

## 2017-04-14 DIAGNOSIS — K219 Gastro-esophageal reflux disease without esophagitis: Secondary | ICD-10-CM | POA: Insufficient documentation

## 2017-04-14 DIAGNOSIS — C01 Malignant neoplasm of base of tongue: Secondary | ICD-10-CM

## 2017-04-14 DIAGNOSIS — R1310 Dysphagia, unspecified: Secondary | ICD-10-CM

## 2017-04-14 DIAGNOSIS — K7689 Other specified diseases of liver: Secondary | ICD-10-CM | POA: Insufficient documentation

## 2017-04-14 DIAGNOSIS — R05 Cough: Secondary | ICD-10-CM | POA: Diagnosis not present

## 2017-04-14 DIAGNOSIS — Z5112 Encounter for antineoplastic immunotherapy: Secondary | ICD-10-CM | POA: Diagnosis not present

## 2017-04-14 DIAGNOSIS — Z79899 Other long term (current) drug therapy: Secondary | ICD-10-CM | POA: Insufficient documentation

## 2017-04-14 LAB — CBC WITH DIFFERENTIAL/PLATELET
Basophils Absolute: 0.1 10*3/uL (ref 0–0.1)
Basophils Relative: 1 %
EOS ABS: 0.2 10*3/uL (ref 0–0.7)
EOS PCT: 5 %
HCT: 45.9 % (ref 40.0–52.0)
HEMOGLOBIN: 15.5 g/dL (ref 13.0–18.0)
LYMPHS ABS: 1.2 10*3/uL (ref 1.0–3.6)
LYMPHS PCT: 28 %
MCH: 31.8 pg (ref 26.0–34.0)
MCHC: 33.8 g/dL (ref 32.0–36.0)
MCV: 94.1 fL (ref 80.0–100.0)
MONOS PCT: 13 %
Monocytes Absolute: 0.6 10*3/uL (ref 0.2–1.0)
NEUTROS PCT: 53 %
Neutro Abs: 2.4 10*3/uL (ref 1.4–6.5)
Platelets: 210 10*3/uL (ref 150–440)
RBC: 4.88 MIL/uL (ref 4.40–5.90)
RDW: 15.1 % — ABNORMAL HIGH (ref 11.5–14.5)
WBC: 4.4 10*3/uL (ref 3.8–10.6)

## 2017-04-14 LAB — COMPREHENSIVE METABOLIC PANEL
ALBUMIN: 4.1 g/dL (ref 3.5–5.0)
ALT: 12 U/L — AB (ref 17–63)
AST: 20 U/L (ref 15–41)
Alkaline Phosphatase: 45 U/L (ref 38–126)
Anion gap: 7 (ref 5–15)
BUN: 18 mg/dL (ref 6–20)
CHLORIDE: 104 mmol/L (ref 101–111)
CO2: 28 mmol/L (ref 22–32)
CREATININE: 1.64 mg/dL — AB (ref 0.61–1.24)
Calcium: 8.8 mg/dL — ABNORMAL LOW (ref 8.9–10.3)
GFR calc Af Amer: 49 mL/min — ABNORMAL LOW (ref >60)
GFR calc non Af Amer: 42 mL/min — ABNORMAL LOW (ref >60)
Glucose, Bld: 114 mg/dL — ABNORMAL HIGH (ref 65–99)
POTASSIUM: 4.4 mmol/L (ref 3.5–5.1)
SODIUM: 139 mmol/L (ref 135–145)
Total Bilirubin: 0.6 mg/dL (ref 0.3–1.2)
Total Protein: 7.6 g/dL (ref 6.5–8.1)

## 2017-04-14 MED ORDER — SODIUM CHLORIDE 0.9 % IV SOLN
200.0000 mg | Freq: Once | INTRAVENOUS | Status: AC
  Administered 2017-04-14: 200 mg via INTRAVENOUS
  Filled 2017-04-14: qty 8

## 2017-04-14 MED ORDER — SODIUM CHLORIDE 0.9 % IV SOLN
Freq: Once | INTRAVENOUS | Status: AC
  Administered 2017-04-14: 10:00:00 via INTRAVENOUS
  Filled 2017-04-14: qty 1000

## 2017-04-14 NOTE — Progress Notes (Signed)
Patient denies any concerns today.  

## 2017-04-15 LAB — THYROID PANEL WITH TSH
FREE THYROXINE INDEX: 2.5 (ref 1.2–4.9)
T3 UPTAKE RATIO: 32 % (ref 24–39)
T4 TOTAL: 7.8 ug/dL (ref 4.5–12.0)
TSH: 1.51 u[IU]/mL (ref 0.450–4.500)

## 2017-04-30 NOTE — Progress Notes (Signed)
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  INTERVAL HISTORY: Patient returns to clinic today for further evaluation and consideration of cycle 7 of Keytruda.  He has a persistent rash that is pruritic and not helped with OTC Benadryl.  He otherwise feels well. He has no neurologic complaints. He denies any recent fevers or illnesses.  He has a good appetite and denies weight loss.  He denies dysphagia, but occasionally has difficulty swallowing.  He has no chest pain or shortness of breath.  He has occasional cough particularly after eating.  He denies any nausea, vomiting, constipation, or diarrhea.  He has no urinary complaints.  Patient offers no further specific complaints today.  REVIEW OF SYSTEMS:   Review of Systems  Constitutional: Negative.  Negative for fever, malaise/fatigue and weight loss.  HENT: Negative.  Negative for sore throat.   Respiratory: Positive for cough. Negative for shortness of breath and stridor.   Cardiovascular: Negative.  Negative for chest pain and leg swelling.  Gastrointestinal: Negative.  Negative for abdominal pain, blood in stool and melena.  Genitourinary: Negative.  Negative for dysuria.  Musculoskeletal: Negative.   Skin: Positive for itching and rash.  Neurological: Negative.  Negative for sensory change, focal weakness and weakness.  Psychiatric/Behavioral: The patient is nervous/anxious.     As per HPI. Otherwise, a complete review of systems is negative.  PAST MEDICAL HISTORY: Past Medical History:  Diagnosis Date  . Cancer ([place])   . GERD (gastroesophageal reflux disease)   . Hypertension   . Thyroid disease     PAST SURGICAL HISTORY: Past Surgical History:  Procedure Laterality Date  .  tumor removed      FAMILY HISTORY: Family History  Problem Relation Age of Onset  . Cancer Mother   . Cancer Sister   . Cancer Maternal Aunt   . Cancer Paternal Uncle     ADVANCED DIRECTIVES (Y/N):  N  HEALTH MAINTENANCE: Social History   Tobacco Use  . Smoking status: Never Smoker  . Smokeless tobacco: Never Used  Substance Use Topics  . Alcohol use: No    Frequency: Never  . Drug use: No     Colonoscopy:  PAP:  Bone density:  Lipid panel:  No Known Allergies  Current Outpatient Medications  Medication Sig Dispense Refill  . acetaminophen (TYLENOL) 650 MG CR tablet Take 1,300 mg by mouth every 8 (eight) hours.    Marland Kitchen esomeprazole (NEXIUM) 40 MG capsule Take 1 capsule (40 mg total) by mouth daily. 30 capsule 2  . levothyroxine (SYNTHROID, LEVOTHROID) 125 MCG tablet Take 1 tablet (125 mcg total) by mouth daily. 30 tablet 2  . lisinopril (PRINIVIL,ZESTRIL) 5 MG tablet Take 5 mg by mouth daily.    . sildenafil (VIAGRA) 100 MG tablet Take 0.5-1 tablets (50-100 mg total) by mouth daily as needed for erectile dysfunction. 5 tablet 11  . SODIUM FLUORIDE, DENTAL RINSE, 0.05 % SOLN Use as directed in the mouth or throat.    . traMADol (ULTRAM) 50 MG tablet Take 1 tablet (50 mg total) by mouth every 8 (eight) hours as needed. 30 tablet 0  . predniSONE (STERAPRED UNI-PAK 21 TAB) 10 MG (21) TBPK tablet Taper as directed 21 tablet 0   No current facility-administered medications for this visit.  OBJECTIVE: Vitals:   05/05/17 1021  BP: 135/85  Pulse: 81  Resp: 20  Temp: 98.6 F (37 C)     Body mass index is 25.79 kg/m.    ECOG FS:0 - Asymptomatic  General: Well-developed, well-nourished, no acute distress. Eyes: Pink conjunctiva, anicteric sclera. HEENT: Normocephalic, moist mucous membranes, clear oropharnyx.  No palpable adenopathy. Lungs: Clear to auscultation bilaterally. Heart: Regular rate and rhythm. No rubs, murmurs, or gallops. Abdomen: Soft, nontender,  nondistended. No organomegaly noted, normoactive bowel sounds. Musculoskeletal: No edema, cyanosis, or clubbing. Neuro: Alert, answering all questions appropriately. Cranial nerves grossly intact. Skin: No rashes or petechiae noted. Psych: Normal affect.   LAB RESULTS:  Lab Results  Component Value Date   NA 137 05/05/2017   K 4.2 05/05/2017   CL 101 05/05/2017   CO2 28 05/05/2017   GLUCOSE 107 (H) 05/05/2017   BUN 18 05/05/2017   CREATININE 1.68 (H) 05/05/2017   CALCIUM 8.9 05/05/2017   PROT 7.2 05/05/2017   ALBUMIN 3.9 05/05/2017   AST 16 05/05/2017   ALT 11 (L) 05/05/2017   ALKPHOS 43 05/05/2017   BILITOT 0.7 05/05/2017   GFRNONAA 41 (L) 05/05/2017   GFRAA 47 (L) 05/05/2017    Lab Results  Component Value Date   WBC 4.9 05/05/2017   NEUTROABS 2.8 05/05/2017   HGB 16.1 05/05/2017   HCT 47.2 05/05/2017   MCV 95.4 05/05/2017   PLT 236 05/05/2017     STUDIES: Nm Pet Image Restag (ps) Skull Base To Thigh  Result Date: 04/12/2017 CLINICAL DATA:  Subsequent treatment strategy for squamous cell carcinoma of the tongue base. Last chemotherapy/radiation therapy more than 1 year ago. No current complaints. EXAM: NUCLEAR MEDICINE PET SKULL BASE TO THIGH TECHNIQUE: 12.0 mCi F-18 FDG was injected intravenously. Full-ring PET imaging was performed from the skull base to thigh after the radiotracer. CT data was obtained and used for attenuation correction and anatomic localization. Fasting blood glucose: 82 mg/dl Mediastinal blood pool activity: SUV max 2.75 COMPARISON:  None relevant.  Lumbar spine MRI 03/11/2008. FINDINGS: NECK: No hypermetabolic cervical lymph nodes are identified.There are no lesions of the pharyngeal mucosal space. Incidental CT findings: none CHEST: There are no hypermetabolic mediastinal, hilar or axillary lymph nodes. No suspicious pulmonary activity. Incidental CT findings: Mild dependent atelectasis at both lung bases. There is mild atherosclerosis of aorta,  great vessels and coronary arteries. ABDOMEN/PELVIS: There is no hypermetabolic activity within the liver, adrenal glands, spleen or pancreas. There is no hypermetabolic nodal activity. Incidental CT findings: There are small hepatic cysts, devoid of metabolic activity. These measure 16 mm in the dome of the right lobe (CT image 105) and 19 mm in the caudate lobe (image 137). Mild aortic and branch vessel atherosclerosis. SKELETON: There is no hypermetabolic activity to suggest osseous metastatic disease. Incidental CT findings: Mild lumbar spondylosis. Small lipoma anteriorly in the proximal right thigh. IMPRESSION: 1. No evidence of hypermetabolic local recurrence in the oral cavity. 2. No evidence of metastatic disease within the neck, chest, abdomen or pelvis. No suspicious metabolic activity. 3. Small hepatic cysts.  Aortic Atherosclerosis (ICD10-I70.0). Electronically Signed   By: Richardean Sale M.D.   On: 04/12/2017 14:26    Oncology history: Patient was originally diagnosed with squamous cell carcinoma of the base of tongue (stage T3 N3 M0, p16+) in October 2016.  He underwent concurrent chemotherapy and radiation with cisplatin starting in February 2017 and completing on May 21, 2015.  Follow-up PET scan on September 02, 2015 was reported as equivocal.  Repeat PET scan on December 07, 2015 reported abnormal FDG uptake, but this was thought to be reactive from prior radiation therapy.  PET scan on Jul 05, 2016 revealed a level 2 lymph node increasing in size and an SUV.  Biopsy in June 2019 confirmed recurrence.  Patient had several delays in treatment, but was finally scheduled for surgery on October 21, 2016.  Unfortunately, he was found that the recurrence was inoperable.  Patient initiated palliative Beryle Flock every 3 weeks on October 27, 2016.    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  PLAN:    1. 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  PET scan results from April 12, 2017 reviewed independently and reported as above with no evidence of recurrent or progressive disease.  Patient was previously given a referral to ENT to establish care.  Proceed with cycle 7 of Keytruda today.  Return to clinic in 3 weeks for further evaluation and consideration of cycle 8.  We will reimage in 4-6 months and plan to do one year of Keytruda if patient remains in remission. 2.  Rash: Unclear etiology.  It does not appear related to treatments.  Patient was given a Medrol Dosepak today. If steroids do not help resolve his symptoms, will consider referral to dermatology.  Approximately 30 minutes was spent in discussion of which greater than 50% was consultation.  Patient expressed understanding and was in agreement with this plan. He also understands that He can call clinic at any time with any questions, concerns, or complaints.   Cancer Staging No matching staging information was found for the patient.  Lloyd Huger, MD   05/07/2017 9:04 AM

## 2017-05-04 ENCOUNTER — Ambulatory Visit: Payer: Medicaid Other | Admitting: Family Medicine

## 2017-05-05 ENCOUNTER — Inpatient Hospital Stay (HOSPITAL_BASED_OUTPATIENT_CLINIC_OR_DEPARTMENT_OTHER): Payer: Medicaid Other | Admitting: Oncology

## 2017-05-05 ENCOUNTER — Inpatient Hospital Stay: Payer: Medicaid Other

## 2017-05-05 VITALS — BP 119/79 | HR 72 | Temp 95.1°F | Resp 20

## 2017-05-05 VITALS — BP 135/85 | HR 81 | Temp 98.6°F | Resp 20 | Wt 164.7 lb

## 2017-05-05 DIAGNOSIS — Z79899 Other long term (current) drug therapy: Secondary | ICD-10-CM | POA: Diagnosis not present

## 2017-05-05 DIAGNOSIS — K219 Gastro-esophageal reflux disease without esophagitis: Secondary | ICD-10-CM | POA: Diagnosis not present

## 2017-05-05 DIAGNOSIS — R21 Rash and other nonspecific skin eruption: Secondary | ICD-10-CM | POA: Diagnosis not present

## 2017-05-05 DIAGNOSIS — R1310 Dysphagia, unspecified: Secondary | ICD-10-CM | POA: Diagnosis not present

## 2017-05-05 DIAGNOSIS — R05 Cough: Secondary | ICD-10-CM

## 2017-05-05 DIAGNOSIS — C01 Malignant neoplasm of base of tongue: Secondary | ICD-10-CM

## 2017-05-05 DIAGNOSIS — I10 Essential (primary) hypertension: Secondary | ICD-10-CM | POA: Diagnosis not present

## 2017-05-05 DIAGNOSIS — I700 Atherosclerosis of aorta: Secondary | ICD-10-CM | POA: Diagnosis not present

## 2017-05-05 DIAGNOSIS — Z809 Family history of malignant neoplasm, unspecified: Secondary | ICD-10-CM

## 2017-05-05 DIAGNOSIS — Z5112 Encounter for antineoplastic immunotherapy: Secondary | ICD-10-CM

## 2017-05-05 DIAGNOSIS — E079 Disorder of thyroid, unspecified: Secondary | ICD-10-CM

## 2017-05-05 DIAGNOSIS — K7689 Other specified diseases of liver: Secondary | ICD-10-CM

## 2017-05-05 LAB — COMPREHENSIVE METABOLIC PANEL
ALT: 11 U/L — ABNORMAL LOW (ref 17–63)
AST: 16 U/L (ref 15–41)
Albumin: 3.9 g/dL (ref 3.5–5.0)
Alkaline Phosphatase: 43 U/L (ref 38–126)
Anion gap: 8 (ref 5–15)
BUN: 18 mg/dL (ref 6–20)
CHLORIDE: 101 mmol/L (ref 101–111)
CO2: 28 mmol/L (ref 22–32)
Calcium: 8.9 mg/dL (ref 8.9–10.3)
Creatinine, Ser: 1.68 mg/dL — ABNORMAL HIGH (ref 0.61–1.24)
GFR, EST AFRICAN AMERICAN: 47 mL/min — AB (ref >60)
GFR, EST NON AFRICAN AMERICAN: 41 mL/min — AB (ref >60)
Glucose, Bld: 107 mg/dL — ABNORMAL HIGH (ref 65–99)
POTASSIUM: 4.2 mmol/L (ref 3.5–5.1)
Sodium: 137 mmol/L (ref 135–145)
Total Bilirubin: 0.7 mg/dL (ref 0.3–1.2)
Total Protein: 7.2 g/dL (ref 6.5–8.1)

## 2017-05-05 LAB — CBC WITH DIFFERENTIAL/PLATELET
Basophils Absolute: 0 10*3/uL (ref 0–0.1)
Basophils Relative: 1 %
Eosinophils Absolute: 0.3 10*3/uL (ref 0–0.7)
Eosinophils Relative: 6 %
HEMATOCRIT: 47.2 % (ref 40.0–52.0)
HEMOGLOBIN: 16.1 g/dL (ref 13.0–18.0)
LYMPHS ABS: 1.1 10*3/uL (ref 1.0–3.6)
LYMPHS PCT: 23 %
MCH: 32.6 pg (ref 26.0–34.0)
MCHC: 34.1 g/dL (ref 32.0–36.0)
MCV: 95.4 fL (ref 80.0–100.0)
Monocytes Absolute: 0.7 10*3/uL (ref 0.2–1.0)
Monocytes Relative: 13 %
NEUTROS ABS: 2.8 10*3/uL (ref 1.4–6.5)
NEUTROS PCT: 57 %
Platelets: 236 10*3/uL (ref 150–440)
RBC: 4.95 MIL/uL (ref 4.40–5.90)
RDW: 14 % (ref 11.5–14.5)
WBC: 4.9 10*3/uL (ref 3.8–10.6)

## 2017-05-05 MED ORDER — SODIUM CHLORIDE 0.9 % IV SOLN
Freq: Once | INTRAVENOUS | Status: AC
  Administered 2017-05-05: 11:00:00 via INTRAVENOUS
  Filled 2017-05-05: qty 1000

## 2017-05-05 MED ORDER — PEMBROLIZUMAB CHEMO INJECTION 100 MG/4ML
200.0000 mg | Freq: Once | INTRAVENOUS | Status: AC
  Administered 2017-05-05: 200 mg via INTRAVENOUS
  Filled 2017-05-05 (×2): qty 8

## 2017-05-05 MED ORDER — PREDNISONE 10 MG (21) PO TBPK: ORAL_TABLET | ORAL | 0 refills | Status: DC

## 2017-05-05 NOTE — Patient Instructions (Signed)
Pembrolizumab injection  What is this medicine?  PEMBROLIZUMAB (pem broe liz ue mab) is a monoclonal antibody. It is used to treat melanoma, head and neck cancer, Hodgkin lymphoma, non-small cell lung cancer, urothelial cancer, stomach cancer, and cancers that have a certain genetic condition.  This medicine may be used for other purposes; ask your health care provider or pharmacist if you have questions.  COMMON BRAND NAME(S): Keytruda  What should I tell my health care provider before I take this medicine?  They need to know if you have any of these conditions:  -diabetes  -immune system problems  -inflammatory bowel disease  -liver disease  -lung or breathing disease  -lupus  -organ transplant  -an unusual or allergic reaction to pembrolizumab, other medicines, foods, dyes, or preservatives  -pregnant or trying to get pregnant  -breast-feeding  How should I use this medicine?  This medicine is for infusion into a vein. It is given by a health care professional in a hospital or clinic setting.  A special MedGuide will be given to you before each treatment. Be sure to read this information carefully each time.  Talk to your pediatrician regarding the use of this medicine in children. While this drug may be prescribed for selected conditions, precautions do apply.  Overdosage: If you think you have taken too much of this medicine contact a poison control center or emergency room at once.  NOTE: This medicine is only for you. Do not share this medicine with others.  What if I miss a dose?  It is important not to miss your dose. Call your doctor or health care professional if you are unable to keep an appointment.  What may interact with this medicine?  Interactions have not been studied.  Give your health care provider a list of all the medicines, herbs, non-prescription drugs, or dietary supplements you use. Also tell them if you smoke, drink alcohol, or use illegal drugs. Some items may interact with your  medicine.  This list may not describe all possible interactions. Give your health care provider a list of all the medicines, herbs, non-prescription drugs, or dietary supplements you use. Also tell them if you smoke, drink alcohol, or use illegal drugs. Some items may interact with your medicine.  What should I watch for while using this medicine?  Your condition will be monitored carefully while you are receiving this medicine.  You may need blood work done while you are taking this medicine.  Do not become pregnant while taking this medicine or for 4 months after stopping it. Women should inform their doctor if they wish to become pregnant or think they might be pregnant. There is a potential for serious side effects to an unborn child. Talk to your health care professional or pharmacist for more information. Do not breast-feed an infant while taking this medicine or for 4 months after the last dose.  What side effects may I notice from receiving this medicine?  Side effects that you should report to your doctor or health care professional as soon as possible:  -allergic reactions like skin rash, itching or hives, swelling of the face, lips, or tongue  -bloody or black, tarry  -breathing problems  -changes in vision  -chest pain  -chills  -constipation  -cough  -dizziness or feeling faint or lightheaded  -fast or irregular heartbeat  -fever  -flushing  -hair loss  -low blood counts - this medicine may decrease the number of white blood cells, red blood cells   and platelets. You may be at increased risk for infections and bleeding.  -muscle pain  -muscle weakness  -persistent headache  -signs and symptoms of high blood sugar such as dizziness; dry mouth; dry skin; fruity breath; nausea; stomach pain; increased hunger or thirst; increased urination  -signs and symptoms of kidney injury like trouble passing urine or change in the amount of urine  -signs and symptoms of liver injury like dark urine, light-colored  stools, loss of appetite, nausea, right upper belly pain, yellowing of the eyes or skin  -stomach pain  -sweating  -weight loss  Side effects that usually do not require medical attention (report to your doctor or health care professional if they continue or are bothersome):  -decreased appetite  -diarrhea  -tiredness  This list may not describe all possible side effects. Call your doctor for medical advice about side effects. You may report side effects to FDA at 1-800-FDA-1088.  Where should I keep my medicine?  This drug is given in a hospital or clinic and will not be stored at home.  NOTE: This sheet is a summary. It may not cover all possible information. If you have questions about this medicine, talk to your doctor, pharmacist, or health care provider.   2018 Elsevier/Gold Standard (2015-11-10 12:29:36)

## 2017-05-05 NOTE — Progress Notes (Signed)
Patient reports rash and itching to arms, back and chest. Patient has tried Benadryl without relief.

## 2017-05-06 LAB — THYROID PANEL WITH TSH
Free Thyroxine Index: 2.7 (ref 1.2–4.9)
T3 Uptake Ratio: 32 % (ref 24–39)
T4, Total: 8.5 ug/dL (ref 4.5–12.0)
TSH: 0.618 u[IU]/mL (ref 0.450–4.500)

## 2017-05-16 ENCOUNTER — Ambulatory Visit: Payer: Medicaid Other | Admitting: Family Medicine

## 2017-05-17 ENCOUNTER — Encounter: Payer: Self-pay | Admitting: Family Medicine

## 2017-05-17 ENCOUNTER — Ambulatory Visit (INDEPENDENT_AMBULATORY_CARE_PROVIDER_SITE_OTHER): Payer: Medicaid Other | Admitting: Family Medicine

## 2017-05-17 VITALS — BP 113/79 | HR 110 | Temp 99.8°F | Resp 16 | Ht 67.0 in | Wt 168.0 lb

## 2017-05-17 DIAGNOSIS — C01 Malignant neoplasm of base of tongue: Secondary | ICD-10-CM

## 2017-05-17 DIAGNOSIS — K219 Gastro-esophageal reflux disease without esophagitis: Secondary | ICD-10-CM | POA: Diagnosis not present

## 2017-05-17 DIAGNOSIS — E890 Postprocedural hypothyroidism: Secondary | ICD-10-CM | POA: Diagnosis not present

## 2017-05-17 DIAGNOSIS — J40 Bronchitis, not specified as acute or chronic: Secondary | ICD-10-CM | POA: Diagnosis not present

## 2017-05-17 DIAGNOSIS — N183 Chronic kidney disease, stage 3 unspecified: Secondary | ICD-10-CM

## 2017-05-17 MED ORDER — PROMETHAZINE-CODEINE 6.25-10 MG/5ML PO SYRP: 5.0000 mL | ORAL_SOLUTION | Freq: Four times a day (QID) | ORAL | 0 refills | Status: DC | PRN

## 2017-05-17 MED ORDER — DOXYCYCLINE HYCLATE 100 MG PO TABS: 100.0000 mg | ORAL_TABLET | Freq: Two times a day (BID) | ORAL | 0 refills | Status: DC

## 2017-05-17 NOTE — Progress Notes (Signed)
Date:  05/17/2017   Name:  Troy [name]   DOB:  02-Jun-1950   MRN:  [id number]  PCP:  Adline Potter, MD    Chief Complaint: Cough (started 2 days ago Also need sodium chloride mouth wash refilled )   History of Present Illness:  This is a 67 y.o. male seen for two month f/u from initial visit. On Keytruda per oncology for SCC tongue. On increased dose Synthroid, TSH ok last month. C/o 2d hx cough, productive gray phlegm this AM, sore throat, rhinorrhea. GERD ok on Nexium.  Review of Systems:  Review of Systems  Cardiovascular: Negative for chest pain and leg swelling.  Gastrointestinal: Negative for abdominal pain.  Genitourinary: Negative for difficulty urinating.  Neurological: Negative for syncope and light-headedness.    Patient Active Problem List   Diagnosis Date Noted  . Goals of care, counseling/discussion 04/07/2017  . CKD (chronic kidney disease) stage 3, GFR 30-59 ml/min (HCC) 04/03/2017  . Hypothyroidism 03/31/2017  . ED (erectile dysfunction) 03/31/2017  . GERD (gastroesophageal reflux disease) 09/23/2016  . Hypertension 09/23/2016  . Squamous cell carcinoma of base of tongue ([place]) 08/04/2016    Prior to Admission medications   Medication Sig Start Date End Date Taking? Authorizing Provider  acetaminophen (TYLENOL) 650 MG CR tablet Take 1,300 mg by mouth every 8 (eight) hours.   Yes [provider]  esomeprazole (NEXIUM) 40 MG capsule Take 1 capsule (40 mg total) by mouth daily. 04/05/17  Yes Jaclyn Carew, Gwyndolyn Saxon, MD  levothyroxine (SYNTHROID, LEVOTHROID) 125 MCG tablet Take 1 tablet (125 mcg total) by mouth daily. 04/03/17  Yes Christene Pounds, Gwyndolyn Saxon, MD  lisinopril (PRINIVIL,ZESTRIL) 5 MG tablet Take 5 mg by mouth daily.   Yes [provider]  sildenafil (VIAGRA) 100 MG tablet Take 0.5-1 tablets (50-100 mg total) by mouth daily as needed for erectile dysfunction. 03/31/17  Yes Tamotsu Wiederholt, Gwyndolyn Saxon, MD  SODIUM FLUORIDE, DENTAL RINSE, 0.05 % SOLN Use as directed in the  mouth or throat.   Yes [provider]  traMADol (ULTRAM) 50 MG tablet Take 1 tablet (50 mg total) by mouth every 8 (eight) hours as needed. 03/31/17  Yes Shane Melby, Gwyndolyn Saxon, MD  doxycycline (VIBRA-TABS) 100 MG tablet Take 1 tablet (100 mg total) by mouth 2 (two) times daily. 05/17/17   Maliah Pyles, Gwyndolyn Saxon, MD  promethazine-codeine (PHENERGAN WITH CODEINE) 6.25-10 MG/5ML syrup Take 5 mLs by mouth every 6 (six) hours as needed for cough. 05/17/17   Adline Potter, MD    No Known Allergies  Past Surgical History:  Procedure Laterality Date  . tumor removed      Social History   Tobacco Use  . Smoking status: Never Smoker  . Smokeless tobacco: Never Used  Substance Use Topics  . Alcohol use: No    Frequency: Never  . Drug use: No    Family History  Problem Relation Age of Onset  . Cancer Mother   . Cancer Sister   . Cancer Maternal Aunt   . Cancer Paternal Uncle     Medication list has been reviewed and updated.  Physical Examination: BP 113/79   Pulse (!) 110   Temp 99.8 F (37.7 C)   Resp 16   Ht 5\' 7"  (1.702 m)   Wt 168 lb (76.2 kg)   SpO2 97%   BMI 26.31 kg/m   Physical Exam  Constitutional: He appears well-developed and well-nourished.  Cardiovascular: Normal rate, regular rhythm and normal heart sounds.  Pulmonary/Chest: Effort normal and breath sounds normal.  Musculoskeletal: He exhibits no edema.  Neurological: He is alert.  Skin: Skin is warm and dry.  Psychiatric: He has a normal mood and affect. His behavior is normal.  Nursing note and vitals reviewed.   Assessment and Plan:  1. Squamous cell carcinoma of base of tongue (HCC) On Keytruda, oncology following  2. CKD (chronic kidney disease) stage 3, GFR 30-59 ml/min (HCC) Stable on lisinopril  3. Bronchitis Will cover given fever and underlying malignancy, doxy x 5d, Phenergan with codeine prn  4. Gastroesophageal reflux disease, esophagitis presence not specified Stable on Nexium  5.  Postoperative hypothyroidism Well controlled on increased Synthroid  Return if symptoms worsen or fail to improve.  Satira Anis. [facility]  05/17/2017

## 2017-05-21 NOTE — Progress Notes (Signed)
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  INTERVAL HISTORY: Patient returns to clinic today for further evaluation and consideration of cycle 8 of Keytruda.  His rash resolved with a Medrol Dosepak.  He currently feels well and is asymptomatic. He has no neurologic complaints. He denies any recent fevers or illnesses.  He has a good appetite and denies weight loss.  He denies dysphagia, but occasionally has difficulty swallowing.  He denies any chest pain, cough, hemoptysis, or shortness of breath. He denies any nausea, vomiting, constipation, or diarrhea.  He has no urinary complaints.  Patient offers no specific complaints today.  REVIEW OF SYSTEMS:   Review of Systems  Constitutional: Negative.  Negative for fever, malaise/fatigue and weight loss.  HENT: Negative.  Negative for sore throat.   Respiratory: Negative.  Negative for cough, shortness of breath and stridor.   Cardiovascular: Negative.  Negative for chest pain and leg swelling.  Gastrointestinal: Negative.  Negative for abdominal pain, blood in stool and melena.  Genitourinary: Negative.  Negative for dysuria.  Musculoskeletal: Negative.   Skin: Negative.  Negative for itching and rash.  Neurological: Negative.  Negative for sensory change, focal weakness and weakness.  Psychiatric/Behavioral: The patient is nervous/anxious.     As per HPI. Otherwise, a complete review of systems is negative.  PAST MEDICAL HISTORY: Past Medical History:  Diagnosis Date  . Cancer ([place])   . GERD (gastroesophageal reflux disease)   . Hypertension   . Thyroid disease     PAST SURGICAL HISTORY: Past Surgical History:  Procedure Laterality Date  . tumor removed      FAMILY  HISTORY: Family History  Problem Relation Age of Onset  . Cancer Mother   . Cancer Sister   . Cancer Maternal Aunt   . Cancer Paternal Uncle     ADVANCED DIRECTIVES (Y/N):  N  HEALTH MAINTENANCE: Social History   Tobacco Use  . Smoking status: Never Smoker  . Smokeless tobacco: Never Used  Substance Use Topics  . Alcohol use: No    Frequency: Never  . Drug use: No     Colonoscopy:  PAP:  Bone density:  Lipid panel:  No Known Allergies  Current Outpatient Medications  Medication Sig Dispense Refill  . acetaminophen (TYLENOL) 650 MG CR tablet Take 1,300 mg by mouth every 8 (eight) hours.    Marland Kitchen doxycycline (VIBRA-TABS) 100 MG tablet Take 1 tablet (100 mg total) by mouth 2 (two) times daily. 10 tablet 0  . esomeprazole (NEXIUM) 40 MG capsule Take 1 capsule (40 mg total) by mouth daily. 30 capsule 2  . levothyroxine (SYNTHROID, LEVOTHROID) 125 MCG tablet Take 1 tablet (125 mcg total) by mouth daily. 30 tablet 2  . lisinopril (PRINIVIL,ZESTRIL) 5 MG tablet Take 5 mg by mouth daily.    . promethazine-codeine (PHENERGAN WITH CODEINE) 6.25-10 MG/5ML syrup Take 5 mLs by mouth every 6 (six) hours as needed for cough. 120 mL 0  . sildenafil (VIAGRA) 100 MG tablet Take 0.5-1 tablets (50-100 mg total) by mouth daily as needed for erectile dysfunction. 5 tablet 11  . SODIUM FLUORIDE, DENTAL RINSE, 0.05 % SOLN Use as directed in the mouth or throat.    . traMADol (ULTRAM) 50 MG tablet Take 1 tablet (50 mg total) by mouth  every 8 (eight) hours as needed. 30 tablet 0   No current facility-administered medications for this visit.     OBJECTIVE: Vitals:   05/26/17 0925  BP: 116/77  Pulse: 80  Resp: 18  Temp: (!) 97.4 F (36.3 C)     Body mass index is 25.84 kg/m.    ECOG FS:0 - Asymptomatic  General: Well-developed, well-nourished, no acute distress. Eyes: Pink conjunctiva, anicteric sclera. HEENT: Normocephalic, moist mucous membranes, clear oropharnyx.  No palpable  lymphadenopathy. Lungs: Clear to auscultation bilaterally. Heart: Regular rate and rhythm. No rubs, murmurs, or gallops. Abdomen: Soft, nontender, nondistended. No organomegaly noted, normoactive bowel sounds. Musculoskeletal: No edema, cyanosis, or clubbing. Neuro: Alert, answering all questions appropriately. Cranial nerves grossly intact. Skin: No rashes or petechiae noted. Psych: Normal affect.  LAB RESULTS:  Lab Results  Component Value Date   NA 137 05/26/2017   K 3.9 05/26/2017   CL 105 05/26/2017   CO2 27 05/26/2017   GLUCOSE 104 (H) 05/26/2017   BUN 15 05/26/2017   CREATININE 1.61 (H) 05/26/2017   CALCIUM 8.7 (L) 05/26/2017   PROT 6.8 05/26/2017   ALBUMIN 3.5 05/26/2017   AST 17 05/26/2017   ALT 12 (L) 05/26/2017   ALKPHOS 40 05/26/2017   BILITOT 0.7 05/26/2017   GFRNONAA 43 (L) 05/26/2017   GFRAA 50 (L) 05/26/2017    Lab Results  Component Value Date   WBC 4.8 05/26/2017   NEUTROABS 2.8 05/26/2017   HGB 14.8 05/26/2017   HCT 43.9 05/26/2017   MCV 95.2 05/26/2017   PLT 260 05/26/2017     STUDIES: No results found.  Oncology history: Patient was originally diagnosed with squamous cell carcinoma of the base of tongue (stage T3 N3 M0, p16+) in October 2016.  He underwent concurrent chemotherapy and radiation with cisplatin starting in February 2017 and completing on May 21, 2015.  Follow-up PET scan on September 02, 2015 was reported as equivocal.  Repeat PET scan on December 07, 2015 reported abnormal FDG uptake, but this was thought to be reactive from prior radiation therapy.  PET scan on Jul 05, 2016 revealed a level 2 lymph node increasing in size and an SUV.  Biopsy in June 2019 confirmed recurrence.  Patient had several delays in treatment, but was finally scheduled for surgery on October 21, 2016.  Unfortunately, he was found that the recurrence was inoperable.  Patient initiated palliative Beryle Flock every 3 weeks on October 27, 2016.    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  PLAN:    1. 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  PET scan results from April 12, 2017 reviewed independently with no evidence of recurrent or progressive disease.  Patient was previously given a referral to ENT to establish care.  Proceed with cycle 8 of Keytruda today.  Return to clinic in 3 weeks for consideration of cycle 9 and then in 6 weeks for further evaluation and consideration of cycle 10.  We will reimage in 4-6 months and plan to do one year of Keytruda if patient remains in remission. 2.  Rash: Resolved with Medrol Dosepak.  Possibly a contact dermatitis, monitor. 3.  Renal insufficiency: Mild, but patient's creatinine appears to be at his baseline.  Monitor.   Patient expressed understanding and was in agreement with this plan. He also understands that He can call clinic at any time with any questions, concerns, or complaints.   Cancer Staging No matching  staging information was found for the patient.  Lloyd Huger, MD   05/28/2017 8:56 AM

## 2017-05-25 ENCOUNTER — Ambulatory Visit: Payer: Medicaid Other | Admitting: Family Medicine

## 2017-05-26 ENCOUNTER — Encounter: Payer: Self-pay | Admitting: Oncology

## 2017-05-26 ENCOUNTER — Inpatient Hospital Stay: Payer: Medicaid Other

## 2017-05-26 ENCOUNTER — Inpatient Hospital Stay: Payer: Medicaid Other | Attending: Oncology | Admitting: Oncology

## 2017-05-26 VITALS — BP 118/78 | HR 76 | Resp 18

## 2017-05-26 VITALS — BP 116/77 | HR 80 | Temp 97.4°F | Resp 18 | Wt 165.0 lb

## 2017-05-26 DIAGNOSIS — Z803 Family history of malignant neoplasm of breast: Secondary | ICD-10-CM

## 2017-05-26 DIAGNOSIS — Z79899 Other long term (current) drug therapy: Secondary | ICD-10-CM | POA: Insufficient documentation

## 2017-05-26 DIAGNOSIS — E039 Hypothyroidism, unspecified: Secondary | ICD-10-CM | POA: Diagnosis not present

## 2017-05-26 DIAGNOSIS — Z5111 Encounter for antineoplastic chemotherapy: Secondary | ICD-10-CM

## 2017-05-26 DIAGNOSIS — N289 Disorder of kidney and ureter, unspecified: Secondary | ICD-10-CM | POA: Insufficient documentation

## 2017-05-26 DIAGNOSIS — R1310 Dysphagia, unspecified: Secondary | ICD-10-CM | POA: Diagnosis not present

## 2017-05-26 DIAGNOSIS — Z809 Family history of malignant neoplasm, unspecified: Secondary | ICD-10-CM | POA: Diagnosis not present

## 2017-05-26 DIAGNOSIS — K219 Gastro-esophageal reflux disease without esophagitis: Secondary | ICD-10-CM | POA: Diagnosis not present

## 2017-05-26 DIAGNOSIS — C01 Malignant neoplasm of base of tongue: Secondary | ICD-10-CM | POA: Diagnosis not present

## 2017-05-26 DIAGNOSIS — Z7984 Long term (current) use of oral hypoglycemic drugs: Secondary | ICD-10-CM | POA: Diagnosis not present

## 2017-05-26 DIAGNOSIS — R21 Rash and other nonspecific skin eruption: Secondary | ICD-10-CM | POA: Diagnosis not present

## 2017-05-26 DIAGNOSIS — N2889 Other specified disorders of kidney and ureter: Secondary | ICD-10-CM | POA: Diagnosis not present

## 2017-05-26 DIAGNOSIS — I10 Essential (primary) hypertension: Secondary | ICD-10-CM | POA: Diagnosis not present

## 2017-05-26 LAB — COMPREHENSIVE METABOLIC PANEL
ALBUMIN: 3.5 g/dL (ref 3.5–5.0)
ALT: 12 U/L — ABNORMAL LOW (ref 17–63)
ANION GAP: 5 (ref 5–15)
AST: 17 U/L (ref 15–41)
Alkaline Phosphatase: 40 U/L (ref 38–126)
BUN: 15 mg/dL (ref 6–20)
CO2: 27 mmol/L (ref 22–32)
Calcium: 8.7 mg/dL — ABNORMAL LOW (ref 8.9–10.3)
Chloride: 105 mmol/L (ref 101–111)
Creatinine, Ser: 1.61 mg/dL — ABNORMAL HIGH (ref 0.61–1.24)
GFR calc non Af Amer: 43 mL/min — ABNORMAL LOW (ref >60)
GFR, EST AFRICAN AMERICAN: 50 mL/min — AB (ref >60)
Glucose, Bld: 104 mg/dL — ABNORMAL HIGH (ref 65–99)
POTASSIUM: 3.9 mmol/L (ref 3.5–5.1)
SODIUM: 137 mmol/L (ref 135–145)
Total Bilirubin: 0.7 mg/dL (ref 0.3–1.2)
Total Protein: 6.8 g/dL (ref 6.5–8.1)

## 2017-05-26 LAB — CBC WITH DIFFERENTIAL/PLATELET
BASOS PCT: 1 %
Basophils Absolute: 0 10*3/uL (ref 0–0.1)
Eosinophils Absolute: 0.2 10*3/uL (ref 0–0.7)
Eosinophils Relative: 4 %
HEMATOCRIT: 43.9 % (ref 40.0–52.0)
HEMOGLOBIN: 14.8 g/dL (ref 13.0–18.0)
LYMPHS ABS: 1.2 10*3/uL (ref 1.0–3.6)
LYMPHS PCT: 24 %
MCH: 32 pg (ref 26.0–34.0)
MCHC: 33.7 g/dL (ref 32.0–36.0)
MCV: 95.2 fL (ref 80.0–100.0)
MONOS PCT: 13 %
Monocytes Absolute: 0.6 10*3/uL (ref 0.2–1.0)
NEUTROS ABS: 2.8 10*3/uL (ref 1.4–6.5)
NEUTROS PCT: 58 %
Platelets: 260 10*3/uL (ref 150–440)
RBC: 4.61 MIL/uL (ref 4.40–5.90)
RDW: 13.2 % (ref 11.5–14.5)
WBC: 4.8 10*3/uL (ref 3.8–10.6)

## 2017-05-26 MED ORDER — SODIUM CHLORIDE 0.9 % IV SOLN
200.0000 mg | Freq: Once | INTRAVENOUS | Status: AC
  Administered 2017-05-26: 200 mg via INTRAVENOUS
  Filled 2017-05-26 (×2): qty 8

## 2017-05-26 MED ORDER — SODIUM CHLORIDE 0.9 % IV SOLN
Freq: Once | INTRAVENOUS | Status: AC
  Administered 2017-05-26: 10:00:00 via INTRAVENOUS
  Filled 2017-05-26: qty 1000

## 2017-05-26 NOTE — Patient Instructions (Signed)
Pembrolizumab injection  What is this medicine?  PEMBROLIZUMAB (pem broe liz ue mab) is a monoclonal antibody. It is used to treat melanoma, head and neck cancer, Hodgkin lymphoma, non-small cell lung cancer, urothelial cancer, stomach cancer, and cancers that have a certain genetic condition.  This medicine may be used for other purposes; ask your health care provider or pharmacist if you have questions.  COMMON BRAND NAME(S): Keytruda  What should I tell my health care provider before I take this medicine?  They need to know if you have any of these conditions:  -diabetes  -immune system problems  -inflammatory bowel disease  -liver disease  -lung or breathing disease  -lupus  -organ transplant  -an unusual or allergic reaction to pembrolizumab, other medicines, foods, dyes, or preservatives  -pregnant or trying to get pregnant  -breast-feeding  How should I use this medicine?  This medicine is for infusion into a vein. It is given by a health care professional in a hospital or clinic setting.  A special MedGuide will be given to you before each treatment. Be sure to read this information carefully each time.  Talk to your pediatrician regarding the use of this medicine in children. While this drug may be prescribed for selected conditions, precautions do apply.  Overdosage: If you think you have taken too much of this medicine contact a poison control center or emergency room at once.  NOTE: This medicine is only for you. Do not share this medicine with others.  What if I miss a dose?  It is important not to miss your dose. Call your doctor or health care professional if you are unable to keep an appointment.  What may interact with this medicine?  Interactions have not been studied.  Give your health care provider a list of all the medicines, herbs, non-prescription drugs, or dietary supplements you use. Also tell them if you smoke, drink alcohol, or use illegal drugs. Some items may interact with your  medicine.  This list may not describe all possible interactions. Give your health care provider a list of all the medicines, herbs, non-prescription drugs, or dietary supplements you use. Also tell them if you smoke, drink alcohol, or use illegal drugs. Some items may interact with your medicine.  What should I watch for while using this medicine?  Your condition will be monitored carefully while you are receiving this medicine.  You may need blood work done while you are taking this medicine.  Do not become pregnant while taking this medicine or for 4 months after stopping it. Women should inform their doctor if they wish to become pregnant or think they might be pregnant. There is a potential for serious side effects to an unborn child. Talk to your health care professional or pharmacist for more information. Do not breast-feed an infant while taking this medicine or for 4 months after the last dose.  What side effects may I notice from receiving this medicine?  Side effects that you should report to your doctor or health care professional as soon as possible:  -allergic reactions like skin rash, itching or hives, swelling of the face, lips, or tongue  -bloody or black, tarry  -breathing problems  -changes in vision  -chest pain  -chills  -constipation  -cough  -dizziness or feeling faint or lightheaded  -fast or irregular heartbeat  -fever  -flushing  -hair loss  -low blood counts - this medicine may decrease the number of white blood cells, red blood cells   and platelets. You may be at increased risk for infections and bleeding.  -muscle pain  -muscle weakness  -persistent headache  -signs and symptoms of high blood sugar such as dizziness; dry mouth; dry skin; fruity breath; nausea; stomach pain; increased hunger or thirst; increased urination  -signs and symptoms of kidney injury like trouble passing urine or change in the amount of urine  -signs and symptoms of liver injury like dark urine, light-colored  stools, loss of appetite, nausea, right upper belly pain, yellowing of the eyes or skin  -stomach pain  -sweating  -weight loss  Side effects that usually do not require medical attention (report to your doctor or health care professional if they continue or are bothersome):  -decreased appetite  -diarrhea  -tiredness  This list may not describe all possible side effects. Call your doctor for medical advice about side effects. You may report side effects to FDA at 1-800-FDA-1088.  Where should I keep my medicine?  This drug is given in a hospital or clinic and will not be stored at home.  NOTE: This sheet is a summary. It may not cover all possible information. If you have questions about this medicine, talk to your doctor, pharmacist, or health care provider.   2018 Elsevier/Gold Standard (2015-11-10 12:29:36)

## 2017-05-26 NOTE — Progress Notes (Signed)
Patient denies any concerns today.  

## 2017-05-30 ENCOUNTER — Ambulatory Visit: Payer: Medicaid Other | Admitting: Family Medicine

## 2017-05-31 ENCOUNTER — Other Ambulatory Visit: Payer: Self-pay | Admitting: Family Medicine

## 2017-05-31 MED ORDER — LISINOPRIL 5 MG PO TABS: 5.0000 mg | ORAL_TABLET | Freq: Every day | ORAL | 3 refills | Status: DC

## 2017-05-31 MED ORDER — LEVOTHYROXINE SODIUM 125 MCG PO TABS: 125.0000 ug | ORAL_TABLET | Freq: Every day | ORAL | 3 refills | Status: DC

## 2017-06-16 ENCOUNTER — Inpatient Hospital Stay: Payer: Medicaid Other

## 2017-06-16 ENCOUNTER — Inpatient Hospital Stay: Payer: Medicaid Other | Attending: Oncology

## 2017-06-16 VITALS — BP 119/75 | HR 78 | Temp 96.8°F | Resp 18

## 2017-06-16 DIAGNOSIS — Z5112 Encounter for antineoplastic immunotherapy: Secondary | ICD-10-CM | POA: Diagnosis present

## 2017-06-16 DIAGNOSIS — I10 Essential (primary) hypertension: Secondary | ICD-10-CM | POA: Insufficient documentation

## 2017-06-16 DIAGNOSIS — N289 Disorder of kidney and ureter, unspecified: Secondary | ICD-10-CM | POA: Insufficient documentation

## 2017-06-16 DIAGNOSIS — G4700 Insomnia, unspecified: Secondary | ICD-10-CM | POA: Diagnosis not present

## 2017-06-16 DIAGNOSIS — Z809 Family history of malignant neoplasm, unspecified: Secondary | ICD-10-CM | POA: Diagnosis not present

## 2017-06-16 DIAGNOSIS — K219 Gastro-esophageal reflux disease without esophagitis: Secondary | ICD-10-CM | POA: Insufficient documentation

## 2017-06-16 DIAGNOSIS — C01 Malignant neoplasm of base of tongue: Secondary | ICD-10-CM

## 2017-06-16 DIAGNOSIS — Z79899 Other long term (current) drug therapy: Secondary | ICD-10-CM | POA: Insufficient documentation

## 2017-06-16 DIAGNOSIS — E079 Disorder of thyroid, unspecified: Secondary | ICD-10-CM | POA: Insufficient documentation

## 2017-06-16 LAB — CBC WITH DIFFERENTIAL/PLATELET
BASOS ABS: 0 10*3/uL (ref 0–0.1)
Basophils Relative: 1 %
EOS ABS: 0.1 10*3/uL (ref 0–0.7)
EOS PCT: 2 %
HCT: 45.2 % (ref 40.0–52.0)
HEMOGLOBIN: 15 g/dL (ref 13.0–18.0)
LYMPHS ABS: 0.9 10*3/uL — AB (ref 1.0–3.6)
Lymphocytes Relative: 21 %
MCH: 31.8 pg (ref 26.0–34.0)
MCHC: 33.2 g/dL (ref 32.0–36.0)
MCV: 95.7 fL (ref 80.0–100.0)
Monocytes Absolute: 0.7 10*3/uL (ref 0.2–1.0)
Monocytes Relative: 15 %
NEUTROS PCT: 61 %
Neutro Abs: 2.7 10*3/uL (ref 1.4–6.5)
Platelets: 216 10*3/uL (ref 150–440)
RBC: 4.72 MIL/uL (ref 4.40–5.90)
RDW: 13.8 % (ref 11.5–14.5)
WBC: 4.4 10*3/uL (ref 3.8–10.6)

## 2017-06-16 LAB — COMPREHENSIVE METABOLIC PANEL
ALT: 13 U/L — ABNORMAL LOW (ref 17–63)
ANION GAP: 8 (ref 5–15)
AST: 22 U/L (ref 15–41)
Albumin: 3.6 g/dL (ref 3.5–5.0)
Alkaline Phosphatase: 38 U/L (ref 38–126)
BUN: 13 mg/dL (ref 6–20)
CHLORIDE: 106 mmol/L (ref 101–111)
CO2: 25 mmol/L (ref 22–32)
CREATININE: 1.7 mg/dL — AB (ref 0.61–1.24)
Calcium: 8.6 mg/dL — ABNORMAL LOW (ref 8.9–10.3)
GFR, EST AFRICAN AMERICAN: 47 mL/min — AB (ref >60)
GFR, EST NON AFRICAN AMERICAN: 40 mL/min — AB (ref >60)
Glucose, Bld: 104 mg/dL — ABNORMAL HIGH (ref 65–99)
Potassium: 3.6 mmol/L (ref 3.5–5.1)
SODIUM: 139 mmol/L (ref 135–145)
TOTAL PROTEIN: 6.7 g/dL (ref 6.5–8.1)
Total Bilirubin: 0.5 mg/dL (ref 0.3–1.2)

## 2017-06-16 MED ORDER — SODIUM CHLORIDE 0.9 % IV SOLN
Freq: Once | INTRAVENOUS | Status: AC
Start: 2017-06-16 — End: 2017-06-16
  Administered 2017-06-16: 11:00:00 via INTRAVENOUS
  Filled 2017-06-16: qty 1000

## 2017-06-16 MED ORDER — SODIUM CHLORIDE 0.9 % IV SOLN
200.0000 mg | Freq: Once | INTRAVENOUS | Status: AC
  Administered 2017-06-16: 200 mg via INTRAVENOUS
  Filled 2017-06-16 (×2): qty 8

## 2017-06-17 LAB — THYROID PANEL WITH TSH
FREE THYROXINE INDEX: 1.8 (ref 1.2–4.9)
T3 UPTAKE RATIO: 27 % (ref 24–39)
T4, Total: 6.7 ug/dL (ref 4.5–12.0)
TSH: 1.88 u[IU]/mL (ref 0.450–4.500)

## 2017-06-22 ENCOUNTER — Ambulatory Visit (INDEPENDENT_AMBULATORY_CARE_PROVIDER_SITE_OTHER): Payer: Medicaid Other | Admitting: Family Medicine

## 2017-06-22 ENCOUNTER — Encounter: Payer: Self-pay | Admitting: Family Medicine

## 2017-06-22 VITALS — BP 112/70 | HR 80 | Ht 67.0 in | Wt 168.0 lb

## 2017-06-22 DIAGNOSIS — N183 Chronic kidney disease, stage 3 unspecified: Secondary | ICD-10-CM

## 2017-06-22 DIAGNOSIS — B370 Candidal stomatitis: Secondary | ICD-10-CM

## 2017-06-22 DIAGNOSIS — K1233 Oral mucositis (ulcerative) due to radiation: Secondary | ICD-10-CM | POA: Diagnosis not present

## 2017-06-22 DIAGNOSIS — R7989 Other specified abnormal findings of blood chemistry: Secondary | ICD-10-CM | POA: Diagnosis not present

## 2017-06-22 MED ORDER — FLUCONAZOLE 150 MG PO TABS: 150.0000 mg | ORAL_TABLET | Freq: Once | ORAL | 0 refills | Status: AC

## 2017-06-22 NOTE — Progress Notes (Signed)
Name: Troy [name]   MRN: [id number]    DOB: [birth year]   Date:06/22/2017       Progress Note  Subjective  Chief Complaint  Chief Complaint  Patient presents with  . Follow-up    needs dental medicine sent in    Hypertension  This is a chronic problem. The current episode started more than 1 year ago. The problem has been gradually improving since onset. The problem is controlled. Pertinent negatives include no anxiety, blurred vision, chest pain, headaches, malaise/fatigue, neck pain, orthopnea, palpitations, peripheral edema, PND, shortness of breath or sweats. Past treatments include ACE inhibitors. The current treatment provides moderate improvement. There are no compliance problems (self decrease lisinopril ).  Hypertensive end-organ damage includes kidney disease. There is no history of angina, CAD/MI, CVA, heart failure, left ventricular hypertrophy, PVD or retinopathy. prerenal vs ckd. There is no history of chronic renal disease, a hypertension causing med or renovascular disease.    No problem-specific Assessment & Plan notes found for this encounter.   Past Medical History:  Diagnosis Date  . Cancer ([place])   . GERD (gastroesophageal reflux disease)   . Hypertension   . Thyroid disease     Past Surgical History:  Procedure Laterality Date  . tumor removed      Family History  Problem Relation Age of Onset  . Cancer Mother   . Cancer Sister   . Cancer Maternal Aunt   . Cancer Paternal Uncle     Social History   Socioeconomic History  . Marital status: Unknown    Spouse name: Not on file  . Number of children: Not on file  . Years of education: Not on file  . Highest education level: Not on file  Occupational History  . Not on file  Social Needs  . Financial resource strain: Not on file  . Food insecurity:    Worry: Patient refused    Inability: Patient refused  . Transportation needs:    Medical: Patient refused    Non-medical: Patient refused   Tobacco Use  . Smoking status: Never Smoker  . Smokeless tobacco: Never Used  Substance and Sexual Activity  . Alcohol use: No    Frequency: Never  . Drug use: No  . Sexual activity: Not on file  Lifestyle  . Physical activity:    Days per week: Not on file    Minutes per session: Not on file  . Stress: Very much  Relationships  . Social connections:    Talks on phone: Patient refused    Gets together: Patient refused    Attends religious service: Patient refused    Active member of club or organization: Patient refused    Attends meetings of clubs or organizations: Patient refused    Relationship status: Patient refused  . Intimate partner violence:    Fear of current or ex partner: Not on file    Emotionally abused: Not on file    Physically abused: Not on file    Forced sexual activity: Not on file  Other Topics Concern  . Not on file  Social History Narrative  . Not on file    No Known Allergies  Outpatient Medications Prior to Visit  Medication Sig Dispense Refill  . acetaminophen (TYLENOL) 650 MG CR tablet Take 1,300 mg by mouth every 8 (eight) hours.    Marland Kitchen esomeprazole (NEXIUM) 40 MG capsule Take 1 capsule (40 mg total) by mouth daily. 30 capsule 2  . levothyroxine (SYNTHROID, LEVOTHROID)  125 MCG tablet Take 1 tablet (125 mcg total) by mouth daily. 90 tablet 3  . sildenafil (VIAGRA) 100 MG tablet Take 0.5-1 tablets (50-100 mg total) by mouth daily as needed for erectile dysfunction. 5 tablet 11  . SODIUM FLUORIDE, DENTAL RINSE, 0.05 % SOLN Use as directed in the mouth or throat.    Marland Kitchen lisinopril (PRINIVIL,ZESTRIL) 5 MG tablet Take 1 tablet (5 mg total) by mouth daily. 90 tablet 3  . doxycycline (VIBRA-TABS) 100 MG tablet Take 1 tablet (100 mg total) by mouth 2 (two) times daily. 10 tablet 0  . promethazine-codeine (PHENERGAN WITH CODEINE) 6.25-10 MG/5ML syrup Take 5 mLs by mouth every 6 (six) hours as needed for cough. 120 mL 0  . traMADol (ULTRAM) 50 MG tablet Take  1 tablet (50 mg total) by mouth every 8 (eight) hours as needed. 30 tablet 0   No facility-administered medications prior to visit.     Review of Systems  Constitutional: Negative for chills, fever, malaise/fatigue and weight loss.  HENT: Negative for ear discharge, ear pain and sore throat.   Eyes: Negative for blurred vision.  Respiratory: Negative for cough, sputum production, shortness of breath and wheezing.   Cardiovascular: Negative for chest pain, palpitations, orthopnea, leg swelling and PND.  Gastrointestinal: Negative for abdominal pain, blood in stool, constipation, diarrhea, heartburn, melena and nausea.  Genitourinary: Negative for dysuria, frequency, hematuria and urgency.  Musculoskeletal: Negative for back pain, joint pain, myalgias and neck pain.  Skin: Negative for rash.  Neurological: Negative for dizziness, tingling, sensory change, focal weakness and headaches.  Endo/Heme/Allergies: Negative for environmental allergies and polydipsia. Does not bruise/bleed easily.  Psychiatric/Behavioral: Negative for depression and suicidal ideas. The patient is not nervous/anxious and does not have insomnia.      Objective  Vitals:   06/22/17 1354  BP: 112/70  Pulse: 80  Weight: 168 lb (76.2 kg)  Height: 5\' 7"  (1.702 m)    Physical Exam  Constitutional: He is oriented to person, place, and time.  HENT:  Head: Normocephalic.  Right Ear: External ear normal.  Left Ear: External ear normal.  Nose: Nose normal.  Mouth/Throat: Uvula is midline. Mucous membranes are dry. No oropharyngeal exudate, posterior oropharyngeal edema or posterior oropharyngeal erythema.  Oral mucosa hyperremic  Eyes: Pupils are equal, round, and reactive to light. Conjunctivae and EOM are normal. Right eye exhibits no discharge. Left eye exhibits no discharge. No scleral icterus.  Neck: Normal range of motion. Neck supple. No JVD present. No tracheal deviation present. No thyromegaly present.   Cardiovascular: Normal rate, regular rhythm, normal heart sounds and intact distal pulses. Exam reveals no gallop and no friction rub.  No murmur heard. Pulmonary/Chest: Breath sounds normal. No respiratory distress. He has no wheezes. He has no rales.  Abdominal: Soft. Bowel sounds are normal. He exhibits no mass. There is no hepatosplenomegaly. There is no tenderness. There is no rebound, no guarding and no CVA tenderness.  Musculoskeletal: Normal range of motion. He exhibits no edema or tenderness.  Lymphadenopathy:    He has no cervical adenopathy.  Neurological: He is alert and oriented to person, place, and time. He has normal strength and normal reflexes. No cranial nerve deficit.  Skin: Skin is warm. No rash noted.  Nursing note and vitals reviewed.     Assessment & Plan  Problem List Items Addressed This Visit      Genitourinary   CKD (chronic kidney disease) stage 3, GFR 30-59 ml/min (HCC)  Other   Oral mucositis due to radiation    Other Visit Diagnoses    Prerenal azotemia    -  Primary   encourage fluid   Oral candidiasis       Relevant Medications   fluconazole (DIFLUCAN) 150 MG tablet      Meds ordered this encounter  Medications  . fluconazole (DIFLUCAN) 150 MG tablet    Sig: Take 1 tablet (150 mg total) by mouth once for 1 dose.    Dispense:  2 tablet    Refill:  0    One now and repeat in 1 week      Dr. Otilio Miu [facility]  06/22/17

## 2017-07-02 NOTE — Progress Notes (Signed)
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  INTERVAL HISTORY: Patient returns to clinic today for further evaluation and consideration of cycle 10 of Keytruda.  He continues to have mild difficulty swallowing which is chronic and unchanged.  He also complains of insomnia.  He otherwise feels well and is asymptomatic.  He has no neurologic complaints. He denies any recent fevers or illnesses.  He has a good appetite and denies weight loss. He denies any chest pain, cough, hemoptysis, or shortness of breath. He denies any nausea, vomiting, constipation, or diarrhea.  He has no urinary complaints.  Patient offers no further specific complaints today.  REVIEW OF SYSTEMS:   Review of Systems  Constitutional: Negative.  Negative for fever, malaise/fatigue and weight loss.  HENT: Negative.  Negative for sore throat.   Respiratory: Negative.  Negative for cough, shortness of breath and stridor.   Cardiovascular: Negative.  Negative for chest pain and leg swelling.  Gastrointestinal: Negative.  Negative for abdominal pain, blood in stool and melena.  Genitourinary: Negative.  Negative for dysuria.  Musculoskeletal: Negative.   Skin: Negative.  Negative for itching and rash.  Neurological: Negative.  Negative for sensory change, focal weakness and weakness.  Psychiatric/Behavioral: The patient has insomnia. The patient is not nervous/anxious.     As per HPI. Otherwise, a complete review of systems is negative.  PAST MEDICAL HISTORY: Past Medical History:  Diagnosis Date  . Cancer ([place])   . GERD (gastroesophageal reflux disease)   . Hypertension   . Thyroid disease     PAST SURGICAL HISTORY: Past Surgical History:  Procedure Laterality Date  .  tumor removed      FAMILY HISTORY: Family History  Problem Relation Age of Onset  . Cancer Mother   . Cancer Sister   . Cancer Maternal Aunt   . Cancer Paternal Uncle     ADVANCED DIRECTIVES (Y/N):  N  HEALTH MAINTENANCE: Social History   Tobacco Use  . Smoking status: Never Smoker  . Smokeless tobacco: Never Used  Substance Use Topics  . Alcohol use: No    Frequency: Never  . Drug use: No     Colonoscopy:  PAP:  Bone density:  Lipid panel:  No Known Allergies  Current Outpatient Medications  Medication Sig Dispense Refill  . acetaminophen (TYLENOL) 650 MG CR tablet Take 1,300 mg by mouth every 8 (eight) hours.    Marland Kitchen esomeprazole (NEXIUM) 40 MG capsule Take 1 capsule (40 mg total) by mouth daily. 30 capsule 2  . levothyroxine (SYNTHROID, LEVOTHROID) 125 MCG tablet Take 1 tablet (125 mcg total) by mouth daily. 90 tablet 3  . sildenafil (VIAGRA) 100 MG tablet Take 0.5-1 tablets (50-100 mg total) by mouth daily as needed for erectile dysfunction. 5 tablet 11  . SODIUM FLUORIDE, DENTAL RINSE, 0.05 % SOLN Use as directed in the mouth or throat.     No current facility-administered medications for this visit.     OBJECTIVE: Vitals:   07/07/17 0944  BP: 128/85  Pulse: 85  Temp: (!) 97.1 F (36.2 C)     Body mass index is 25.69 kg/m.    ECOG FS:0 - Asymptomatic  General: Well-developed, well-nourished, no acute distress. Eyes: Pink conjunctiva, anicteric sclera. HEENT: Normocephalic, moist mucous membranes, clear oropharnyx.  No palpable lymphadenopathy. Lungs: Clear to auscultation bilaterally. Heart: Regular rate and rhythm. No rubs, murmurs, or gallops. Abdomen: Soft, nontender, nondistended. No organomegaly noted, normoactive bowel sounds. Musculoskeletal: No edema, cyanosis, or clubbing. Neuro: Alert, answering all questions appropriately. Cranial nerves grossly intact. Skin: No rashes or petechiae noted. Psych: Normal affect.  LAB RESULTS:  Lab Results    Component Value Date   NA 139 07/07/2017   K 3.7 07/07/2017   CL 106 07/07/2017   CO2 23 07/07/2017   GLUCOSE 112 (H) 07/07/2017   BUN 16 07/07/2017   CREATININE 1.62 (H) 07/07/2017   CALCIUM 8.7 (L) 07/07/2017   PROT 7.1 07/07/2017   ALBUMIN 3.7 07/07/2017   AST 22 07/07/2017   ALT 14 (L) 07/07/2017   ALKPHOS 42 07/07/2017   BILITOT 0.9 07/07/2017   GFRNONAA 43 (L) 07/07/2017   GFRAA 49 (L) 07/07/2017    Lab Results  Component Value Date   WBC 4.9 07/07/2017   NEUTROABS 2.9 07/07/2017   HGB 15.9 07/07/2017   HCT 46.8 07/07/2017   MCV 94.8 07/07/2017   PLT 244 07/07/2017     STUDIES: No results found.  Oncology history: Patient was originally diagnosed with squamous cell carcinoma of the base of tongue (stage T3 N3 M0, p16+) in October 2016.  He underwent concurrent chemotherapy and radiation with cisplatin starting in February 2017 and completing on May 21, 2015.  Follow-up PET scan on September 02, 2015 was reported as equivocal.  Repeat PET scan on December 07, 2015 reported abnormal FDG uptake, but this was thought to be reactive from prior radiation therapy.  PET scan on Jul 05, 2016 revealed a level 2 lymph node increasing in size and an SUV.  Biopsy in June 2019 confirmed recurrence.  Patient had several delays in treatment, but was finally scheduled for surgery on October 21, 2016.  Unfortunately, he was found that the recurrence was inoperable.  Patient initiated palliative Beryle Flock every 3 weeks on October 27, 2016.    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  PLAN:    1. 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  PET scan results from April 12, 2017 reviewed independently with no evidence of recurrent or progressive disease.  Patient was previously given a referral to ENT to establish care.  Proceed with cycle 10 of Keytruda today.  Return to clinic in 3 weeks for cycle 11 and then in 6 weeks for  further evaluation and consideration of cycle 12.  Plan to reimage in approximately August 2019.  Will consider one year of Keytruda and then discontinue if there is no evidence of disease.  2.  Rash: Resolved with Medrol Dosepak.  Possibly a contact dermatitis, monitor. 3.  Renal insufficiency: Chronic.  Patient's creatinine appears to be at his baseline.  Approximately 30 minutes spent in discussion of which greater than 50% was consultation.  Patient expressed understanding and was in agreement with this plan. He also understands that He can call clinic at any time with any questions, concerns, or complaints.   Cancer Staging No matching staging information was found for the patient.  Lloyd Huger, MD   07/07/2017 1:24 PM

## 2017-07-07 ENCOUNTER — Inpatient Hospital Stay: Payer: Medicaid Other

## 2017-07-07 ENCOUNTER — Encounter: Payer: Self-pay | Admitting: Oncology

## 2017-07-07 ENCOUNTER — Other Ambulatory Visit: Payer: Self-pay

## 2017-07-07 ENCOUNTER — Inpatient Hospital Stay (HOSPITAL_BASED_OUTPATIENT_CLINIC_OR_DEPARTMENT_OTHER): Payer: Medicaid Other | Admitting: Oncology

## 2017-07-07 VITALS — BP 128/85 | HR 85 | Temp 97.1°F | Wt 164.0 lb

## 2017-07-07 DIAGNOSIS — I10 Essential (primary) hypertension: Secondary | ICD-10-CM | POA: Diagnosis not present

## 2017-07-07 DIAGNOSIS — E079 Disorder of thyroid, unspecified: Secondary | ICD-10-CM

## 2017-07-07 DIAGNOSIS — K219 Gastro-esophageal reflux disease without esophagitis: Secondary | ICD-10-CM

## 2017-07-07 DIAGNOSIS — G4700 Insomnia, unspecified: Secondary | ICD-10-CM | POA: Diagnosis not present

## 2017-07-07 DIAGNOSIS — C01 Malignant neoplasm of base of tongue: Secondary | ICD-10-CM

## 2017-07-07 DIAGNOSIS — Z79899 Other long term (current) drug therapy: Secondary | ICD-10-CM | POA: Diagnosis not present

## 2017-07-07 DIAGNOSIS — Z5112 Encounter for antineoplastic immunotherapy: Secondary | ICD-10-CM | POA: Diagnosis not present

## 2017-07-07 DIAGNOSIS — Z809 Family history of malignant neoplasm, unspecified: Secondary | ICD-10-CM | POA: Diagnosis not present

## 2017-07-07 DIAGNOSIS — N289 Disorder of kidney and ureter, unspecified: Secondary | ICD-10-CM | POA: Diagnosis not present

## 2017-07-07 LAB — CBC WITH DIFFERENTIAL/PLATELET
Basophils Absolute: 0 10*3/uL (ref 0–0.1)
Basophils Relative: 1 %
Eosinophils Absolute: 0.1 10*3/uL (ref 0–0.7)
Eosinophils Relative: 3 %
HEMATOCRIT: 46.8 % (ref 40.0–52.0)
Hemoglobin: 15.9 g/dL (ref 13.0–18.0)
LYMPHS ABS: 1.3 10*3/uL (ref 1.0–3.6)
LYMPHS PCT: 26 %
MCH: 32.1 pg (ref 26.0–34.0)
MCHC: 33.9 g/dL (ref 32.0–36.0)
MCV: 94.8 fL (ref 80.0–100.0)
MONOS PCT: 12 %
Monocytes Absolute: 0.6 10*3/uL (ref 0.2–1.0)
NEUTROS ABS: 2.9 10*3/uL (ref 1.4–6.5)
NEUTROS PCT: 58 %
Platelets: 244 10*3/uL (ref 150–440)
RBC: 4.93 MIL/uL (ref 4.40–5.90)
RDW: 13.7 % (ref 11.5–14.5)
WBC: 4.9 10*3/uL (ref 3.8–10.6)

## 2017-07-07 LAB — COMPREHENSIVE METABOLIC PANEL
ALT: 14 U/L — ABNORMAL LOW (ref 17–63)
AST: 22 U/L (ref 15–41)
Albumin: 3.7 g/dL (ref 3.5–5.0)
Alkaline Phosphatase: 42 U/L (ref 38–126)
Anion gap: 10 (ref 5–15)
BUN: 16 mg/dL (ref 6–20)
CHLORIDE: 106 mmol/L (ref 101–111)
CO2: 23 mmol/L (ref 22–32)
Calcium: 8.7 mg/dL — ABNORMAL LOW (ref 8.9–10.3)
Creatinine, Ser: 1.62 mg/dL — ABNORMAL HIGH (ref 0.61–1.24)
GFR, EST AFRICAN AMERICAN: 49 mL/min — AB (ref >60)
GFR, EST NON AFRICAN AMERICAN: 43 mL/min — AB (ref >60)
Glucose, Bld: 112 mg/dL — ABNORMAL HIGH (ref 65–99)
POTASSIUM: 3.7 mmol/L (ref 3.5–5.1)
Sodium: 139 mmol/L (ref 135–145)
Total Bilirubin: 0.9 mg/dL (ref 0.3–1.2)
Total Protein: 7.1 g/dL (ref 6.5–8.1)

## 2017-07-07 MED ORDER — SODIUM CHLORIDE 0.9 % IV SOLN
Freq: Once | INTRAVENOUS | Status: AC
  Administered 2017-07-07: 11:00:00 via INTRAVENOUS
  Filled 2017-07-07: qty 1000

## 2017-07-07 MED ORDER — PEMBROLIZUMAB CHEMO INJECTION 100 MG/4ML
200.0000 mg | Freq: Once | INTRAVENOUS | Status: AC
  Administered 2017-07-07: 200 mg via INTRAVENOUS
  Filled 2017-07-07: qty 8

## 2017-07-07 NOTE — Progress Notes (Signed)
Creatinine outside of parameters, ok to proceed with treatment per Dr Grayland Ormond.

## 2017-07-07 NOTE — Progress Notes (Signed)
Patient here today for follow up.   

## 2017-07-08 LAB — THYROID PANEL WITH TSH
FREE THYROXINE INDEX: 2.6 (ref 1.2–4.9)
T3 Uptake Ratio: 33 % (ref 24–39)
T4 TOTAL: 7.9 ug/dL (ref 4.5–12.0)
TSH: 0.429 u[IU]/mL — ABNORMAL LOW (ref 0.450–4.500)

## 2017-07-28 ENCOUNTER — Inpatient Hospital Stay: Payer: Medicaid Other | Attending: Oncology

## 2017-07-28 ENCOUNTER — Ambulatory Visit: Payer: Medicaid Other

## 2017-07-28 VITALS — BP 132/83 | HR 77 | Temp 96.7°F | Resp 20

## 2017-07-28 DIAGNOSIS — C01 Malignant neoplasm of base of tongue: Secondary | ICD-10-CM

## 2017-07-28 DIAGNOSIS — Z5112 Encounter for antineoplastic immunotherapy: Secondary | ICD-10-CM | POA: Insufficient documentation

## 2017-07-28 LAB — COMPREHENSIVE METABOLIC PANEL
ALBUMIN: 3.8 g/dL (ref 3.5–5.0)
ALT: 12 U/L — ABNORMAL LOW (ref 17–63)
ANION GAP: 10 (ref 5–15)
AST: 20 U/L (ref 15–41)
Alkaline Phosphatase: 43 U/L (ref 38–126)
BUN: 17 mg/dL (ref 6–20)
CHLORIDE: 102 mmol/L (ref 101–111)
CO2: 26 mmol/L (ref 22–32)
Calcium: 8.6 mg/dL — ABNORMAL LOW (ref 8.9–10.3)
Creatinine, Ser: 1.89 mg/dL — ABNORMAL HIGH (ref 0.61–1.24)
GFR calc Af Amer: 41 mL/min — ABNORMAL LOW (ref >60)
GFR calc non Af Amer: 35 mL/min — ABNORMAL LOW (ref >60)
Glucose, Bld: 102 mg/dL — ABNORMAL HIGH (ref 65–99)
POTASSIUM: 3.8 mmol/L (ref 3.5–5.1)
Sodium: 138 mmol/L (ref 135–145)
Total Bilirubin: 0.8 mg/dL (ref 0.3–1.2)
Total Protein: 7.3 g/dL (ref 6.5–8.1)

## 2017-07-28 LAB — CBC WITH DIFFERENTIAL/PLATELET
BASOS ABS: 0.1 10*3/uL (ref 0–0.1)
BASOS PCT: 1 %
Eosinophils Absolute: 0.1 10*3/uL (ref 0–0.7)
Eosinophils Relative: 2 %
HEMATOCRIT: 46.6 % (ref 40.0–52.0)
Hemoglobin: 15.7 g/dL (ref 13.0–18.0)
LYMPHS PCT: 18 %
Lymphs Abs: 1.2 10*3/uL (ref 1.0–3.6)
MCH: 31.9 pg (ref 26.0–34.0)
MCHC: 33.6 g/dL (ref 32.0–36.0)
MCV: 94.9 fL (ref 80.0–100.0)
MONO ABS: 0.9 10*3/uL (ref 0.2–1.0)
MONOS PCT: 13 %
NEUTROS ABS: 4.5 10*3/uL (ref 1.4–6.5)
NEUTROS PCT: 66 %
Platelets: 248 10*3/uL (ref 150–440)
RBC: 4.91 MIL/uL (ref 4.40–5.90)
RDW: 13.2 % (ref 11.5–14.5)
WBC: 6.8 10*3/uL (ref 3.8–10.6)

## 2017-07-28 MED ORDER — SODIUM CHLORIDE 0.9 % IV SOLN
Freq: Once | INTRAVENOUS | Status: AC
  Administered 2017-07-28: 14:00:00 via INTRAVENOUS
  Filled 2017-07-28: qty 1000

## 2017-07-28 MED ORDER — SODIUM CHLORIDE 0.9 % IV SOLN
200.0000 mg | Freq: Once | INTRAVENOUS | Status: AC
  Administered 2017-07-28: 200 mg via INTRAVENOUS
  Filled 2017-07-28: qty 8

## 2017-07-28 NOTE — Patient Instructions (Signed)
Pembrolizumab injection  What is this medicine?  PEMBROLIZUMAB (pem broe liz ue mab) is a monoclonal antibody. It is used to treat melanoma, head and neck cancer, Hodgkin lymphoma, non-small cell lung cancer, urothelial cancer, stomach cancer, and cancers that have a certain genetic condition.  This medicine may be used for other purposes; ask your health care provider or pharmacist if you have questions.  COMMON BRAND NAME(S): Keytruda  What should I tell my health care provider before I take this medicine?  They need to know if you have any of these conditions:  -diabetes  -immune system problems  -inflammatory bowel disease  -liver disease  -lung or breathing disease  -lupus  -organ transplant  -an unusual or allergic reaction to pembrolizumab, other medicines, foods, dyes, or preservatives  -pregnant or trying to get pregnant  -breast-feeding  How should I use this medicine?  This medicine is for infusion into a vein. It is given by a health care professional in a hospital or clinic setting.  A special MedGuide will be given to you before each treatment. Be sure to read this information carefully each time.  Talk to your pediatrician regarding the use of this medicine in children. While this drug may be prescribed for selected conditions, precautions do apply.  Overdosage: If you think you have taken too much of this medicine contact a poison control center or emergency room at once.  NOTE: This medicine is only for you. Do not share this medicine with others.  What if I miss a dose?  It is important not to miss your dose. Call your doctor or health care professional if you are unable to keep an appointment.  What may interact with this medicine?  Interactions have not been studied.  Give your health care provider a list of all the medicines, herbs, non-prescription drugs, or dietary supplements you use. Also tell them if you smoke, drink alcohol, or use illegal drugs. Some items may interact with your  medicine.  This list may not describe all possible interactions. Give your health care provider a list of all the medicines, herbs, non-prescription drugs, or dietary supplements you use. Also tell them if you smoke, drink alcohol, or use illegal drugs. Some items may interact with your medicine.  What should I watch for while using this medicine?  Your condition will be monitored carefully while you are receiving this medicine.  You may need blood work done while you are taking this medicine.  Do not become pregnant while taking this medicine or for 4 months after stopping it. Women should inform their doctor if they wish to become pregnant or think they might be pregnant. There is a potential for serious side effects to an unborn child. Talk to your health care professional or pharmacist for more information. Do not breast-feed an infant while taking this medicine or for 4 months after the last dose.  What side effects may I notice from receiving this medicine?  Side effects that you should report to your doctor or health care professional as soon as possible:  -allergic reactions like skin rash, itching or hives, swelling of the face, lips, or tongue  -bloody or black, tarry  -breathing problems  -changes in vision  -chest pain  -chills  -constipation  -cough  -dizziness or feeling faint or lightheaded  -fast or irregular heartbeat  -fever  -flushing  -hair loss  -low blood counts - this medicine may decrease the number of white blood cells, red blood cells   and platelets. You may be at increased risk for infections and bleeding.  -muscle pain  -muscle weakness  -persistent headache  -signs and symptoms of high blood sugar such as dizziness; dry mouth; dry skin; fruity breath; nausea; stomach pain; increased hunger or thirst; increased urination  -signs and symptoms of kidney injury like trouble passing urine or change in the amount of urine  -signs and symptoms of liver injury like dark urine, light-colored  stools, loss of appetite, nausea, right upper belly pain, yellowing of the eyes or skin  -stomach pain  -sweating  -weight loss  Side effects that usually do not require medical attention (report to your doctor or health care professional if they continue or are bothersome):  -decreased appetite  -diarrhea  -tiredness  This list may not describe all possible side effects. Call your doctor for medical advice about side effects. You may report side effects to FDA at 1-800-FDA-1088.  Where should I keep my medicine?  This drug is given in a hospital or clinic and will not be stored at home.  NOTE: This sheet is a summary. It may not cover all possible information. If you have questions about this medicine, talk to your doctor, pharmacist, or health care provider.   2018 Elsevier/Gold Standard (2015-11-10 12:29:36)

## 2017-07-28 NOTE — Progress Notes (Signed)
Cr =1.89.  Md ok to treat today.

## 2017-08-01 LAB — THYROID PANEL WITH TSH
FREE THYROXINE INDEX: 1.8 (ref 1.2–4.9)
T3 Uptake Ratio: 29 %
T4, Total: 6.1 ug/dL (ref 4.5–12.0)
TSH: 4.68 u[IU]/mL — AB (ref 0.450–4.500)

## 2017-08-15 ENCOUNTER — Other Ambulatory Visit: Payer: Self-pay | Admitting: Family Medicine

## 2017-08-20 NOTE — Progress Notes (Signed)
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  INTERVAL HISTORY: Patient returns to clinic today for further evaluation and consideration of cycle 12 of maintenance Keytruda.  He is tolerating his treatments well without significant side effects.  He has occasional dysphasia and difficulty swallowing, but this is chronic and unchanged.  He has no neurologic complaints. He denies any recent fevers or illnesses.  He has a good appetite and denies weight loss. He denies any chest pain, cough, hemoptysis, or shortness of breath. He denies any nausea, vomiting, constipation, or diarrhea.  He has no urinary complaints.  Patient offers no further specific complaints today.  REVIEW OF SYSTEMS:   Review of Systems  Constitutional: Negative.  Negative for fever, malaise/fatigue and weight loss.  HENT: Negative.  Negative for sore throat.   Respiratory: Negative.  Negative for cough, shortness of breath and stridor.   Cardiovascular: Negative.  Negative for chest pain and leg swelling.  Gastrointestinal: Negative.  Negative for abdominal pain, blood in stool and melena.  Genitourinary: Negative.  Negative for dysuria.  Musculoskeletal: Negative.   Skin: Negative.  Negative for itching and rash.  Neurological: Negative.  Negative for sensory change, focal weakness, weakness and headaches.  Psychiatric/Behavioral: The patient has insomnia. The patient is not nervous/anxious.     As per HPI. Otherwise, a complete review of systems is negative.  PAST MEDICAL HISTORY: Past Medical History:  Diagnosis Date  . Cancer ([place])   . GERD (gastroesophageal reflux disease)   . Hypertension   . Thyroid disease     PAST SURGICAL HISTORY: Past Surgical History:    Procedure Laterality Date  . tumor removed      FAMILY HISTORY: Family History  Problem Relation Age of Onset  . Cancer Mother   . Cancer Sister   . Cancer Maternal Aunt   . Cancer Paternal Uncle     ADVANCED DIRECTIVES (Y/N):  N  HEALTH MAINTENANCE: Social History   Tobacco Use  . Smoking status: Never Smoker  . Smokeless tobacco: Never Used  Substance Use Topics  . Alcohol use: No    Frequency: Never  . Drug use: No     Colonoscopy:  PAP:  Bone density:  Lipid panel:  No Known Allergies  Current Outpatient Medications  Medication Sig Dispense Refill  . SODIUM FLUORIDE, DENTAL RINSE, 0.05 % SOLN Use as directed in the mouth or throat.    Marland Kitchen acetaminophen (TYLENOL) 650 MG CR tablet Take 1,300 mg by mouth every 8 (eight) hours.    Marland Kitchen esomeprazole (NEXIUM) 40 MG capsule Take 1 capsule (40 mg total) by mouth daily. 30 capsule 5  . levothyroxine (SYNTHROID, LEVOTHROID) 125 MCG tablet Take 1 tablet (125 mcg total) by mouth daily. 30 tablet 5  . sildenafil (VIAGRA) 100 MG tablet Take 0.5-1 tablets (50-100 mg total) by mouth daily as needed for erectile dysfunction. 5 tablet 11   No current facility-administered medications for this visit.     OBJECTIVE: Vitals:   08/25/17 0908  BP: (!) 163/92  Pulse: 78  Resp: 18  Temp: (!) 96.3 F (35.7 C)     Body mass index is 26.28 kg/m.    ECOG FS:0 - Asymptomatic  General: Well-developed, well-nourished, no acute distress. Eyes: Pink conjunctiva, anicteric sclera. HEENT: Normocephalic,  moist mucous membranes, clear oropharnyx.  No palpable lymphadenopathy. Lungs: Clear to auscultation bilaterally. Heart: Regular rate and rhythm. No rubs, murmurs, or gallops. Abdomen: Soft, nontender, nondistended. No organomegaly noted, normoactive bowel sounds. Musculoskeletal: No edema, cyanosis, or clubbing. Neuro: Alert, answering all questions appropriately. Cranial nerves grossly intact. Skin: No rashes or petechiae noted. Psych:  Normal affect.  LAB RESULTS:  Lab Results  Component Value Date   NA 140 08/25/2017   K 3.5 08/25/2017   CL 103 08/25/2017   CO2 27 08/25/2017   GLUCOSE 110 (H) 08/25/2017   BUN 14 08/25/2017   CREATININE 1.79 (H) 08/25/2017   CALCIUM 8.2 (L) 08/25/2017   PROT 6.6 08/25/2017   ALBUMIN 3.4 (L) 08/25/2017   AST 22 08/25/2017   ALT 13 08/25/2017   ALKPHOS 47 08/25/2017   BILITOT 0.7 08/25/2017   GFRNONAA 38 (L) 08/25/2017   GFRAA 44 (L) 08/25/2017    Lab Results  Component Value Date   WBC 4.3 08/25/2017   NEUTROABS 2.4 08/25/2017   HGB 15.6 08/25/2017   HCT 47.2 08/25/2017   MCV 94.0 08/25/2017   PLT 213 08/25/2017     STUDIES: No results found.  Oncology history: Patient was originally diagnosed with squamous cell carcinoma of the base of tongue (stage T3 N3 M0, p16+) in October 2016.  He underwent concurrent chemotherapy and radiation with cisplatin starting in February 2017 and completing on May 21, 2015.  Follow-up PET scan on September 02, 2015 was reported as equivocal.  Repeat PET scan on December 07, 2015 reported abnormal FDG uptake, but this was thought to be reactive from prior radiation therapy.  PET scan on Jul 05, 2016 revealed a level 2 lymph node increasing in size and an SUV.  Biopsy in June 2019 confirmed recurrence.  Patient had several delays in treatment, but was finally scheduled for surgery on October 21, 2016.  Unfortunately, he was found that the recurrence was inoperable.  Patient initiated palliative Beryle Flock every 3 weeks on October 27, 2016.    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  PLAN:    1. 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  PET scan results from April 12, 2017 reviewed independently with no evidence of recurrent or progressive disease.  Patient was previously given a referral to ENT to establish care.  Proceed with cycle 12 Keytruda today.  Return to clinic in 3  weeks for cycle 13 and then in 6 weeks for further evaluation and consideration of cycle 14.  Will reimage with CT scan 1 to 2 days prior to cycle 14.  If patient continues to have no evidence of disease, will consider one year of Keytruda and then discontinue. 2.  Rash: This has not recurred.  Unrelated to [facility].  Resolved with Medrol Dosepak.  Possibly a contact dermatitis, monitor. 3.  Renal insufficiency: Chronic.  Patient's creatinine is 1.97 which is approximately his baseline. 4.  Suppressed TSH: Monitor closely while on Keytruda.   Patient expressed understanding and was in agreement with this plan. He also understands that He can call clinic at any time with any questions, concerns, or complaints.   Cancer Staging No matching staging information was found for the patient.  Lloyd Huger, MD   08/31/2017 1:10 PM

## 2017-08-25 ENCOUNTER — Inpatient Hospital Stay: Payer: Medicare Other | Attending: Oncology | Admitting: Oncology

## 2017-08-25 ENCOUNTER — Other Ambulatory Visit: Payer: Self-pay

## 2017-08-25 ENCOUNTER — Inpatient Hospital Stay: Payer: Medicare Other

## 2017-08-25 VITALS — BP 129/77 | HR 63 | Temp 96.0°F | Resp 18

## 2017-08-25 VITALS — BP 163/92 | HR 78 | Temp 96.3°F | Resp 18 | Wt 167.8 lb

## 2017-08-25 DIAGNOSIS — Z5112 Encounter for antineoplastic immunotherapy: Secondary | ICD-10-CM | POA: Diagnosis not present

## 2017-08-25 DIAGNOSIS — C01 Malignant neoplasm of base of tongue: Secondary | ICD-10-CM | POA: Insufficient documentation

## 2017-08-25 DIAGNOSIS — I10 Essential (primary) hypertension: Secondary | ICD-10-CM | POA: Diagnosis not present

## 2017-08-25 DIAGNOSIS — Z79899 Other long term (current) drug therapy: Secondary | ICD-10-CM | POA: Insufficient documentation

## 2017-08-25 DIAGNOSIS — N289 Disorder of kidney and ureter, unspecified: Secondary | ICD-10-CM | POA: Diagnosis not present

## 2017-08-25 DIAGNOSIS — E079 Disorder of thyroid, unspecified: Secondary | ICD-10-CM | POA: Diagnosis not present

## 2017-08-25 DIAGNOSIS — K219 Gastro-esophageal reflux disease without esophagitis: Secondary | ICD-10-CM | POA: Insufficient documentation

## 2017-08-25 DIAGNOSIS — Z809 Family history of malignant neoplasm, unspecified: Secondary | ICD-10-CM | POA: Diagnosis not present

## 2017-08-25 LAB — CBC WITH DIFFERENTIAL/PLATELET
BASOS ABS: 0 10*3/uL (ref 0–0.1)
Basophils Relative: 1 %
Eosinophils Absolute: 0.3 10*3/uL (ref 0–0.7)
Eosinophils Relative: 6 %
HCT: 47.2 % (ref 40.0–52.0)
Hemoglobin: 15.6 g/dL (ref 13.0–18.0)
Lymphocytes Relative: 25 %
Lymphs Abs: 1.1 10*3/uL (ref 1.0–3.6)
MCH: 31 pg (ref 26.0–34.0)
MCHC: 33 g/dL (ref 32.0–36.0)
MCV: 94 fL (ref 80.0–100.0)
MONOS PCT: 13 %
Monocytes Absolute: 0.6 10*3/uL (ref 0.2–1.0)
Neutro Abs: 2.4 10*3/uL (ref 1.4–6.5)
Neutrophils Relative %: 55 %
PLATELETS: 213 10*3/uL (ref 150–440)
RBC: 5.02 MIL/uL (ref 4.40–5.90)
RDW: 13.6 % (ref 11.5–14.5)
WBC: 4.3 10*3/uL (ref 3.8–10.6)

## 2017-08-25 LAB — COMPREHENSIVE METABOLIC PANEL
ALT: 13 U/L (ref 0–44)
AST: 22 U/L (ref 15–41)
Albumin: 3.4 g/dL — ABNORMAL LOW (ref 3.5–5.0)
Alkaline Phosphatase: 47 U/L (ref 38–126)
Anion gap: 10 (ref 5–15)
BILIRUBIN TOTAL: 0.7 mg/dL (ref 0.3–1.2)
BUN: 14 mg/dL (ref 8–23)
CALCIUM: 8.2 mg/dL — AB (ref 8.9–10.3)
CHLORIDE: 103 mmol/L (ref 98–111)
CO2: 27 mmol/L (ref 22–32)
CREATININE: 1.79 mg/dL — AB (ref 0.61–1.24)
GFR calc Af Amer: 44 mL/min — ABNORMAL LOW (ref >60)
GFR, EST NON AFRICAN AMERICAN: 38 mL/min — AB (ref >60)
Glucose, Bld: 110 mg/dL — ABNORMAL HIGH (ref 70–99)
Potassium: 3.5 mmol/L (ref 3.5–5.1)
Sodium: 140 mmol/L (ref 135–145)
Total Protein: 6.6 g/dL (ref 6.5–8.1)

## 2017-08-25 MED ORDER — SODIUM CHLORIDE 0.9 % IV SOLN
200.0000 mg | Freq: Once | INTRAVENOUS | Status: AC
  Administered 2017-08-25: 200 mg via INTRAVENOUS
  Filled 2017-08-25: qty 8

## 2017-08-25 MED ORDER — SODIUM CHLORIDE 0.9 % IV SOLN
Freq: Once | INTRAVENOUS | Status: AC
  Administered 2017-08-25: 10:00:00 via INTRAVENOUS
  Filled 2017-08-25: qty 1000

## 2017-08-25 NOTE — Progress Notes (Signed)
Here for follow up. " I feel great" per pt

## 2017-08-26 LAB — THYROID PANEL WITH TSH
Free Thyroxine Index: 2.4 (ref 1.2–4.9)
T3 Uptake Ratio: 33 % (ref 24–39)
T4, Total: 7.2 ug/dL (ref 4.5–12.0)
TSH: 0.444 u[IU]/mL — ABNORMAL LOW (ref 0.450–4.500)

## 2017-08-29 ENCOUNTER — Ambulatory Visit (INDEPENDENT_AMBULATORY_CARE_PROVIDER_SITE_OTHER): Payer: Medicare Other | Admitting: Family Medicine

## 2017-08-29 ENCOUNTER — Encounter: Payer: Self-pay | Admitting: Family Medicine

## 2017-08-29 VITALS — BP 120/80 | HR 64 | Ht 67.0 in | Wt 167.0 lb

## 2017-08-29 DIAGNOSIS — I10 Essential (primary) hypertension: Secondary | ICD-10-CM

## 2017-08-29 DIAGNOSIS — E890 Postprocedural hypothyroidism: Secondary | ICD-10-CM

## 2017-08-29 DIAGNOSIS — K219 Gastro-esophageal reflux disease without esophagitis: Secondary | ICD-10-CM | POA: Diagnosis not present

## 2017-08-29 MED ORDER — ESOMEPRAZOLE MAGNESIUM 40 MG PO CPDR: 40.0000 mg | DELAYED_RELEASE_CAPSULE | Freq: Every day | ORAL | 5 refills | Status: DC

## 2017-08-29 MED ORDER — LEVOTHYROXINE SODIUM 125 MCG PO TABS: 125.0000 ug | ORAL_TABLET | Freq: Every day | ORAL | 5 refills | Status: DC

## 2017-08-29 NOTE — Assessment & Plan Note (Signed)
Chronic . Stable. Continue Nexium 40 mg daily

## 2017-08-29 NOTE — Assessment & Plan Note (Addendum)
Most recent TSH 444 /lower limit normal / cont levothyroxin 125 mcgm daily/recheck 1 year

## 2017-08-29 NOTE — Assessment & Plan Note (Signed)
Controlled with diet/ limiting sodium.

## 2017-08-29 NOTE — Progress Notes (Signed)
Name: Troy [name]   MRN: [id number]    DOB: [birth year]   Date:08/29/2017       Progress Note  Subjective  Chief Complaint  Chief Complaint  Patient presents with  . Hypothyroidism  . Gastroesophageal Reflux    Gastroesophageal Reflux  He reports no abdominal pain, no belching, no chest pain, no choking, no coughing, no dysphagia, no early satiety, no globus sensation, no heartburn, no hoarse voice, no nausea, no sore throat, no stridor, no tooth decay, no water brash or no wheezing. This is a chronic problem. The current episode started more than 1 year ago. The problem has been gradually improving. The symptoms are aggravated by certain foods (steak/hamburger). Pertinent negatives include no anemia, fatigue, melena, muscle weakness, orthopnea or weight loss. There are no known risk factors. He has tried a PPI for the symptoms. The treatment provided moderate relief. Past procedures include an abdominal ultrasound, an EGD and a UGI. Past procedures do not include esophageal manometry, esophageal pH monitoring or H. pylori antibody titer.  Thyroid Problem  Presents for follow-up visit. Patient reports no anxiety, cold intolerance, constipation, depressed mood, diaphoresis, diarrhea, dry skin, fatigue, hair loss, heat intolerance, hoarse voice, leg swelling, nail problem, palpitations, tremors, visual change, weight gain or weight loss. The symptoms have been stable.    GERD (gastroesophageal reflux disease) Chronic . Stable. Continue Nexium 40 mg daily   Hypertension Controlled with diet/ limiting sodium.  Hypothyroidism Most recent TSH 444 /lower limit normal / cont levothyroxin 125 mcgm daily/recheck 1 year   Past Medical History:  Diagnosis Date  . Cancer ([place])   . GERD (gastroesophageal reflux disease)   . Hypertension   . Thyroid disease     Past Surgical History:  Procedure Laterality Date  . tumor removed      Family History  Problem Relation Age of Onset  . Cancer  Mother   . Cancer Sister   . Cancer Maternal Aunt   . Cancer Paternal Uncle     Social History   Socioeconomic History  . Marital status: Unknown    Spouse name: Not on file  . Number of children: Not on file  . Years of education: Not on file  . Highest education level: Not on file  Occupational History  . Not on file  Social Needs  . Financial resource strain: Not on file  . Food insecurity:    Worry: Patient refused    Inability: Patient refused  . Transportation needs:    Medical: Patient refused    Non-medical: Patient refused  Tobacco Use  . Smoking status: Never Smoker  . Smokeless tobacco: Never Used  Substance and Sexual Activity  . Alcohol use: No    Frequency: Never  . Drug use: No  . Sexual activity: Not on file  Lifestyle  . Physical activity:    Days per week: Not on file    Minutes per session: Not on file  . Stress: Very much  Relationships  . Social connections:    Talks on phone: Patient refused    Gets together: Patient refused    Attends religious service: Patient refused    Active member of club or organization: Patient refused    Attends meetings of clubs or organizations: Patient refused    Relationship status: Patient refused  . Intimate partner violence:    Fear of current or ex partner: Not on file    Emotionally abused: Not on file    Physically abused: Not  on file    Forced sexual activity: Not on file  Other Topics Concern  . Not on file  Social History Narrative  . Not on file    No Known Allergies  Outpatient Medications Prior to Visit  Medication Sig Dispense Refill  . acetaminophen (TYLENOL) 650 MG CR tablet Take 1,300 mg by mouth every 8 (eight) hours.    . sildenafil (VIAGRA) 100 MG tablet Take 0.5-1 tablets (50-100 mg total) by mouth daily as needed for erectile dysfunction. 5 tablet 11  . SODIUM FLUORIDE, DENTAL RINSE, 0.05 % SOLN Use as directed in the mouth or throat.    . esomeprazole (NEXIUM) 40 MG capsule Take 1  capsule (40 mg total) by mouth daily. 30 capsule 4  . levothyroxine (SYNTHROID, LEVOTHROID) 125 MCG tablet Take 1 tablet (125 mcg total) by mouth daily. 90 tablet 3   No facility-administered medications prior to visit.     Review of Systems  Constitutional: Negative for chills, diaphoresis, fatigue, fever, malaise/fatigue, weight gain and weight loss.  HENT: Positive for ear pain. Negative for ear discharge, hoarse voice and sore throat.   Eyes: Negative for blurred vision, double vision, photophobia and pain.  Respiratory: Negative for cough, hemoptysis, sputum production, choking, shortness of breath and wheezing.   Cardiovascular: Negative for chest pain, palpitations, orthopnea, claudication, leg swelling and PND.  Gastrointestinal: Negative for abdominal pain, blood in stool, constipation, diarrhea, dysphagia, heartburn, melena and nausea.  Genitourinary: Negative for dysuria, frequency, hematuria and urgency.  Musculoskeletal: Negative for back pain, joint pain, myalgias, muscle weakness and neck pain.  Skin: Negative for rash.  Neurological: Negative for dizziness, tingling, tremors, sensory change, focal weakness and headaches.  Endo/Heme/Allergies: Negative for environmental allergies, cold intolerance, heat intolerance and polydipsia. Does not bruise/bleed easily.  Psychiatric/Behavioral: Negative for depression and suicidal ideas. The patient is not nervous/anxious and does not have insomnia.      Objective  Vitals:   08/29/17 1435  BP: 120/80  Pulse: 64  Weight: 167 lb (75.8 kg)  Height: 5\' 7"  (1.702 m)    Physical Exam  Constitutional: He is oriented to person, place, and time.  HENT:  Head: Normocephalic.  Right Ear: External ear normal.  Left Ear: External ear normal.  Nose: Nose normal.  Mouth/Throat: Oropharynx is clear and moist.  Eyes: Pupils are equal, round, and reactive to light. Conjunctivae and EOM are normal. Right eye exhibits no discharge. Left eye  exhibits no discharge. No scleral icterus.  Neck: Normal range of motion. Neck supple. No JVD present. No tracheal deviation present. No thyromegaly present.  Cardiovascular: Normal rate, regular rhythm, normal heart sounds and intact distal pulses. Exam reveals no gallop and no friction rub.  No murmur heard. Pulmonary/Chest: Breath sounds normal. No respiratory distress. He has no wheezes. He has no rales.  Abdominal: Soft. Bowel sounds are normal. He exhibits no mass. There is no hepatosplenomegaly. There is no tenderness. There is no rebound, no guarding and no CVA tenderness.  Musculoskeletal: Normal range of motion. He exhibits no edema or tenderness.  Lymphadenopathy:    He has no cervical adenopathy.  Neurological: He is alert and oriented to person, place, and time. He has normal strength and normal reflexes. No cranial nerve deficit.  Skin: Skin is warm. No rash noted.  Nursing note and vitals reviewed.     Assessment & Plan  Problem List Items Addressed This Visit      Cardiovascular and Mediastinum   Hypertension - Primary  Controlled with diet/ limiting sodium.        Digestive   GERD (gastroesophageal reflux disease)    Chronic . Stable. Continue Nexium 40 mg daily       Relevant Medications   esomeprazole (NEXIUM) 40 MG capsule     Endocrine   Hypothyroidism    Most recent TSH 444 /lower limit normal / cont levothyroxin 125 mcgm daily/recheck 1 year      Relevant Medications   levothyroxine (SYNTHROID, LEVOTHROID) 125 MCG tablet      Meds ordered this encounter  Medications  . levothyroxine (SYNTHROID, LEVOTHROID) 125 MCG tablet    Sig: Take 1 tablet (125 mcg total) by mouth daily.    Dispense:  30 tablet    Refill:  5  . esomeprazole (NEXIUM) 40 MG capsule    Sig: Take 1 capsule (40 mg total) by mouth daily.    Dispense:  30 capsule    Refill:  5      Dr. Otilio Miu [facility]  08/29/17

## 2017-09-15 ENCOUNTER — Inpatient Hospital Stay: Payer: Medicare Other | Attending: Oncology

## 2017-09-15 VITALS — BP 156/90 | HR 74 | Temp 96.0°F | Resp 18

## 2017-09-15 DIAGNOSIS — N2889 Other specified disorders of kidney and ureter: Secondary | ICD-10-CM | POA: Diagnosis not present

## 2017-09-15 DIAGNOSIS — Z5112 Encounter for antineoplastic immunotherapy: Secondary | ICD-10-CM | POA: Diagnosis not present

## 2017-09-15 DIAGNOSIS — Z9221 Personal history of antineoplastic chemotherapy: Secondary | ICD-10-CM | POA: Insufficient documentation

## 2017-09-15 DIAGNOSIS — Z809 Family history of malignant neoplasm, unspecified: Secondary | ICD-10-CM | POA: Diagnosis not present

## 2017-09-15 DIAGNOSIS — C01 Malignant neoplasm of base of tongue: Secondary | ICD-10-CM

## 2017-09-15 DIAGNOSIS — K219 Gastro-esophageal reflux disease without esophagitis: Secondary | ICD-10-CM | POA: Diagnosis not present

## 2017-09-15 DIAGNOSIS — I700 Atherosclerosis of aorta: Secondary | ICD-10-CM | POA: Diagnosis not present

## 2017-09-15 DIAGNOSIS — Z923 Personal history of irradiation: Secondary | ICD-10-CM | POA: Diagnosis not present

## 2017-09-15 DIAGNOSIS — Z79899 Other long term (current) drug therapy: Secondary | ICD-10-CM | POA: Insufficient documentation

## 2017-09-15 DIAGNOSIS — I10 Essential (primary) hypertension: Secondary | ICD-10-CM | POA: Insufficient documentation

## 2017-09-15 DIAGNOSIS — E079 Disorder of thyroid, unspecified: Secondary | ICD-10-CM | POA: Diagnosis not present

## 2017-09-15 DIAGNOSIS — R911 Solitary pulmonary nodule: Secondary | ICD-10-CM | POA: Diagnosis not present

## 2017-09-15 LAB — COMPREHENSIVE METABOLIC PANEL
ALK PHOS: 39 U/L (ref 38–126)
ALT: 13 U/L (ref 0–44)
ANION GAP: 10 (ref 5–15)
AST: 20 U/L (ref 15–41)
Albumin: 3.7 g/dL (ref 3.5–5.0)
BILIRUBIN TOTAL: 0.9 mg/dL (ref 0.3–1.2)
BUN: 15 mg/dL (ref 8–23)
CALCIUM: 8.5 mg/dL — AB (ref 8.9–10.3)
CO2: 26 mmol/L (ref 22–32)
CREATININE: 1.64 mg/dL — AB (ref 0.61–1.24)
Chloride: 100 mmol/L (ref 98–111)
GFR, EST AFRICAN AMERICAN: 49 mL/min — AB (ref >60)
GFR, EST NON AFRICAN AMERICAN: 42 mL/min — AB (ref >60)
Glucose, Bld: 105 mg/dL — ABNORMAL HIGH (ref 70–99)
Potassium: 4.1 mmol/L (ref 3.5–5.1)
Sodium: 136 mmol/L (ref 135–145)
TOTAL PROTEIN: 7.1 g/dL (ref 6.5–8.1)

## 2017-09-15 LAB — CBC WITH DIFFERENTIAL/PLATELET
BASOS ABS: 0 10*3/uL (ref 0–0.1)
BASOS PCT: 1 %
Eosinophils Absolute: 0.2 10*3/uL (ref 0–0.7)
Eosinophils Relative: 5 %
HEMATOCRIT: 48.4 % (ref 40.0–52.0)
HEMOGLOBIN: 16.1 g/dL (ref 13.0–18.0)
Lymphocytes Relative: 21 %
Lymphs Abs: 0.8 10*3/uL — ABNORMAL LOW (ref 1.0–3.6)
MCH: 31.1 pg (ref 26.0–34.0)
MCHC: 33.2 g/dL (ref 32.0–36.0)
MCV: 93.6 fL (ref 80.0–100.0)
MONOS PCT: 13 %
Monocytes Absolute: 0.5 10*3/uL (ref 0.2–1.0)
NEUTROS ABS: 2.3 10*3/uL (ref 1.4–6.5)
NEUTROS PCT: 60 %
Platelets: 205 10*3/uL (ref 150–440)
RBC: 5.17 MIL/uL (ref 4.40–5.90)
RDW: 13.5 % (ref 11.5–14.5)
WBC: 3.8 10*3/uL (ref 3.8–10.6)

## 2017-09-15 MED ORDER — SODIUM CHLORIDE 0.9 % IV SOLN
200.0000 mg | Freq: Once | INTRAVENOUS | Status: AC
  Administered 2017-09-15: 200 mg via INTRAVENOUS
  Filled 2017-09-15: qty 8

## 2017-09-15 MED ORDER — SODIUM CHLORIDE 0.9 % IV SOLN
Freq: Once | INTRAVENOUS | Status: AC
  Administered 2017-09-15: 11:00:00 via INTRAVENOUS
  Filled 2017-09-15: qty 1000

## 2017-09-15 NOTE — Patient Instructions (Signed)
Pembrolizumab injection  What is this medicine?  PEMBROLIZUMAB (pem broe liz ue mab) is a monoclonal antibody. It is used to treat melanoma, head and neck cancer, Hodgkin lymphoma, non-small cell lung cancer, urothelial cancer, stomach cancer, and cancers that have a certain genetic condition.  This medicine may be used for other purposes; ask your health care provider or pharmacist if you have questions.  COMMON BRAND NAME(S): Keytruda  What should I tell my health care provider before I take this medicine?  They need to know if you have any of these conditions:  -diabetes  -immune system problems  -inflammatory bowel disease  -liver disease  -lung or breathing disease  -lupus  -organ transplant  -an unusual or allergic reaction to pembrolizumab, other medicines, foods, dyes, or preservatives  -pregnant or trying to get pregnant  -breast-feeding  How should I use this medicine?  This medicine is for infusion into a vein. It is given by a health care professional in a hospital or clinic setting.  A special MedGuide will be given to you before each treatment. Be sure to read this information carefully each time.  Talk to your pediatrician regarding the use of this medicine in children. While this drug may be prescribed for selected conditions, precautions do apply.  Overdosage: If you think you have taken too much of this medicine contact a poison control center or emergency room at once.  NOTE: This medicine is only for you. Do not share this medicine with others.  What if I miss a dose?  It is important not to miss your dose. Call your doctor or health care professional if you are unable to keep an appointment.  What may interact with this medicine?  Interactions have not been studied.  Give your health care provider a list of all the medicines, herbs, non-prescription drugs, or dietary supplements you use. Also tell them if you smoke, drink alcohol, or use illegal drugs. Some items may interact with your  medicine.  This list may not describe all possible interactions. Give your health care provider a list of all the medicines, herbs, non-prescription drugs, or dietary supplements you use. Also tell them if you smoke, drink alcohol, or use illegal drugs. Some items may interact with your medicine.  What should I watch for while using this medicine?  Your condition will be monitored carefully while you are receiving this medicine.  You may need blood work done while you are taking this medicine.  Do not become pregnant while taking this medicine or for 4 months after stopping it. Women should inform their doctor if they wish to become pregnant or think they might be pregnant. There is a potential for serious side effects to an unborn child. Talk to your health care professional or pharmacist for more information. Do not breast-feed an infant while taking this medicine or for 4 months after the last dose.  What side effects may I notice from receiving this medicine?  Side effects that you should report to your doctor or health care professional as soon as possible:  -allergic reactions like skin rash, itching or hives, swelling of the face, lips, or tongue  -bloody or black, tarry  -breathing problems  -changes in vision  -chest pain  -chills  -constipation  -cough  -dizziness or feeling faint or lightheaded  -fast or irregular heartbeat  -fever  -flushing  -hair loss  -low blood counts - this medicine may decrease the number of white blood cells, red blood cells   and platelets. You may be at increased risk for infections and bleeding.  -muscle pain  -muscle weakness  -persistent headache  -signs and symptoms of high blood sugar such as dizziness; dry mouth; dry skin; fruity breath; nausea; stomach pain; increased hunger or thirst; increased urination  -signs and symptoms of kidney injury like trouble passing urine or change in the amount of urine  -signs and symptoms of liver injury like dark urine, light-colored  stools, loss of appetite, nausea, right upper belly pain, yellowing of the eyes or skin  -stomach pain  -sweating  -weight loss  Side effects that usually do not require medical attention (report to your doctor or health care professional if they continue or are bothersome):  -decreased appetite  -diarrhea  -tiredness  This list may not describe all possible side effects. Call your doctor for medical advice about side effects. You may report side effects to FDA at 1-800-FDA-1088.  Where should I keep my medicine?  This drug is given in a hospital or clinic and will not be stored at home.  NOTE: This sheet is a summary. It may not cover all possible information. If you have questions about this medicine, talk to your doctor, pharmacist, or health care provider.   2018 Elsevier/Gold Standard (2015-11-10 12:29:36)

## 2017-09-16 LAB — THYROID PANEL WITH TSH
FREE THYROXINE INDEX: 2.5 (ref 1.2–4.9)
T3 Uptake Ratio: 32 % (ref 24–39)
T4 TOTAL: 7.7 ug/dL (ref 4.5–12.0)
TSH: 1.9 u[IU]/mL (ref 0.450–4.500)

## 2017-09-18 ENCOUNTER — Other Ambulatory Visit: Payer: Self-pay | Admitting: Family Medicine

## 2017-10-03 ENCOUNTER — Ambulatory Visit
Admission: RE | Admit: 2017-10-03 | Discharge: 2017-10-03 | Disposition: A | Discharge status: Home / Self Care | Payer: Medicare Other | Source: Ambulatory Visit | Attending: Oncology | Admitting: Oncology

## 2017-10-03 ENCOUNTER — Encounter (INDEPENDENT_AMBULATORY_CARE_PROVIDER_SITE_OTHER): Payer: Self-pay

## 2017-10-03 DIAGNOSIS — C01 Malignant neoplasm of base of tongue: Secondary | ICD-10-CM | POA: Diagnosis not present

## 2017-10-03 DIAGNOSIS — R911 Solitary pulmonary nodule: Secondary | ICD-10-CM | POA: Insufficient documentation

## 2017-10-03 DIAGNOSIS — I700 Atherosclerosis of aorta: Secondary | ICD-10-CM | POA: Diagnosis not present

## 2017-10-03 DIAGNOSIS — C029 Malignant neoplasm of tongue, unspecified: Secondary | ICD-10-CM | POA: Diagnosis not present

## 2017-10-03 MED ORDER — IOHEXOL 300 MG/ML  SOLN
75.0000 mL | Freq: Once | INTRAMUSCULAR | Status: AC | PRN
  Administered 2017-10-03: 75 mL via INTRAVENOUS

## 2017-10-03 NOTE — Progress Notes (Signed)
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  INTERVAL HISTORY: Patient returns to clinic today for further evaluation, discussion of his imaging results, and consideration of cycle 14 of maintenance Keytruda.  He continues to tolerate his treatments well without significant side effects.  He currently feels well and is asymptomatic.  He does not complain of dysphasia or difficulty swallowing today.  He has no neurologic complaints. He denies any recent fevers or illnesses.  He has a good appetite and denies weight loss. He denies any chest pain, cough, hemoptysis, or shortness of breath. He denies any nausea, vomiting, constipation, or diarrhea.  He has no urinary complaints.  Patient offers no specific complaints today.  REVIEW OF SYSTEMS:   Review of Systems  Constitutional: Negative.  Negative for fever, malaise/fatigue and weight loss.  HENT: Negative.  Negative for sore throat.   Respiratory: Negative.  Negative for cough, shortness of breath and stridor.   Cardiovascular: Negative.  Negative for chest pain and leg swelling.  Gastrointestinal: Negative.  Negative for abdominal pain, blood in stool and melena.  Genitourinary: Negative.  Negative for dysuria.  Musculoskeletal: Negative.  Negative for back pain and neck pain.  Skin: Negative.  Negative for itching and rash.  Neurological: Negative.  Negative for sensory change, focal weakness, weakness and headaches.  Psychiatric/Behavioral: Negative.  The patient is not nervous/anxious and does not have insomnia.     As per HPI. Otherwise, a complete review of systems is negative.  PAST MEDICAL HISTORY: Past Medical History:  Diagnosis Date  . Cancer ([place])   . GERD (gastroesophageal  reflux disease)   . Hypertension   . Thyroid disease     PAST SURGICAL HISTORY: Past Surgical History:  Procedure Laterality Date  . tumor removed      FAMILY HISTORY: Family History  Problem Relation Age of Onset  . Cancer Mother   . Cancer Sister   . Cancer Maternal Aunt   . Cancer Paternal Uncle     ADVANCED DIRECTIVES (Y/N):  N  HEALTH MAINTENANCE: Social History   Tobacco Use  . Smoking status: Never Smoker  . Smokeless tobacco: Never Used  Substance Use Topics  . Alcohol use: No    Frequency: Never  . Drug use: No     Colonoscopy:  PAP:  Bone density:  Lipid panel:  No Known Allergies  Current Outpatient Medications  Medication Sig Dispense Refill  . acetaminophen (TYLENOL) 650 MG CR tablet Take 1,300 mg by mouth every 8 (eight) hours.    Marland Kitchen esomeprazole (NEXIUM) 40 MG capsule Take 1 capsule (40 mg total) by mouth daily. 30 capsule 5  . levothyroxine (SYNTHROID, LEVOTHROID) 125 MCG tablet Take 1 tablet (125 mcg total) by mouth daily. 30 tablet 5  . PREVIDENT 0.2 % SOLN AS DIRECTED ON LABEL. 473 mL 0  . sildenafil (VIAGRA) 100 MG tablet Take 0.5-1 tablets (50-100 mg total) by mouth daily as needed for erectile dysfunction. 5 tablet 11  . SODIUM FLUORIDE, DENTAL RINSE, 0.05 % SOLN Use as directed in the mouth or throat.     No current facility-administered medications for this visit.     OBJECTIVE: There were no vitals filed for this visit.   There is no height or weight on file to calculate BMI.  ECOG FS:0 - Asymptomatic  General: Well-developed, well-nourished, no acute distress. Eyes: Pink conjunctiva, anicteric sclera. HEENT: Normocephalic, moist mucous membranes, clear oropharnyx.  No palpable lymphadenopathy. Lungs: Clear to auscultation bilaterally. Heart: Regular rate and rhythm. No rubs, murmurs, or gallops. Abdomen: Soft, nontender, nondistended. No organomegaly noted, normoactive bowel sounds. Musculoskeletal: No edema, cyanosis, or  clubbing. Neuro: Alert, answering all questions appropriately. Cranial nerves grossly intact. Skin: No rashes or petechiae noted. Psych: Normal affect.  LAB RESULTS:  Lab Results  Component Value Date   NA 139 10/06/2017   K 4.0 10/06/2017   CL 102 10/06/2017   CO2 26 10/06/2017   GLUCOSE 113 (H) 10/06/2017   BUN 14 10/06/2017   CREATININE 1.77 (H) 10/06/2017   CALCIUM 9.2 10/06/2017   PROT 7.4 10/06/2017   ALBUMIN 3.9 10/06/2017   AST 24 10/06/2017   ALT 15 10/06/2017   ALKPHOS 43 10/06/2017   BILITOT 0.5 10/06/2017   GFRNONAA 38 (L) 10/06/2017   GFRAA 44 (L) 10/06/2017    Lab Results  Component Value Date   WBC 5.0 10/06/2017   NEUTROABS 2.9 10/06/2017   HGB 16.0 10/06/2017   HCT 48.8 10/06/2017   MCV 92.0 10/06/2017   PLT 246 10/06/2017     STUDIES: Ct Soft Tissue Neck W Contrast  Result Date: 10/03/2017 CLINICAL DATA:  Recurrent squamous cell carcinoma of the base of tongue. Prior chemotherapy and radiation therapy. EXAM: CT NECK WITH CONTRAST TECHNIQUE: Multidetector CT imaging of the neck was performed using the standard protocol following the bolus administration of intravenous contrast. CONTRAST:  40mL OMNIPAQUE IOHEXOL 300 MG/ML  SOLN COMPARISON:  PET-CT 04/12/2017 FINDINGS: Pharynx and larynx: Unchanged mild tongue base asymmetry with apparent volume loss on the right. No enhancing pharyngeal mass. Mild epiglottic thickening, likely related to prior radiation therapy. Salivary glands: Unchanged atrophic appearance of the submandibular glands. Surgical clips in the right parotid region with unchanged soft tissue thickening extending to the skin surface without focal enhancing mass and without significantly increased FDG uptake reported on the prior PET-CT. Thyroid: Unremarkable. Lymph nodes: No enlarged or suspicious lymph nodes in the neck. Vascular: Major vascular structures of the neck are patent. Limited intracranial: Unremarkable. Visualized orbits:  Unremarkable. Mastoids and visualized paranasal sinuses: Clear. Skeleton: Prior extraction of all maxillary teeth with unchanged mottled appearance of the maxilla diffusely, possibly related to radiation therapy. Mild-to-moderate cervical disc degeneration. Upper chest: Reported separately. Other: None. IMPRESSION: Post treatment changes in the neck including presumed postsurgical soft tissue thickening in the right parotid region. No definite evidence of recurrent disease. Electronically Signed   By: Logan Bores M.D.   On: 10/03/2017 14:30   Ct Chest W Contrast  Result Date: 10/03/2017 CLINICAL DATA:  Patient with history of squamous cell carcinoma of the base of the tongue. Patient status post chemotherapy and radiation. EXAM: CT CHEST WITH CONTRAST TECHNIQUE: Multidetector CT imaging of the chest was performed during intravenous contrast administration. CONTRAST:  56mL OMNIPAQUE IOHEXOL 300 MG/ML  SOLN COMPARISON:  PET-CT 04/12/2017 FINDINGS: Cardiovascular: Normal heart size. Trace fluid superior pericardial recess. Thoracic aortic vascular calcifications. Mediastinum/Nodes: Normal appearance of the esophagus. No enlarged axillary, mediastinal or hilar lymphadenopathy. Lungs/Pleura: Central airways are patent. Dependent atelectasis. Stable 2 mm right upper lobe nodule (image 72; series 4). No pleural effusion or pneumothorax. Upper Abdomen: 1.8 cm cyst within the hepatic dome. 1.8 cm cyst within the caudate lobe. Gallbladder is unremarkable. No intrahepatic or extrahepatic biliary ductal dilatation. Musculoskeletal: Thoracic spine degenerative changes. No aggressive  or acute appearing osseous lesions. IMPRESSION: No evidence for metastatic disease within the chest. 2 mm right upper lobe nodule.  Recommend attention on follow-up. Aortic Atherosclerosis (ICD10-I70.0). Electronically Signed   By: Lovey Newcomer M.D.   On: 10/03/2017 17:58    Oncology history: Patient was originally diagnosed with squamous cell  carcinoma of the base of tongue (stage T3 N3 M0, p16+) in October 2016.  He underwent concurrent chemotherapy and radiation with cisplatin starting in February 2017 and completing on May 21, 2015.  Follow-up PET scan on September 02, 2015 was reported as equivocal.  Repeat PET scan on December 07, 2015 reported abnormal FDG uptake, but this was thought to be reactive from prior radiation therapy.  PET scan on Jul 05, 2016 revealed a level 2 lymph node increasing in size and an SUV.  Biopsy in June 2019 confirmed recurrence.  Patient had several delays in treatment, but was finally scheduled for surgery on October 21, 2016.  Unfortunately, he was found that the recurrence was inoperable.  Patient initiated palliative Beryle Flock every 3 weeks on October 27, 2016.    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  PLAN:    1. 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  CT scan results from October 03, 2017 reviewed independently and report as above with no evidence of recurrent or progressive disease. Patient was previously given a referral to ENT to establish care.  Proceed with cycle 14 of Keytruda today.  Return to clinic in 3 weeks for consideration of cycle 15 and then in 6 weeks for further evaluation and consideration of cycle 16.  Plan to reimage in approximately 6 months in February 2020.  If patient continues to have no evidence of disease at that point, will consider discontinuing Beryle Flock which will be 1 year of treatment. 2.  Rash: Unrelated to [facility].  Resolved with Medrol Dosepak.  Possibly a contact dermatitis, monitor. 3.  Renal insufficiency: Chronic and unchanged.  Patient's creatinine is 1.77 today which is approximately his baseline. 4.  Suppressed TSH: Patient's most recent thyroid panel is within normal limits.  Monitor.   Patient expressed understanding and was in agreement with this plan. He also understands that He can call  clinic at any time with any questions, concerns, or complaints.   Cancer Staging No matching staging information was found for the patient.  Lloyd Huger, MD   10/06/2017 12:48 PM

## 2017-10-04 ENCOUNTER — Ambulatory Visit: Admission: RE | Admit: 2017-10-04 | Payer: Medicare Other | Source: Ambulatory Visit

## 2017-10-06 ENCOUNTER — Inpatient Hospital Stay (HOSPITAL_BASED_OUTPATIENT_CLINIC_OR_DEPARTMENT_OTHER): Payer: Medicare Other | Admitting: Oncology

## 2017-10-06 ENCOUNTER — Inpatient Hospital Stay: Payer: Medicare Other

## 2017-10-06 VITALS — BP 161/95 | HR 62 | Resp 18

## 2017-10-06 DIAGNOSIS — Z923 Personal history of irradiation: Secondary | ICD-10-CM | POA: Diagnosis not present

## 2017-10-06 DIAGNOSIS — Z79899 Other long term (current) drug therapy: Secondary | ICD-10-CM | POA: Diagnosis not present

## 2017-10-06 DIAGNOSIS — Z9221 Personal history of antineoplastic chemotherapy: Secondary | ICD-10-CM

## 2017-10-06 DIAGNOSIS — C01 Malignant neoplasm of base of tongue: Secondary | ICD-10-CM

## 2017-10-06 DIAGNOSIS — Z5112 Encounter for antineoplastic immunotherapy: Secondary | ICD-10-CM | POA: Diagnosis not present

## 2017-10-06 DIAGNOSIS — N2889 Other specified disorders of kidney and ureter: Secondary | ICD-10-CM | POA: Diagnosis not present

## 2017-10-06 DIAGNOSIS — R911 Solitary pulmonary nodule: Secondary | ICD-10-CM | POA: Diagnosis not present

## 2017-10-06 DIAGNOSIS — E079 Disorder of thyroid, unspecified: Secondary | ICD-10-CM | POA: Diagnosis not present

## 2017-10-06 DIAGNOSIS — K219 Gastro-esophageal reflux disease without esophagitis: Secondary | ICD-10-CM | POA: Diagnosis not present

## 2017-10-06 DIAGNOSIS — I10 Essential (primary) hypertension: Secondary | ICD-10-CM | POA: Diagnosis not present

## 2017-10-06 DIAGNOSIS — Z809 Family history of malignant neoplasm, unspecified: Secondary | ICD-10-CM

## 2017-10-06 DIAGNOSIS — I700 Atherosclerosis of aorta: Secondary | ICD-10-CM | POA: Diagnosis not present

## 2017-10-06 LAB — COMPREHENSIVE METABOLIC PANEL
ALK PHOS: 43 U/L (ref 38–126)
ALT: 15 U/L (ref 0–44)
ANION GAP: 11 (ref 5–15)
AST: 24 U/L (ref 15–41)
Albumin: 3.9 g/dL (ref 3.5–5.0)
BILIRUBIN TOTAL: 0.5 mg/dL (ref 0.3–1.2)
BUN: 14 mg/dL (ref 8–23)
CALCIUM: 9.2 mg/dL (ref 8.9–10.3)
CO2: 26 mmol/L (ref 22–32)
CREATININE: 1.77 mg/dL — AB (ref 0.61–1.24)
Chloride: 102 mmol/L (ref 98–111)
GFR calc non Af Amer: 38 mL/min — ABNORMAL LOW (ref >60)
GFR, EST AFRICAN AMERICAN: 44 mL/min — AB (ref >60)
Glucose, Bld: 113 mg/dL — ABNORMAL HIGH (ref 70–99)
Potassium: 4 mmol/L (ref 3.5–5.1)
Sodium: 139 mmol/L (ref 135–145)
Total Protein: 7.4 g/dL (ref 6.5–8.1)

## 2017-10-06 LAB — CBC WITH DIFFERENTIAL/PLATELET
BASOS ABS: 0.1 10*3/uL (ref 0–0.1)
Basophils Relative: 1 %
EOS PCT: 4 %
Eosinophils Absolute: 0.2 10*3/uL (ref 0–0.7)
HCT: 48.8 % (ref 40.0–52.0)
HEMOGLOBIN: 16 g/dL (ref 13.0–18.0)
LYMPHS ABS: 1.3 10*3/uL (ref 1.0–3.6)
Lymphocytes Relative: 25 %
MCH: 30.2 pg (ref 26.0–34.0)
MCHC: 32.8 g/dL (ref 32.0–36.0)
MCV: 92 fL (ref 80.0–100.0)
Monocytes Absolute: 0.5 10*3/uL (ref 0.2–1.0)
Monocytes Relative: 11 %
NEUTROS PCT: 59 %
Neutro Abs: 2.9 10*3/uL (ref 1.4–6.5)
PLATELETS: 246 10*3/uL (ref 150–440)
RBC: 5.31 MIL/uL (ref 4.40–5.90)
RDW: 13.7 % (ref 11.5–14.5)
WBC: 5 10*3/uL (ref 3.8–10.6)

## 2017-10-06 MED ORDER — SODIUM CHLORIDE 0.9 % IV SOLN
200.0000 mg | Freq: Once | INTRAVENOUS | Status: AC
  Administered 2017-10-06: 200 mg via INTRAVENOUS
  Filled 2017-10-06: qty 8

## 2017-10-06 MED ORDER — SODIUM CHLORIDE 0.9 % IV SOLN
Freq: Once | INTRAVENOUS | Status: AC
  Administered 2017-10-06: 10:00:00 via INTRAVENOUS
  Filled 2017-10-06: qty 250

## 2017-10-06 NOTE — Patient Instructions (Signed)
Pembrolizumab injection  What is this medicine?  PEMBROLIZUMAB (pem broe liz ue mab) is a monoclonal antibody. It is used to treat melanoma, head and neck cancer, Hodgkin lymphoma, non-small cell lung cancer, urothelial cancer, stomach cancer, and cancers that have a certain genetic condition.  This medicine may be used for other purposes; ask your health care provider or pharmacist if you have questions.  COMMON BRAND NAME(S): Keytruda  What should I tell my health care provider before I take this medicine?  They need to know if you have any of these conditions:  -diabetes  -immune system problems  -inflammatory bowel disease  -liver disease  -lung or breathing disease  -lupus  -organ transplant  -an unusual or allergic reaction to pembrolizumab, other medicines, foods, dyes, or preservatives  -pregnant or trying to get pregnant  -breast-feeding  How should I use this medicine?  This medicine is for infusion into a vein. It is given by a health care professional in a hospital or clinic setting.  A special MedGuide will be given to you before each treatment. Be sure to read this information carefully each time.  Talk to your pediatrician regarding the use of this medicine in children. While this drug may be prescribed for selected conditions, precautions do apply.  Overdosage: If you think you have taken too much of this medicine contact a poison control center or emergency room at once.  NOTE: This medicine is only for you. Do not share this medicine with others.  What if I miss a dose?  It is important not to miss your dose. Call your doctor or health care professional if you are unable to keep an appointment.  What may interact with this medicine?  Interactions have not been studied.  Give your health care provider a list of all the medicines, herbs, non-prescription drugs, or dietary supplements you use. Also tell them if you smoke, drink alcohol, or use illegal drugs. Some items may interact with your  medicine.  This list may not describe all possible interactions. Give your health care provider a list of all the medicines, herbs, non-prescription drugs, or dietary supplements you use. Also tell them if you smoke, drink alcohol, or use illegal drugs. Some items may interact with your medicine.  What should I watch for while using this medicine?  Your condition will be monitored carefully while you are receiving this medicine.  You may need blood work done while you are taking this medicine.  Do not become pregnant while taking this medicine or for 4 months after stopping it. Women should inform their doctor if they wish to become pregnant or think they might be pregnant. There is a potential for serious side effects to an unborn child. Talk to your health care professional or pharmacist for more information. Do not breast-feed an infant while taking this medicine or for 4 months after the last dose.  What side effects may I notice from receiving this medicine?  Side effects that you should report to your doctor or health care professional as soon as possible:  -allergic reactions like skin rash, itching or hives, swelling of the face, lips, or tongue  -bloody or black, tarry  -breathing problems  -changes in vision  -chest pain  -chills  -constipation  -cough  -dizziness or feeling faint or lightheaded  -fast or irregular heartbeat  -fever  -flushing  -hair loss  -low blood counts - this medicine may decrease the number of white blood cells, red blood cells   and platelets. You may be at increased risk for infections and bleeding.  -muscle pain  -muscle weakness  -persistent headache  -signs and symptoms of high blood sugar such as dizziness; dry mouth; dry skin; fruity breath; nausea; stomach pain; increased hunger or thirst; increased urination  -signs and symptoms of kidney injury like trouble passing urine or change in the amount of urine  -signs and symptoms of liver injury like dark urine, light-colored  stools, loss of appetite, nausea, right upper belly pain, yellowing of the eyes or skin  -stomach pain  -sweating  -weight loss  Side effects that usually do not require medical attention (report to your doctor or health care professional if they continue or are bothersome):  -decreased appetite  -diarrhea  -tiredness  This list may not describe all possible side effects. Call your doctor for medical advice about side effects. You may report side effects to FDA at 1-800-FDA-1088.  Where should I keep my medicine?  This drug is given in a hospital or clinic and will not be stored at home.  NOTE: This sheet is a summary. It may not cover all possible information. If you have questions about this medicine, talk to your doctor, pharmacist, or health care provider.   2018 Elsevier/Gold Standard (2015-11-10 12:29:36)

## 2017-10-07 LAB — THYROID PANEL WITH TSH
Free Thyroxine Index: 2.2 (ref 1.2–4.9)
T3 Uptake Ratio: 27 % (ref 24–39)
T4, Total: 8 ug/dL (ref 4.5–12.0)
TSH: 8.21 u[IU]/mL — ABNORMAL HIGH (ref 0.450–4.500)

## 2017-10-27 ENCOUNTER — Inpatient Hospital Stay: Payer: Medicare Other

## 2017-10-27 ENCOUNTER — Inpatient Hospital Stay: Payer: Medicare Other | Attending: Oncology

## 2017-10-27 VITALS — BP 149/98 | HR 76 | Temp 94.0°F | Resp 18

## 2017-10-27 DIAGNOSIS — E039 Hypothyroidism, unspecified: Secondary | ICD-10-CM | POA: Insufficient documentation

## 2017-10-27 DIAGNOSIS — C01 Malignant neoplasm of base of tongue: Secondary | ICD-10-CM | POA: Insufficient documentation

## 2017-10-27 DIAGNOSIS — Z5112 Encounter for antineoplastic immunotherapy: Secondary | ICD-10-CM | POA: Insufficient documentation

## 2017-10-27 LAB — COMPREHENSIVE METABOLIC PANEL
ALK PHOS: 41 U/L (ref 38–126)
ALT: 14 U/L (ref 0–44)
AST: 23 U/L (ref 15–41)
Albumin: 3.8 g/dL (ref 3.5–5.0)
Anion gap: 9 (ref 5–15)
BILIRUBIN TOTAL: 0.9 mg/dL (ref 0.3–1.2)
BUN: 17 mg/dL (ref 8–23)
CALCIUM: 8.6 mg/dL — AB (ref 8.9–10.3)
CO2: 24 mmol/L (ref 22–32)
CREATININE: 1.6 mg/dL — AB (ref 0.61–1.24)
Chloride: 103 mmol/L (ref 98–111)
GFR calc Af Amer: 50 mL/min — ABNORMAL LOW (ref >60)
GFR, EST NON AFRICAN AMERICAN: 43 mL/min — AB (ref >60)
Glucose, Bld: 112 mg/dL — ABNORMAL HIGH (ref 70–99)
POTASSIUM: 3.5 mmol/L (ref 3.5–5.1)
Sodium: 136 mmol/L (ref 135–145)
TOTAL PROTEIN: 7.4 g/dL (ref 6.5–8.1)

## 2017-10-27 LAB — CBC WITH DIFFERENTIAL/PLATELET
BASOS ABS: 0 10*3/uL (ref 0–0.1)
Basophils Relative: 1 %
EOS PCT: 3 %
Eosinophils Absolute: 0.1 10*3/uL (ref 0–0.7)
HCT: 49.3 % (ref 40.0–52.0)
Hemoglobin: 16.3 g/dL (ref 13.0–18.0)
Lymphocytes Relative: 26 %
Lymphs Abs: 1.1 10*3/uL (ref 1.0–3.6)
MCH: 30.5 pg (ref 26.0–34.0)
MCHC: 33.1 g/dL (ref 32.0–36.0)
MCV: 92.2 fL (ref 80.0–100.0)
Monocytes Absolute: 0.6 10*3/uL (ref 0.2–1.0)
Monocytes Relative: 13 %
Neutro Abs: 2.4 10*3/uL (ref 1.4–6.5)
Neutrophils Relative %: 57 %
PLATELETS: 203 10*3/uL (ref 150–440)
RBC: 5.35 MIL/uL (ref 4.40–5.90)
RDW: 14.5 % (ref 11.5–14.5)
WBC: 4.3 10*3/uL (ref 3.8–10.6)

## 2017-10-27 MED ORDER — SODIUM CHLORIDE 0.9 % IV SOLN
Freq: Once | INTRAVENOUS | Status: AC
  Administered 2017-10-27: 11:00:00 via INTRAVENOUS
  Filled 2017-10-27: qty 250

## 2017-10-27 MED ORDER — SODIUM CHLORIDE 0.9 % IV SOLN
200.0000 mg | Freq: Once | INTRAVENOUS | Status: AC
  Administered 2017-10-27: 200 mg via INTRAVENOUS
  Filled 2017-10-27: qty 8

## 2017-10-27 NOTE — Patient Instructions (Signed)
Pembrolizumab injection  What is this medicine?  PEMBROLIZUMAB (pem broe liz ue mab) is a monoclonal antibody. It is used to treat melanoma, head and neck cancer, Hodgkin lymphoma, non-small cell lung cancer, urothelial cancer, stomach cancer, and cancers that have a certain genetic condition.  This medicine may be used for other purposes; ask your health care provider or pharmacist if you have questions.  COMMON BRAND NAME(S): Keytruda  What should I tell my health care provider before I take this medicine?  They need to know if you have any of these conditions:  -diabetes  -immune system problems  -inflammatory bowel disease  -liver disease  -lung or breathing disease  -lupus  -organ transplant  -an unusual or allergic reaction to pembrolizumab, other medicines, foods, dyes, or preservatives  -pregnant or trying to get pregnant  -breast-feeding  How should I use this medicine?  This medicine is for infusion into a vein. It is given by a health care professional in a hospital or clinic setting.  A special MedGuide will be given to you before each treatment. Be sure to read this information carefully each time.  Talk to your pediatrician regarding the use of this medicine in children. While this drug may be prescribed for selected conditions, precautions do apply.  Overdosage: If you think you have taken too much of this medicine contact a poison control center or emergency room at once.  NOTE: This medicine is only for you. Do not share this medicine with others.  What if I miss a dose?  It is important not to miss your dose. Call your doctor or health care professional if you are unable to keep an appointment.  What may interact with this medicine?  Interactions have not been studied.  Give your health care provider a list of all the medicines, herbs, non-prescription drugs, or dietary supplements you use. Also tell them if you smoke, drink alcohol, or use illegal drugs. Some items may interact with your  medicine.  This list may not describe all possible interactions. Give your health care provider a list of all the medicines, herbs, non-prescription drugs, or dietary supplements you use. Also tell them if you smoke, drink alcohol, or use illegal drugs. Some items may interact with your medicine.  What should I watch for while using this medicine?  Your condition will be monitored carefully while you are receiving this medicine.  You may need blood work done while you are taking this medicine.  Do not become pregnant while taking this medicine or for 4 months after stopping it. Women should inform their doctor if they wish to become pregnant or think they might be pregnant. There is a potential for serious side effects to an unborn child. Talk to your health care professional or pharmacist for more information. Do not breast-feed an infant while taking this medicine or for 4 months after the last dose.  What side effects may I notice from receiving this medicine?  Side effects that you should report to your doctor or health care professional as soon as possible:  -allergic reactions like skin rash, itching or hives, swelling of the face, lips, or tongue  -bloody or black, tarry  -breathing problems  -changes in vision  -chest pain  -chills  -constipation  -cough  -dizziness or feeling faint or lightheaded  -fast or irregular heartbeat  -fever  -flushing  -hair loss  -low blood counts - this medicine may decrease the number of white blood cells, red blood cells   and platelets. You may be at increased risk for infections and bleeding.  -muscle pain  -muscle weakness  -persistent headache  -signs and symptoms of high blood sugar such as dizziness; dry mouth; dry skin; fruity breath; nausea; stomach pain; increased hunger or thirst; increased urination  -signs and symptoms of kidney injury like trouble passing urine or change in the amount of urine  -signs and symptoms of liver injury like dark urine, light-colored  stools, loss of appetite, nausea, right upper belly pain, yellowing of the eyes or skin  -stomach pain  -sweating  -weight loss  Side effects that usually do not require medical attention (report to your doctor or health care professional if they continue or are bothersome):  -decreased appetite  -diarrhea  -tiredness  This list may not describe all possible side effects. Call your doctor for medical advice about side effects. You may report side effects to FDA at 1-800-FDA-1088.  Where should I keep my medicine?  This drug is given in a hospital or clinic and will not be stored at home.  NOTE: This sheet is a summary. It may not cover all possible information. If you have questions about this medicine, talk to your doctor, pharmacist, or health care provider.   2018 Elsevier/Gold Standard (2015-11-10 12:29:36)

## 2017-10-28 LAB — THYROID PANEL WITH TSH
FREE THYROXINE INDEX: 3 (ref 1.2–4.9)
T3 UPTAKE RATIO: 34 % (ref 24–39)
T4, Total: 8.7 ug/dL (ref 4.5–12.0)
TSH: 1.35 u[IU]/mL (ref 0.450–4.500)

## 2017-11-13 NOTE — Progress Notes (Signed)
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  INTERVAL HISTORY: Patient returns to clinic today for further evaluation and consideration of cycle 16 of maintenance Keytruda.  He currently feels well and is asymptomatic.  He is tolerating his treatments without significant side effects. He does not complain of dysphasia or difficulty swallowing today.  He has no neurologic complaints. He denies any recent fevers or illnesses.  He has a good appetite and denies weight loss. He denies any chest pain, cough, hemoptysis, or shortness of breath. He denies any nausea, vomiting, constipation, or diarrhea.  He has no urinary complaints.  Patient feels at his baseline offers no specific complaints today.  REVIEW OF SYSTEMS:   Review of Systems  Constitutional: Negative.  Negative for fever, malaise/fatigue and weight loss.  HENT: Negative.  Negative for sore throat.   Respiratory: Negative.  Negative for cough, shortness of breath and stridor.   Cardiovascular: Negative.  Negative for chest pain and leg swelling.  Gastrointestinal: Negative.  Negative for abdominal pain, blood in stool and melena.  Genitourinary: Negative.  Negative for dysuria.  Musculoskeletal: Negative.  Negative for back pain and neck pain.  Skin: Negative.  Negative for itching and rash.  Neurological: Negative.  Negative for sensory change, focal weakness, weakness and headaches.  Psychiatric/Behavioral: Negative.  The patient is not nervous/anxious and does not have insomnia.     As per HPI. Otherwise, a complete review of systems is negative.  PAST MEDICAL HISTORY: Past Medical History:  Diagnosis Date  . Cancer ([place])   . GERD (gastroesophageal reflux disease)   . Hypertension    . Thyroid disease     PAST SURGICAL HISTORY: Past Surgical History:  Procedure Laterality Date  . tumor removed      FAMILY HISTORY: Family History  Problem Relation Age of Onset  . Cancer Mother   . Cancer Sister   . Cancer Maternal Aunt   . Cancer Paternal Uncle     ADVANCED DIRECTIVES (Y/N):  N  HEALTH MAINTENANCE: Social History   Tobacco Use  . Smoking status: Never Smoker  . Smokeless tobacco: Never Used  Substance Use Topics  . Alcohol use: No    Frequency: Never  . Drug use: No     Colonoscopy:  PAP:  Bone density:  Lipid panel:  No Known Allergies  Current Outpatient Medications  Medication Sig Dispense Refill  . acetaminophen (TYLENOL) 650 MG CR tablet Take 1,300 mg by mouth every 8 (eight) hours.    Marland Kitchen esomeprazole (NEXIUM) 40 MG capsule Take 1 capsule (40 mg total) by mouth daily. 30 capsule 5  . levothyroxine (SYNTHROID, LEVOTHROID) 125 MCG tablet Take 1 tablet (125 mcg total) by mouth daily. 30 tablet 5  . PREVIDENT 0.2 % SOLN AS DIRECTED ON LABEL. 473 mL 0  . sildenafil (VIAGRA) 100 MG tablet Take 0.5-1 tablets (50-100 mg total) by mouth daily as needed for erectile dysfunction. 5 tablet 11  . SODIUM FLUORIDE, DENTAL RINSE, 0.05 % SOLN Use as directed in the mouth or throat.     No current facility-administered medications for this visit.     OBJECTIVE: Vitals:   11/17/17 1013  BP: (!) 167/84  Pulse: 90  Resp: 20  Temp: (!) 97.4 F (36.3 C)  Body mass index is 26.93 kg/m.    ECOG FS:0 - Asymptomatic  General: Well-developed, well-nourished, no acute distress. Eyes: Pink conjunctiva, anicteric sclera. HEENT: Normocephalic, moist mucous membranes, clear oropharnyx.  No palpable lymphadenopathy. Lungs: Clear to auscultation bilaterally. Heart: Regular rate and rhythm. No rubs, murmurs, or gallops. Abdomen: Soft, nontender, nondistended. No organomegaly noted, normoactive bowel sounds. Musculoskeletal: No edema, cyanosis, or  clubbing. Neuro: Alert, answering all questions appropriately. Cranial nerves grossly intact. Skin: No rashes or petechiae noted. Psych: Normal affect.  LAB RESULTS:  Lab Results  Component Value Date   NA 137 11/17/2017   K 3.8 11/17/2017   CL 101 11/17/2017   CO2 29 11/17/2017   GLUCOSE 176 (H) 11/17/2017   BUN 19 11/17/2017   CREATININE 1.68 (H) 11/17/2017   CALCIUM 8.7 (L) 11/17/2017   PROT 7.5 11/17/2017   ALBUMIN 4.0 11/17/2017   AST 24 11/17/2017   ALT 14 11/17/2017   ALKPHOS 46 11/17/2017   BILITOT 0.9 11/17/2017   GFRNONAA 41 (L) 11/17/2017   GFRAA 47 (L) 11/17/2017    Lab Results  Component Value Date   WBC 4.7 11/17/2017   NEUTROABS 3.0 11/17/2017   HGB 16.0 11/17/2017   HCT 48.7 11/17/2017   MCV 92.6 11/17/2017   PLT 225 11/17/2017     STUDIES: No results found.  Oncology history: Patient was originally diagnosed with squamous cell carcinoma of the base of tongue (stage T3 N3 M0, p16+) in October 2016.  He underwent concurrent chemotherapy and radiation with cisplatin starting in February 2017 and completing on May 21, 2015.  Follow-up PET scan on September 02, 2015 was reported as equivocal.  Repeat PET scan on December 07, 2015 reported abnormal FDG uptake, but this was thought to be reactive from prior radiation therapy.  PET scan on Jul 05, 2016 revealed a level 2 lymph node increasing in size and an SUV.  Biopsy in June 2019 confirmed recurrence.  Patient had several delays in treatment, but was finally scheduled for surgery on October 21, 2016.  Unfortunately, he was found that the recurrence was inoperable.  Patient initiated palliative Beryle Flock every 3 weeks on October 27, 2016.    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  PLAN:    1. 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  CT scan results from October 03, 2017 reviewed independently with no evidence of recurrent or progressive  disease. Patient was previously given a referral to ENT to establish care.  Proceed with cycle 16 of maintenance Keytruda today.  Return to clinic in 3 weeks for treatment only and then in 6 weeks for further evaluation and consideration of cycle 18. Plan to reimage in approximately February 2020.  If patient continues to have no evidence of disease at that point, will consider discontinuing Keytruda. 2.  Rash: Resolved with Medrol Dosepak.  Possibly a contact dermatitis, monitor. 3.  Renal insufficiency: Chronic and unchanged.  Creatinine is 1.68 today which is approximately his baseline.   4.  Thyroid: TSH and thyroid panel continue to be within normal limits.   Patient expressed understanding and was in agreement with this plan. He also understands that He can call clinic at any time with any questions, concerns, or complaints.   Cancer Staging No matching staging information was found for the patient.  Lloyd Huger, MD   11/19/2017 9:55 AM

## 2017-11-17 ENCOUNTER — Encounter: Payer: Self-pay | Admitting: Oncology

## 2017-11-17 ENCOUNTER — Inpatient Hospital Stay: Payer: Medicare Other

## 2017-11-17 ENCOUNTER — Inpatient Hospital Stay: Payer: Medicare Other | Attending: Oncology | Admitting: Oncology

## 2017-11-17 VITALS — BP 167/84 | HR 90 | Temp 97.4°F | Resp 20 | Wt 172.0 lb

## 2017-11-17 DIAGNOSIS — I10 Essential (primary) hypertension: Secondary | ICD-10-CM

## 2017-11-17 DIAGNOSIS — Z809 Family history of malignant neoplasm, unspecified: Secondary | ICD-10-CM | POA: Insufficient documentation

## 2017-11-17 DIAGNOSIS — C01 Malignant neoplasm of base of tongue: Secondary | ICD-10-CM

## 2017-11-17 DIAGNOSIS — K219 Gastro-esophageal reflux disease without esophagitis: Secondary | ICD-10-CM | POA: Insufficient documentation

## 2017-11-17 DIAGNOSIS — E079 Disorder of thyroid, unspecified: Secondary | ICD-10-CM | POA: Diagnosis not present

## 2017-11-17 DIAGNOSIS — Z79899 Other long term (current) drug therapy: Secondary | ICD-10-CM | POA: Diagnosis not present

## 2017-11-17 DIAGNOSIS — N2889 Other specified disorders of kidney and ureter: Secondary | ICD-10-CM

## 2017-11-17 DIAGNOSIS — Z5112 Encounter for antineoplastic immunotherapy: Secondary | ICD-10-CM | POA: Insufficient documentation

## 2017-11-17 DIAGNOSIS — R21 Rash and other nonspecific skin eruption: Secondary | ICD-10-CM | POA: Diagnosis not present

## 2017-11-17 LAB — CBC WITH DIFFERENTIAL/PLATELET
BASOS ABS: 0 10*3/uL (ref 0–0.1)
BASOS PCT: 1 %
Eosinophils Absolute: 0.1 10*3/uL (ref 0–0.7)
Eosinophils Relative: 3 %
HCT: 48.7 % (ref 40.0–52.0)
HEMOGLOBIN: 16 g/dL (ref 13.0–18.0)
Lymphocytes Relative: 22 %
Lymphs Abs: 1 10*3/uL (ref 1.0–3.6)
MCH: 30.4 pg (ref 26.0–34.0)
MCHC: 32.9 g/dL (ref 32.0–36.0)
MCV: 92.6 fL (ref 80.0–100.0)
MONOS PCT: 11 %
Monocytes Absolute: 0.5 10*3/uL (ref 0.2–1.0)
Neutro Abs: 3 10*3/uL (ref 1.4–6.5)
Neutrophils Relative %: 63 %
Platelets: 225 10*3/uL (ref 150–440)
RBC: 5.26 MIL/uL (ref 4.40–5.90)
RDW: 14.5 % (ref 11.5–14.5)
WBC: 4.7 10*3/uL (ref 3.8–10.6)

## 2017-11-17 LAB — COMPREHENSIVE METABOLIC PANEL
ALK PHOS: 46 U/L (ref 38–126)
ALT: 14 U/L (ref 0–44)
AST: 24 U/L (ref 15–41)
Albumin: 4 g/dL (ref 3.5–5.0)
Anion gap: 7 (ref 5–15)
BUN: 19 mg/dL (ref 8–23)
CALCIUM: 8.7 mg/dL — AB (ref 8.9–10.3)
CO2: 29 mmol/L (ref 22–32)
Chloride: 101 mmol/L (ref 98–111)
Creatinine, Ser: 1.68 mg/dL — ABNORMAL HIGH (ref 0.61–1.24)
GFR calc non Af Amer: 41 mL/min — ABNORMAL LOW (ref >60)
GFR, EST AFRICAN AMERICAN: 47 mL/min — AB (ref >60)
Glucose, Bld: 176 mg/dL — ABNORMAL HIGH (ref 70–99)
Potassium: 3.8 mmol/L (ref 3.5–5.1)
SODIUM: 137 mmol/L (ref 135–145)
Total Bilirubin: 0.9 mg/dL (ref 0.3–1.2)
Total Protein: 7.5 g/dL (ref 6.5–8.1)

## 2017-11-17 MED ORDER — SODIUM CHLORIDE 0.9 % IV SOLN
200.0000 mg | Freq: Once | INTRAVENOUS | Status: AC
  Administered 2017-11-17: 200 mg via INTRAVENOUS
  Filled 2017-11-17: qty 8

## 2017-11-17 MED ORDER — SODIUM CHLORIDE 0.9 % IV SOLN
Freq: Once | INTRAVENOUS | Status: AC
  Administered 2017-11-17: 11:00:00 via INTRAVENOUS
  Filled 2017-11-17: qty 250

## 2017-11-17 NOTE — Progress Notes (Signed)
Patient denies any concerns today.  

## 2017-11-18 LAB — THYROID PANEL WITH TSH
Free Thyroxine Index: 2.6 (ref 1.2–4.9)
T3 UPTAKE RATIO: 33 % (ref 24–39)
T4 TOTAL: 8 ug/dL (ref 4.5–12.0)
TSH: 0.757 u[IU]/mL (ref 0.450–4.500)

## 2017-11-24 ENCOUNTER — Other Ambulatory Visit: Payer: Medicare Other

## 2017-11-24 ENCOUNTER — Ambulatory Visit: Payer: Medicare Other

## 2017-12-08 ENCOUNTER — Inpatient Hospital Stay: Payer: Medicare Other

## 2017-12-08 VITALS — BP 127/82 | HR 73 | Temp 96.0°F | Resp 18

## 2017-12-08 DIAGNOSIS — C01 Malignant neoplasm of base of tongue: Secondary | ICD-10-CM | POA: Diagnosis not present

## 2017-12-08 DIAGNOSIS — Z5112 Encounter for antineoplastic immunotherapy: Secondary | ICD-10-CM | POA: Diagnosis not present

## 2017-12-08 DIAGNOSIS — R21 Rash and other nonspecific skin eruption: Secondary | ICD-10-CM | POA: Diagnosis not present

## 2017-12-08 DIAGNOSIS — E079 Disorder of thyroid, unspecified: Secondary | ICD-10-CM | POA: Diagnosis not present

## 2017-12-08 DIAGNOSIS — N2889 Other specified disorders of kidney and ureter: Secondary | ICD-10-CM | POA: Diagnosis not present

## 2017-12-08 DIAGNOSIS — K219 Gastro-esophageal reflux disease without esophagitis: Secondary | ICD-10-CM | POA: Diagnosis not present

## 2017-12-08 LAB — COMPREHENSIVE METABOLIC PANEL
ALBUMIN: 3.9 g/dL (ref 3.5–5.0)
ALK PHOS: 40 U/L (ref 38–126)
ALT: 14 U/L (ref 0–44)
ANION GAP: 10 (ref 5–15)
AST: 20 U/L (ref 15–41)
BILIRUBIN TOTAL: 0.7 mg/dL (ref 0.3–1.2)
BUN: 15 mg/dL (ref 8–23)
CALCIUM: 9 mg/dL (ref 8.9–10.3)
CO2: 28 mmol/L (ref 22–32)
Chloride: 101 mmol/L (ref 98–111)
Creatinine, Ser: 1.69 mg/dL — ABNORMAL HIGH (ref 0.61–1.24)
GFR calc Af Amer: 47 mL/min — ABNORMAL LOW (ref >60)
GFR calc non Af Amer: 40 mL/min — ABNORMAL LOW (ref >60)
GLUCOSE: 108 mg/dL — AB (ref 70–99)
POTASSIUM: 3.7 mmol/L (ref 3.5–5.1)
Sodium: 139 mmol/L (ref 135–145)
TOTAL PROTEIN: 7.5 g/dL (ref 6.5–8.1)

## 2017-12-08 LAB — CBC WITH DIFFERENTIAL/PLATELET
Abs Immature Granulocytes: 0.01 10*3/uL (ref 0.00–0.07)
BASOS PCT: 1 %
Basophils Absolute: 0.1 10*3/uL (ref 0.0–0.1)
EOS ABS: 0.2 10*3/uL (ref 0.0–0.5)
Eosinophils Relative: 3 %
HCT: 47.5 % (ref 39.0–52.0)
Hemoglobin: 15.6 g/dL (ref 13.0–17.0)
IMMATURE GRANULOCYTES: 0 %
Lymphocytes Relative: 26 %
Lymphs Abs: 1.2 10*3/uL (ref 0.7–4.0)
MCH: 30.4 pg (ref 26.0–34.0)
MCHC: 32.8 g/dL (ref 30.0–36.0)
MCV: 92.6 fL (ref 80.0–100.0)
MONO ABS: 0.6 10*3/uL (ref 0.1–1.0)
MONOS PCT: 13 %
Neutro Abs: 2.6 10*3/uL (ref 1.7–7.7)
Neutrophils Relative %: 57 %
PLATELETS: 175 10*3/uL (ref 150–400)
RBC: 5.13 MIL/uL (ref 4.22–5.81)
RDW: 13.2 % (ref 11.5–15.5)
WBC: 4.6 10*3/uL (ref 4.0–10.5)
nRBC: 0 % (ref 0.0–0.2)

## 2017-12-08 MED ORDER — SODIUM CHLORIDE 0.9 % IV SOLN
200.0000 mg | Freq: Once | INTRAVENOUS | Status: AC
  Administered 2017-12-08: 200 mg via INTRAVENOUS
  Filled 2017-12-08: qty 8

## 2017-12-08 MED ORDER — SODIUM CHLORIDE 0.9 % IV SOLN
Freq: Once | INTRAVENOUS | Status: AC
  Administered 2017-12-08: 10:00:00 via INTRAVENOUS
  Filled 2017-12-08: qty 250

## 2017-12-09 LAB — THYROID PANEL WITH TSH
FREE THYROXINE INDEX: 2.6 (ref 1.2–4.9)
T3 UPTAKE RATIO: 30 % (ref 24–39)
T4 TOTAL: 8.8 ug/dL (ref 4.5–12.0)
TSH: 0.6 u[IU]/mL (ref 0.450–4.500)

## 2017-12-24 NOTE — Progress Notes (Signed)
[place]  Telephone:(336) [phone] Fax:(336) [phone]  ID: Troy [name] OB: 01-Aug-1950  MR#: [id number]  WOE#:[id number]  Patient Care Team: Juline Patch, MD as PCP - General (Family Medicine)  CHIEF COMPLAINT: Recurrent squamous cell carcinoma of base of tongue.  INTERVAL HISTORY: Patient returns to clinic today for further evaluation and consideration of cycle 18 of maintenance Keytruda.  He continues to feel well and remains asymptomatic. He is tolerating his treatments without significant side effects. He does not complain of dysphasia or difficulty swallowing today.  He has no neurologic complaints. He denies any recent fevers or illnesses.  He has a good appetite and denies weight loss. He denies any chest pain, cough, hemoptysis, or shortness of breath. He denies any nausea, vomiting, constipation, or diarrhea.  He has no urinary complaints.  Patient feels at his baseline offers no specific complaints today.  REVIEW OF SYSTEMS:   Review of Systems  Constitutional: Negative.  Negative for fever, malaise/fatigue and weight loss.  HENT: Negative.  Negative for sore throat.   Respiratory: Negative.  Negative for cough, shortness of breath and stridor.   Cardiovascular: Negative.  Negative for chest pain and leg swelling.  Gastrointestinal: Negative.  Negative for abdominal pain, blood in stool and melena.  Genitourinary: Negative.  Negative for dysuria.  Musculoskeletal: Negative.  Negative for back pain and neck pain.  Skin: Negative.  Negative for itching and rash.  Neurological: Negative.  Negative for sensory change, focal weakness, weakness and headaches.  Psychiatric/Behavioral: Negative.  The patient is not nervous/anxious and does not have insomnia.     As per HPI. Otherwise, a complete review of systems is negative.  PAST MEDICAL HISTORY: Past Medical History:  Diagnosis Date  . Cancer ([place])   . GERD (gastroesophageal reflux disease)   .  Hypertension   . Thyroid disease     PAST SURGICAL HISTORY: Past Surgical History:  Procedure Laterality Date  . tumor removed      FAMILY HISTORY: Family History  Problem Relation Age of Onset  . Cancer Mother   . Cancer Sister   . Cancer Maternal Aunt   . Cancer Paternal Uncle     ADVANCED DIRECTIVES (Y/N):  N  HEALTH MAINTENANCE: Social History   Tobacco Use  . Smoking status: Never Smoker  . Smokeless tobacco: Never Used  Substance Use Topics  . Alcohol use: No    Frequency: Never  . Drug use: No     Colonoscopy:  PAP:  Bone density:  Lipid panel:  No Known Allergies  Current Outpatient Medications  Medication Sig Dispense Refill  . acetaminophen (TYLENOL) 650 MG CR tablet Take 1,300 mg by mouth every 8 (eight) hours.    Marland Kitchen esomeprazole (NEXIUM) 40 MG capsule Take 1 capsule (40 mg total) by mouth daily. 30 capsule 5  . levothyroxine (SYNTHROID, LEVOTHROID) 125 MCG tablet Take 1 tablet (125 mcg total) by mouth daily. 30 tablet 5  . PREVIDENT 0.2 % SOLN AS DIRECTED ON LABEL. 473 mL 0  . sildenafil (VIAGRA) 100 MG tablet Take 0.5-1 tablets (50-100 mg total) by mouth daily as needed for erectile dysfunction. 5 tablet 11  . SODIUM FLUORIDE, DENTAL RINSE, 0.05 % SOLN Use as directed in the mouth or throat.     No current facility-administered medications for this visit.     OBJECTIVE: Vitals:   12/29/17 0952  BP: (!) 174/91  Pulse: 72  Resp: 18  Temp: (!) 97.4 F (36.3 C)  Body mass index is 27.31 kg/m.    ECOG FS:0 - Asymptomatic  General: Well-developed, well-nourished, no acute distress. Eyes: Pink conjunctiva, anicteric sclera. HEENT: Normocephalic, moist mucous membranes, clear oropharnyx.  No palpable lymphadenopathy. Lungs: Clear to auscultation bilaterally. Heart: Regular rate and rhythm. No rubs, murmurs, or gallops. Abdomen: Soft, nontender, nondistended. No organomegaly noted, normoactive bowel sounds. Musculoskeletal: No edema,  cyanosis, or clubbing. Neuro: Alert, answering all questions appropriately. Cranial nerves grossly intact. Skin: No rashes or petechiae noted. Psych: Normal affect.  LAB RESULTS:  Lab Results  Component Value Date   NA 138 12/29/2017   K 3.4 (L) 12/29/2017   CL 104 12/29/2017   CO2 25 12/29/2017   GLUCOSE 125 (H) 12/29/2017   BUN 15 12/29/2017   CREATININE 1.62 (H) 12/29/2017   CALCIUM 8.9 12/29/2017   PROT 7.4 12/29/2017   ALBUMIN 3.8 12/29/2017   AST 27 12/29/2017   ALT 16 12/29/2017   ALKPHOS 43 12/29/2017   BILITOT 0.6 12/29/2017   GFRNONAA 42 (L) 12/29/2017   GFRAA 49 (L) 12/29/2017    Lab Results  Component Value Date   WBC 5.7 12/29/2017   NEUTROABS 3.4 12/29/2017   HGB 15.7 12/29/2017   HCT 47.9 12/29/2017   MCV 92.8 12/29/2017   PLT 197 12/29/2017     STUDIES: No results found.  Oncology history: Patient was originally diagnosed with squamous cell carcinoma of the base of tongue (stage T3 N3 M0, p16+) in October 2016.  He underwent concurrent chemotherapy and radiation with cisplatin starting in February 2017 and completing on May 21, 2015.  Follow-up PET scan on September 02, 2015 was reported as equivocal.  Repeat PET scan on December 07, 2015 reported abnormal FDG uptake, but this was thought to be reactive from prior radiation therapy.  PET scan on Jul 05, 2016 revealed a level 2 lymph node increasing in size and an SUV.  Biopsy in June 2019 confirmed recurrence.  Patient had several delays in treatment, but was finally scheduled for surgery on October 21, 2016.  Unfortunately, he was found that the recurrence was inoperable.  Patient initiated palliative Beryle Flock every 3 weeks on October 27, 2016.    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  PLAN:    1. 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  CT scan results from October 03, 2017 reviewed independently with no evidence of recurrent or  progressive disease. Patient was previously given a referral to ENT to establish care.  Proceed with cycle 18 of maintenance Keytruda today.  Return to clinic in 3 weeks for treatment only and then in 6 weeks for further evaluation and consideration of cycle 20. Plan to reimage in approximately February 2020.  If patient continues to have no evidence of disease at that point, will consider discontinuing Keytruda. 2.  Chronic renal insufficiency: Patient's creatinine is 1.62 today which is approximately his baseline, monitor. 3.  Hypokalemia: Patient was given dietary changes. 4.  Thyroid: TSH and thyroid panel continue to be within normal limits.   Patient expressed understanding and was in agreement with this plan. He also understands that He can call clinic at any time with any questions, concerns, or complaints.   Cancer Staging No matching staging information was found for the patient.  Lloyd Huger, MD   12/29/2017 2:03 PM

## 2017-12-29 ENCOUNTER — Inpatient Hospital Stay: Payer: Medicare Other | Attending: Oncology | Admitting: Oncology

## 2017-12-29 ENCOUNTER — Encounter: Payer: Self-pay | Admitting: Oncology

## 2017-12-29 ENCOUNTER — Inpatient Hospital Stay: Payer: Medicare Other

## 2017-12-29 VITALS — BP 174/91 | HR 72 | Temp 97.4°F | Resp 18 | Wt 174.4 lb

## 2017-12-29 DIAGNOSIS — I129 Hypertensive chronic kidney disease with stage 1 through stage 4 chronic kidney disease, or unspecified chronic kidney disease: Secondary | ICD-10-CM | POA: Diagnosis not present

## 2017-12-29 DIAGNOSIS — Z800 Family history of malignant neoplasm of digestive organs: Secondary | ICD-10-CM | POA: Insufficient documentation

## 2017-12-29 DIAGNOSIS — E079 Disorder of thyroid, unspecified: Secondary | ICD-10-CM | POA: Diagnosis not present

## 2017-12-29 DIAGNOSIS — Z79899 Other long term (current) drug therapy: Secondary | ICD-10-CM | POA: Diagnosis not present

## 2017-12-29 DIAGNOSIS — Z5112 Encounter for antineoplastic immunotherapy: Secondary | ICD-10-CM

## 2017-12-29 DIAGNOSIS — K219 Gastro-esophageal reflux disease without esophagitis: Secondary | ICD-10-CM

## 2017-12-29 DIAGNOSIS — C01 Malignant neoplasm of base of tongue: Secondary | ICD-10-CM | POA: Insufficient documentation

## 2017-12-29 DIAGNOSIS — N189 Chronic kidney disease, unspecified: Secondary | ICD-10-CM | POA: Diagnosis not present

## 2017-12-29 DIAGNOSIS — E876 Hypokalemia: Secondary | ICD-10-CM | POA: Diagnosis not present

## 2017-12-29 DIAGNOSIS — I10 Essential (primary) hypertension: Secondary | ICD-10-CM | POA: Insufficient documentation

## 2017-12-29 LAB — CBC WITH DIFFERENTIAL/PLATELET
Abs Immature Granulocytes: 0.03 10*3/uL (ref 0.00–0.07)
BASOS ABS: 0 10*3/uL (ref 0.0–0.1)
Basophils Relative: 1 %
EOS ABS: 0.3 10*3/uL (ref 0.0–0.5)
EOS PCT: 4 %
HCT: 47.9 % (ref 39.0–52.0)
Hemoglobin: 15.7 g/dL (ref 13.0–17.0)
Immature Granulocytes: 1 %
LYMPHS ABS: 1.2 10*3/uL (ref 0.7–4.0)
LYMPHS PCT: 21 %
MCH: 30.4 pg (ref 26.0–34.0)
MCHC: 32.8 g/dL (ref 30.0–36.0)
MCV: 92.8 fL (ref 80.0–100.0)
Monocytes Absolute: 0.8 10*3/uL (ref 0.1–1.0)
Monocytes Relative: 13 %
NEUTROS PCT: 60 %
NRBC: 0 % (ref 0.0–0.2)
Neutro Abs: 3.4 10*3/uL (ref 1.7–7.7)
Platelets: 197 10*3/uL (ref 150–400)
RBC: 5.16 MIL/uL (ref 4.22–5.81)
RDW: 12.8 % (ref 11.5–15.5)
WBC: 5.7 10*3/uL (ref 4.0–10.5)

## 2017-12-29 LAB — COMPREHENSIVE METABOLIC PANEL
ALBUMIN: 3.8 g/dL (ref 3.5–5.0)
ALT: 16 U/L (ref 0–44)
ANION GAP: 9 (ref 5–15)
AST: 27 U/L (ref 15–41)
Alkaline Phosphatase: 43 U/L (ref 38–126)
BUN: 15 mg/dL (ref 8–23)
CHLORIDE: 104 mmol/L (ref 98–111)
CO2: 25 mmol/L (ref 22–32)
CREATININE: 1.62 mg/dL — AB (ref 0.61–1.24)
Calcium: 8.9 mg/dL (ref 8.9–10.3)
GFR calc non Af Amer: 42 mL/min — ABNORMAL LOW (ref >60)
GFR, EST AFRICAN AMERICAN: 49 mL/min — AB (ref >60)
Glucose, Bld: 125 mg/dL — ABNORMAL HIGH (ref 70–99)
Potassium: 3.4 mmol/L — ABNORMAL LOW (ref 3.5–5.1)
SODIUM: 138 mmol/L (ref 135–145)
Total Bilirubin: 0.6 mg/dL (ref 0.3–1.2)
Total Protein: 7.4 g/dL (ref 6.5–8.1)

## 2017-12-29 MED ORDER — SODIUM CHLORIDE 0.9 % IV SOLN
200.0000 mg | Freq: Once | INTRAVENOUS | Status: AC
  Administered 2017-12-29: 200 mg via INTRAVENOUS
  Filled 2017-12-29: qty 8

## 2017-12-29 MED ORDER — SODIUM CHLORIDE 0.9 % IV SOLN
Freq: Once | INTRAVENOUS | Status: AC
  Administered 2017-12-29: 10:00:00 via INTRAVENOUS
  Filled 2017-12-29: qty 250

## 2017-12-29 NOTE — Progress Notes (Signed)
Patient denies any concerns today.  

## 2017-12-29 NOTE — Patient Instructions (Signed)
Pembrolizumab injection  What is this medicine?  PEMBROLIZUMAB (pem broe liz ue mab) is a monoclonal antibody. It is used to treat melanoma, head and neck cancer, Hodgkin lymphoma, non-small cell lung cancer, urothelial cancer, stomach cancer, and cancers that have a certain genetic condition.  This medicine may be used for other purposes; ask your health care provider or pharmacist if you have questions.  COMMON BRAND NAME(S): Keytruda  What should I tell my health care provider before I take this medicine?  They need to know if you have any of these conditions:  -diabetes  -immune system problems  -inflammatory bowel disease  -liver disease  -lung or breathing disease  -lupus  -organ transplant  -an unusual or allergic reaction to pembrolizumab, other medicines, foods, dyes, or preservatives  -pregnant or trying to get pregnant  -breast-feeding  How should I use this medicine?  This medicine is for infusion into a vein. It is given by a health care professional in a hospital or clinic setting.  A special MedGuide will be given to you before each treatment. Be sure to read this information carefully each time.  Talk to your pediatrician regarding the use of this medicine in children. While this drug may be prescribed for selected conditions, precautions do apply.  Overdosage: If you think you have taken too much of this medicine contact a poison control center or emergency room at once.  NOTE: This medicine is only for you. Do not share this medicine with others.  What if I miss a dose?  It is important not to miss your dose. Call your doctor or health care professional if you are unable to keep an appointment.  What may interact with this medicine?  Interactions have not been studied.  Give your health care provider a list of all the medicines, herbs, non-prescription drugs, or dietary supplements you use. Also tell them if you smoke, drink alcohol, or use illegal drugs. Some items may interact with your  medicine.  This list may not describe all possible interactions. Give your health care provider a list of all the medicines, herbs, non-prescription drugs, or dietary supplements you use. Also tell them if you smoke, drink alcohol, or use illegal drugs. Some items may interact with your medicine.  What should I watch for while using this medicine?  Your condition will be monitored carefully while you are receiving this medicine.  You may need blood work done while you are taking this medicine.  Do not become pregnant while taking this medicine or for 4 months after stopping it. Women should inform their doctor if they wish to become pregnant or think they might be pregnant. There is a potential for serious side effects to an unborn child. Talk to your health care professional or pharmacist for more information. Do not breast-feed an infant while taking this medicine or for 4 months after the last dose.  What side effects may I notice from receiving this medicine?  Side effects that you should report to your doctor or health care professional as soon as possible:  -allergic reactions like skin rash, itching or hives, swelling of the face, lips, or tongue  -bloody or black, tarry  -breathing problems  -changes in vision  -chest pain  -chills  -constipation  -cough  -dizziness or feeling faint or lightheaded  -fast or irregular heartbeat  -fever  -flushing  -hair loss  -low blood counts - this medicine may decrease the number of white blood cells, red blood cells   and platelets. You may be at increased risk for infections and bleeding.  -muscle pain  -muscle weakness  -persistent headache  -signs and symptoms of high blood sugar such as dizziness; dry mouth; dry skin; fruity breath; nausea; stomach pain; increased hunger or thirst; increased urination  -signs and symptoms of kidney injury like trouble passing urine or change in the amount of urine  -signs and symptoms of liver injury like dark urine, light-colored  stools, loss of appetite, nausea, right upper belly pain, yellowing of the eyes or skin  -stomach pain  -sweating  -weight loss  Side effects that usually do not require medical attention (report to your doctor or health care professional if they continue or are bothersome):  -decreased appetite  -diarrhea  -tiredness  This list may not describe all possible side effects. Call your doctor for medical advice about side effects. You may report side effects to FDA at 1-800-FDA-1088.  Where should I keep my medicine?  This drug is given in a hospital or clinic and will not be stored at home.  NOTE: This sheet is a summary. It may not cover all possible information. If you have questions about this medicine, talk to your doctor, pharmacist, or health care provider.   2018 Elsevier/Gold Standard (2015-11-10 12:29:36)

## 2017-12-30 LAB — THYROID PANEL WITH TSH
Free Thyroxine Index: 2.1 (ref 1.2–4.9)
T3 Uptake Ratio: 29 % (ref 24–39)
T4 TOTAL: 7.4 ug/dL (ref 4.5–12.0)
TSH: 4.23 u[IU]/mL (ref 0.450–4.500)

## 2018-01-19 ENCOUNTER — Inpatient Hospital Stay: Payer: Medicare Other | Attending: Oncology

## 2018-01-26 ENCOUNTER — Inpatient Hospital Stay: Payer: Medicare Other | Attending: Oncology

## 2018-01-26 VITALS — BP 147/90 | HR 73 | Temp 97.0°F | Resp 18

## 2018-01-26 DIAGNOSIS — C01 Malignant neoplasm of base of tongue: Secondary | ICD-10-CM | POA: Insufficient documentation

## 2018-01-26 DIAGNOSIS — Z5112 Encounter for antineoplastic immunotherapy: Secondary | ICD-10-CM | POA: Insufficient documentation

## 2018-01-26 MED ORDER — SODIUM CHLORIDE 0.9 % IV SOLN
Freq: Once | INTRAVENOUS | Status: AC
  Administered 2018-01-26: 14:00:00 via INTRAVENOUS
  Filled 2018-01-26: qty 250

## 2018-01-26 MED ORDER — SODIUM CHLORIDE 0.9 % IV SOLN
200.0000 mg | Freq: Once | INTRAVENOUS | Status: AC
  Administered 2018-01-26: 200 mg via INTRAVENOUS
  Filled 2018-01-26: qty 8

## 2018-02-09 ENCOUNTER — Ambulatory Visit: Payer: Medicare Other | Admitting: Oncology

## 2018-02-09 ENCOUNTER — Ambulatory Visit: Payer: Medicare Other

## 2018-02-09 ENCOUNTER — Other Ambulatory Visit: Payer: Medicare Other

## 2018-02-09 NOTE — Progress Notes (Signed)
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  INTERVAL HISTORY: Patient returns to clinic today for further evaluation and consideration of cycle 20 of maintenance Keytruda.  He continues to feel well and remains asymptomatic.  He does not complain of dysphasia or difficulty swallowing today.  He has no neurologic complaints. He denies any recent fevers or illnesses.  He has a good appetite and denies weight loss. He denies any chest pain, cough, hemoptysis, or shortness of breath. He denies any nausea, vomiting, constipation, or diarrhea.  He has no urinary complaints.  Patient feels at his baseline offers no specific complaints today.  REVIEW OF SYSTEMS:   Review of Systems  Constitutional: Negative.  Negative for fever, malaise/fatigue and weight loss.  HENT: Negative.  Negative for sore throat.   Respiratory: Negative.  Negative for cough, shortness of breath and stridor.   Cardiovascular: Negative.  Negative for chest pain and leg swelling.  Gastrointestinal: Negative.  Negative for abdominal pain, blood in stool and melena.  Genitourinary: Negative.  Negative for dysuria.  Musculoskeletal: Negative.  Negative for back pain and neck pain.  Skin: Negative.  Negative for itching and rash.  Neurological: Negative.  Negative for sensory change, focal weakness, weakness and headaches.  Psychiatric/Behavioral: Negative.  The patient is not nervous/anxious and does not have insomnia.     As per HPI. Otherwise, a complete review of systems is negative.  PAST MEDICAL HISTORY: Past Medical History:  Diagnosis Date  . Cancer ([place])   . GERD (gastroesophageal reflux disease)   . Hypertension   . Thyroid disease     PAST SURGICAL HISTORY: Past  Surgical History:  Procedure Laterality Date  . tumor removed      FAMILY HISTORY: Family History  Problem Relation Age of Onset  . Cancer Mother   . Cancer Sister   . Cancer Maternal Aunt   . Cancer Paternal Uncle     ADVANCED DIRECTIVES (Y/N):  N  HEALTH MAINTENANCE: Social History   Tobacco Use  . Smoking status: Never Smoker  . Smokeless tobacco: Never Used  Substance Use Topics  . Alcohol use: No    Frequency: Never  . Drug use: No     Colonoscopy:  PAP:  Bone density:  Lipid panel:  No Known Allergies  Current Outpatient Medications  Medication Sig Dispense Refill  . acetaminophen (TYLENOL) 650 MG CR tablet Take 1,300 mg by mouth every 8 (eight) hours.    Marland Kitchen esomeprazole (NEXIUM) 40 MG capsule Take 1 capsule (40 mg total) by mouth daily. 30 capsule 5  . levothyroxine (SYNTHROID, LEVOTHROID) 125 MCG tablet Take 1 tablet (125 mcg total) by mouth daily. 30 tablet 5  . PREVIDENT 0.2 % SOLN AS DIRECTED ON LABEL. 473 mL 0  . sildenafil (VIAGRA) 100 MG tablet Take 0.5-1 tablets (50-100 mg total) by mouth daily as needed for erectile dysfunction. 5 tablet 11   No current facility-administered medications for this visit.    Facility-Administered Medications Ordered in Other Visits  Medication Dose Route Frequency Provider Last Rate Last Dose  . pembrolizumab (KEYTRUDA) 200 mg in sodium chloride 0.9 % 50 mL chemo infusion  200 mg Intravenous Once Troy Huger, MD 116 mL/hr at 02/16/18 1011 200 mg at 02/16/18 1011    OBJECTIVE:  Vitals:   02/16/18 0904  BP: (!) 151/90  Pulse: 80  Temp: 97.9 F (36.6 C)     Body mass index is 27.38 kg/m.    ECOG FS:0 - Asymptomatic  General: Well-developed, well-nourished, no acute distress. Eyes: Pink conjunctiva, anicteric sclera. HEENT: Normocephalic, moist mucous membranes, clear oropharnyx.  No palpable lymphadenopathy. Lungs: Clear to auscultation bilaterally. Heart: Regular rate and rhythm. No rubs, murmurs, or  gallops. Abdomen: Soft, nontender, nondistended. No organomegaly noted, normoactive bowel sounds. Musculoskeletal: No edema, cyanosis, or clubbing. Neuro: Alert, answering all questions appropriately. Cranial nerves grossly intact. Skin: No rashes or petechiae noted. Psych: Normal affect.  LAB RESULTS:  Lab Results  Component Value Date   NA 140 02/16/2018   K 3.7 02/16/2018   CL 105 02/16/2018   CO2 26 02/16/2018   GLUCOSE 106 (H) 02/16/2018   BUN 15 02/16/2018   CREATININE 1.70 (H) 02/16/2018   CALCIUM 8.5 (L) 02/16/2018   PROT 7.3 02/16/2018   ALBUMIN 3.7 02/16/2018   AST 22 02/16/2018   ALT 14 02/16/2018   ALKPHOS 48 02/16/2018   BILITOT 0.6 02/16/2018   GFRNONAA 41 (L) 02/16/2018   GFRAA 47 (L) 02/16/2018    Lab Results  Component Value Date   WBC 5.4 02/16/2018   NEUTROABS 2.9 02/16/2018   HGB 16.0 02/16/2018   HCT 48.9 02/16/2018   MCV 92.8 02/16/2018   PLT 159 02/16/2018     STUDIES: No results found.  Oncology history: Patient was originally diagnosed with squamous cell carcinoma of the base of tongue (stage T3 N3 M0, p16+) in October 2016.  He underwent concurrent chemotherapy and radiation with cisplatin starting in February 2017 and completing on May 21, 2015.  Follow-up PET scan on September 02, 2015 was reported as equivocal.  Repeat PET scan on December 07, 2015 reported abnormal FDG uptake, but this was thought to be reactive from prior radiation therapy.  PET scan on Jul 05, 2016 revealed a level 2 lymph node increasing in size and an SUV.  Biopsy in June 2019 confirmed recurrence.  Patient had several delays in treatment, but was finally scheduled for surgery on October 21, 2016.  Unfortunately, he was found that the recurrence was inoperable.  Patient initiated palliative Beryle Flock every 3 weeks on October 27, 2016.    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  PLAN:    1. 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  CT scan results from October 03, 2017 reviewed independently with no evidence of recurrent or progressive disease. Patient was previously given a referral to ENT to establish care.  Proceed with cycle 20 of maintenance Keytruda today.  Return to clinic in 3 weeks for treatment only and then in 6 weeks for further evaluation and consideration of cycle 22.  Will reimage with CT scans prior to cycle 22 and if patient has no evidence of disease can consider discontinuing treatment.   2.  Chronic renal insufficiency: Patient's creatinine is 1.70 today which is approximately his baseline.  Continue to monitor.  3.  Hypokalemia: Resolved.  Patient was previously given dietary changes. 4.  Thyroid: TSH and thyroid panel continue to be within normal limits.   Patient expressed understanding and was in agreement with this plan. He also understands that He can call clinic at any time with any questions, concerns, or complaints.   Cancer Staging No matching staging information was found for the patient.  Kathlene November  Grayland Ormond, MD   02/16/2018 10:22 AM

## 2018-02-16 ENCOUNTER — Inpatient Hospital Stay (HOSPITAL_BASED_OUTPATIENT_CLINIC_OR_DEPARTMENT_OTHER): Payer: Medicare Other | Admitting: Oncology

## 2018-02-16 ENCOUNTER — Inpatient Hospital Stay: Payer: Medicare Other

## 2018-02-16 ENCOUNTER — Other Ambulatory Visit: Payer: Self-pay

## 2018-02-16 ENCOUNTER — Inpatient Hospital Stay: Payer: Medicare Other | Attending: Oncology

## 2018-02-16 VITALS — BP 151/90 | HR 80 | Temp 97.9°F | Ht 67.0 in | Wt 174.8 lb

## 2018-02-16 VITALS — BP 165/101 | HR 80 | Resp 18

## 2018-02-16 DIAGNOSIS — I10 Essential (primary) hypertension: Secondary | ICD-10-CM

## 2018-02-16 DIAGNOSIS — E079 Disorder of thyroid, unspecified: Secondary | ICD-10-CM

## 2018-02-16 DIAGNOSIS — N189 Chronic kidney disease, unspecified: Secondary | ICD-10-CM | POA: Insufficient documentation

## 2018-02-16 DIAGNOSIS — I129 Hypertensive chronic kidney disease with stage 1 through stage 4 chronic kidney disease, or unspecified chronic kidney disease: Secondary | ICD-10-CM

## 2018-02-16 DIAGNOSIS — Z923 Personal history of irradiation: Secondary | ICD-10-CM | POA: Insufficient documentation

## 2018-02-16 DIAGNOSIS — C01 Malignant neoplasm of base of tongue: Secondary | ICD-10-CM

## 2018-02-16 DIAGNOSIS — Z79899 Other long term (current) drug therapy: Secondary | ICD-10-CM | POA: Diagnosis not present

## 2018-02-16 DIAGNOSIS — Z809 Family history of malignant neoplasm, unspecified: Secondary | ICD-10-CM

## 2018-02-16 DIAGNOSIS — Z5112 Encounter for antineoplastic immunotherapy: Secondary | ICD-10-CM | POA: Diagnosis present

## 2018-02-16 DIAGNOSIS — K219 Gastro-esophageal reflux disease without esophagitis: Secondary | ICD-10-CM | POA: Diagnosis not present

## 2018-02-16 LAB — CBC WITH DIFFERENTIAL/PLATELET
Abs Immature Granulocytes: 0.02 10*3/uL (ref 0.00–0.07)
BASOS ABS: 0.1 10*3/uL (ref 0.0–0.1)
Basophils Relative: 1 %
EOS ABS: 0.3 10*3/uL (ref 0.0–0.5)
EOS PCT: 5 %
HCT: 48.9 % (ref 39.0–52.0)
HEMOGLOBIN: 16 g/dL (ref 13.0–17.0)
Immature Granulocytes: 0 %
LYMPHS PCT: 27 %
Lymphs Abs: 1.4 10*3/uL (ref 0.7–4.0)
MCH: 30.4 pg (ref 26.0–34.0)
MCHC: 32.7 g/dL (ref 30.0–36.0)
MCV: 92.8 fL (ref 80.0–100.0)
Monocytes Absolute: 0.7 10*3/uL (ref 0.1–1.0)
Monocytes Relative: 13 %
NRBC: 0 % (ref 0.0–0.2)
Neutro Abs: 2.9 10*3/uL (ref 1.7–7.7)
Neutrophils Relative %: 54 %
Platelets: 159 10*3/uL (ref 150–400)
RBC: 5.27 MIL/uL (ref 4.22–5.81)
RDW: 13.2 % (ref 11.5–15.5)
WBC: 5.4 10*3/uL (ref 4.0–10.5)

## 2018-02-16 LAB — COMPREHENSIVE METABOLIC PANEL
ALT: 14 U/L (ref 0–44)
AST: 22 U/L (ref 15–41)
Albumin: 3.7 g/dL (ref 3.5–5.0)
Alkaline Phosphatase: 48 U/L (ref 38–126)
Anion gap: 9 (ref 5–15)
BUN: 15 mg/dL (ref 8–23)
CHLORIDE: 105 mmol/L (ref 98–111)
CO2: 26 mmol/L (ref 22–32)
CREATININE: 1.7 mg/dL — AB (ref 0.61–1.24)
Calcium: 8.5 mg/dL — ABNORMAL LOW (ref 8.9–10.3)
GFR calc non Af Amer: 41 mL/min — ABNORMAL LOW (ref >60)
GFR, EST AFRICAN AMERICAN: 47 mL/min — AB (ref >60)
Glucose, Bld: 106 mg/dL — ABNORMAL HIGH (ref 70–99)
Potassium: 3.7 mmol/L (ref 3.5–5.1)
Sodium: 140 mmol/L (ref 135–145)
Total Bilirubin: 0.6 mg/dL (ref 0.3–1.2)
Total Protein: 7.3 g/dL (ref 6.5–8.1)

## 2018-02-16 MED ORDER — SODIUM CHLORIDE 0.9 % IV SOLN
Freq: Once | INTRAVENOUS | Status: AC
  Administered 2018-02-16: 10:00:00 via INTRAVENOUS
  Filled 2018-02-16: qty 250

## 2018-02-16 MED ORDER — SODIUM CHLORIDE 0.9 % IV SOLN
200.0000 mg | Freq: Once | INTRAVENOUS | Status: AC
  Administered 2018-02-16: 200 mg via INTRAVENOUS
  Filled 2018-02-16: qty 8

## 2018-02-16 NOTE — Progress Notes (Signed)
Patient is here today to follow up on his squamous cell carcinoma of base of tongue. Patient denied fever, chills, nausea, vomiting, diarrhea, constipation and pain. Patient had no complaints today.

## 2018-02-17 LAB — THYROID PANEL WITH TSH
FREE THYROXINE INDEX: 2.7 (ref 1.2–4.9)
T3 Uptake Ratio: 30 % (ref 24–39)
T4, Total: 9.1 ug/dL (ref 4.5–12.0)
TSH: 0.259 u[IU]/mL — AB (ref 0.450–4.500)

## 2018-03-09 ENCOUNTER — Inpatient Hospital Stay: Payer: Medicare Other

## 2018-03-09 VITALS — BP 159/94 | HR 76 | Temp 97.5°F | Resp 18

## 2018-03-09 DIAGNOSIS — I10 Essential (primary) hypertension: Secondary | ICD-10-CM | POA: Diagnosis not present

## 2018-03-09 DIAGNOSIS — K219 Gastro-esophageal reflux disease without esophagitis: Secondary | ICD-10-CM | POA: Diagnosis not present

## 2018-03-09 DIAGNOSIS — N189 Chronic kidney disease, unspecified: Secondary | ICD-10-CM | POA: Diagnosis not present

## 2018-03-09 DIAGNOSIS — C01 Malignant neoplasm of base of tongue: Secondary | ICD-10-CM

## 2018-03-09 DIAGNOSIS — Z5112 Encounter for antineoplastic immunotherapy: Secondary | ICD-10-CM | POA: Diagnosis not present

## 2018-03-09 DIAGNOSIS — I129 Hypertensive chronic kidney disease with stage 1 through stage 4 chronic kidney disease, or unspecified chronic kidney disease: Secondary | ICD-10-CM | POA: Diagnosis not present

## 2018-03-09 MED ORDER — SODIUM CHLORIDE 0.9 % IV SOLN
Freq: Once | INTRAVENOUS | Status: AC
  Administered 2018-03-09: 10:00:00 via INTRAVENOUS
  Filled 2018-03-09: qty 250

## 2018-03-09 MED ORDER — SODIUM CHLORIDE 0.9 % IV SOLN
200.0000 mg | Freq: Once | INTRAVENOUS | Status: AC
  Administered 2018-03-09: 200 mg via INTRAVENOUS
  Filled 2018-03-09: qty 8

## 2018-03-23 NOTE — Progress Notes (Deleted)
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  INTERVAL HISTORY: Patient returns to clinic today for further evaluation and consideration of cycle 20 of maintenance Keytruda.  He continues to feel well and remains asymptomatic.  He does not complain of dysphasia or difficulty swallowing today.  He has no neurologic complaints. He denies any recent fevers or illnesses.  He has a good appetite and denies weight loss. He denies any chest pain, cough, hemoptysis, or shortness of breath. He denies any nausea, vomiting, constipation, or diarrhea.  He has no urinary complaints.  Patient feels at his baseline offers no specific complaints today.  REVIEW OF SYSTEMS:   Review of Systems  Constitutional: Negative.  Negative for fever, malaise/fatigue and weight loss.  HENT: Negative.  Negative for sore throat.   Respiratory: Negative.  Negative for cough, shortness of breath and stridor.   Cardiovascular: Negative.  Negative for chest pain and leg swelling.  Gastrointestinal: Negative.  Negative for abdominal pain, blood in stool and melena.  Genitourinary: Negative.  Negative for dysuria.  Musculoskeletal: Negative.  Negative for back pain and neck pain.  Skin: Negative.  Negative for itching and rash.  Neurological: Negative.  Negative for sensory change, focal weakness, weakness and headaches.  Psychiatric/Behavioral: Negative.  The patient is not nervous/anxious and does not have insomnia.     As per HPI. Otherwise, a complete review of systems is negative.  PAST MEDICAL HISTORY: Past Medical History:  Diagnosis Date  . Cancer ([place])   . GERD (gastroesophageal reflux disease)   . Hypertension   . Thyroid disease     PAST SURGICAL HISTORY: Past  Surgical History:  Procedure Laterality Date  . tumor removed      FAMILY HISTORY: Family History  Problem Relation Age of Onset  . Cancer Mother   . Cancer Sister   . Cancer Maternal Aunt   . Cancer Paternal Uncle     ADVANCED DIRECTIVES (Y/N):  N  HEALTH MAINTENANCE: Social History   Tobacco Use  . Smoking status: Never Smoker  . Smokeless tobacco: Never Used  Substance Use Topics  . Alcohol use: No    Frequency: Never  . Drug use: No     Colonoscopy:  PAP:  Bone density:  Lipid panel:  No Known Allergies  Current Outpatient Medications  Medication Sig Dispense Refill  . acetaminophen (TYLENOL) 650 MG CR tablet Take 1,300 mg by mouth every 8 (eight) hours.    Marland Kitchen esomeprazole (NEXIUM) 40 MG capsule Take 1 capsule (40 mg total) by mouth daily. 30 capsule 5  . levothyroxine (SYNTHROID, LEVOTHROID) 125 MCG tablet Take 1 tablet (125 mcg total) by mouth daily. 30 tablet 5  . PREVIDENT 0.2 % SOLN AS DIRECTED ON LABEL. 473 mL 0  . sildenafil (VIAGRA) 100 MG tablet Take 0.5-1 tablets (50-100 mg total) by mouth daily as needed for erectile dysfunction. 5 tablet 11   No current facility-administered medications for this visit.     OBJECTIVE: There were no vitals filed for this visit.   There is no height or weight on file to calculate BMI.    ECOG FS:0 - Asymptomatic  General: Well-developed, well-nourished, no acute distress. Eyes: Pink conjunctiva, anicteric sclera. HEENT: Normocephalic, moist mucous membranes, clear oropharnyx.  No palpable lymphadenopathy. Lungs:  Clear to auscultation bilaterally. Heart: Regular rate and rhythm. No rubs, murmurs, or gallops. Abdomen: Soft, nontender, nondistended. No organomegaly noted, normoactive bowel sounds. Musculoskeletal: No edema, cyanosis, or clubbing. Neuro: Alert, answering all questions appropriately. Cranial nerves grossly intact. Skin: No rashes or petechiae noted. Psych: Normal affect.  LAB RESULTS:  Lab Results    Component Value Date   NA 140 02/16/2018   K 3.7 02/16/2018   CL 105 02/16/2018   CO2 26 02/16/2018   GLUCOSE 106 (H) 02/16/2018   BUN 15 02/16/2018   CREATININE 1.70 (H) 02/16/2018   CALCIUM 8.5 (L) 02/16/2018   PROT 7.3 02/16/2018   ALBUMIN 3.7 02/16/2018   AST 22 02/16/2018   ALT 14 02/16/2018   ALKPHOS 48 02/16/2018   BILITOT 0.6 02/16/2018   GFRNONAA 41 (L) 02/16/2018   GFRAA 47 (L) 02/16/2018    Lab Results  Component Value Date   WBC 5.4 02/16/2018   NEUTROABS 2.9 02/16/2018   HGB 16.0 02/16/2018   HCT 48.9 02/16/2018   MCV 92.8 02/16/2018   PLT 159 02/16/2018     STUDIES: No results found.  Oncology history: Patient was originally diagnosed with squamous cell carcinoma of the base of tongue (stage T3 N3 M0, p16+) in October 2016.  He underwent concurrent chemotherapy and radiation with cisplatin starting in February 2017 and completing on May 21, 2015.  Follow-up PET scan on September 02, 2015 was reported as equivocal.  Repeat PET scan on December 07, 2015 reported abnormal FDG uptake, but this was thought to be reactive from prior radiation therapy.  PET scan on Jul 05, 2016 revealed a level 2 lymph node increasing in size and an SUV.  Biopsy in June 2019 confirmed recurrence.  Patient had several delays in treatment, but was finally scheduled for surgery on October 21, 2016.  Unfortunately, he was found that the recurrence was inoperable.  Patient initiated palliative Beryle Flock every 3 weeks on October 27, 2016.    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  PLAN:    1. 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  CT scan results from October 03, 2017 reviewed independently with no evidence of recurrent or progressive disease. Patient was previously given a referral to ENT to establish care.  Proceed with cycle 20 of maintenance Keytruda today.  Return to clinic in 3 weeks for treatment only and then in 6  weeks for further evaluation and consideration of cycle 22.  Will reimage with CT scans prior to cycle 22 and if patient has no evidence of disease can consider discontinuing treatment.   2.  Chronic renal insufficiency: Patient's creatinine is 1.70 today which is approximately his baseline.  Continue to monitor.  3.  Hypokalemia: Resolved.  Patient was previously given dietary changes. 4.  Thyroid: TSH and thyroid panel continue to be within normal limits.   Patient expressed understanding and was in agreement with this plan. He also understands that He can call clinic at any time with any questions, concerns, or complaints.   Cancer Staging No matching staging information was found for the patient.  Lloyd Huger, MD   03/23/2018 1:39 PM

## 2018-03-28 ENCOUNTER — Ambulatory Visit: Admission: RE | Admit: 2018-03-28 | Payer: Medicare Other | Source: Ambulatory Visit

## 2018-03-30 ENCOUNTER — Ambulatory Visit: Payer: Medicare Other | Admitting: Oncology

## 2018-03-30 ENCOUNTER — Other Ambulatory Visit: Payer: Medicare Other

## 2018-03-30 ENCOUNTER — Ambulatory Visit: Payer: Medicare Other

## 2018-04-03 ENCOUNTER — Ambulatory Visit
Admission: RE | Admit: 2018-04-03 | Discharge: 2018-04-03 | Disposition: A | Discharge status: Home / Self Care | Payer: Medicare Other | Source: Ambulatory Visit | Attending: Oncology | Admitting: Oncology

## 2018-04-03 DIAGNOSIS — R911 Solitary pulmonary nodule: Secondary | ICD-10-CM | POA: Diagnosis not present

## 2018-04-03 DIAGNOSIS — C01 Malignant neoplasm of base of tongue: Secondary | ICD-10-CM | POA: Diagnosis not present

## 2018-04-03 DIAGNOSIS — Z85810 Personal history of malignant neoplasm of tongue: Secondary | ICD-10-CM | POA: Diagnosis not present

## 2018-04-03 MED ORDER — IOHEXOL 300 MG/ML  SOLN
100.0000 mL | Freq: Once | INTRAMUSCULAR | Status: AC | PRN
  Administered 2018-04-03: 100 mL via INTRAVENOUS

## 2018-04-05 NOTE — Progress Notes (Signed)
[facility] day:  04/06/2018  Chief Complaint: Troy [name] is a 68 y.o. male with recurrent squamous cell carcinoma of the base of tongue who is seen for new patient assessment and cycle #22 pembrolizumab.  HPI:   The patient presented with a 7.7 cm right neck mass in 11/2014. Direct laryngoscopy revealed a right base of tongue mass.  Biopsy showed p16+, moderately differentiated SCC.   PET scan revealed right BOT mass (SUV 20), right level 2 node (SUV 23), and left level 3 node (SUV 16.6).   Clinical stage was T3 N3 M0.  He received concurrent cisplatin and radiation from 02/03/017 - 05/21/2015.     PET scan on 09/02/2015 was equivocal.  PET scan on 12/07/2015 revealed abnormal FDG uptake in the right SCM and masseter muscles (SUV 7) (felt reactive from prior radiation therapy).  PET scan on 07/05/2016 revealed an enlarging level 2 lymph node (SUV 12.9).    Biopsy in 07/2017 confirmed recurrence.  Patient had several delays in treatment, but was finally scheduled for surgery on 09/072018. Unfortunately, his recurrence was inoperable.    Patient began palliative Keytruda every 3 weeks on 10/27/2016. He was last seen in the medical oncology clinic on 02/16/2018 by Dr Grayland Ormond  At that time, he denied any complaint.  He received cycle #20 pembrolizumab.  He received cycle #21 on 03/09/2018.  Neck and chest CT on 04/03/2018 revealed no evidence of residual or recurrent disease.  There are chronic post treatment changes of the tongue.  There is chronic scarring in the region of the right parotid and right neck.  During the interim, he has been feeling "alright". Patient denies that he has experienced any B symptoms. He denies any interval infections. He has some minor rhinorrhea. Following his radiation therapy, patient has experienced xerostomia for which he treats with "just increasing his water intake". Patient has chronic back pain; "I have had back  problems all of my life".   Patient advises that he maintains an adequate appetite. He is eating well. Weight today is 174 lb 13.2 oz (79.3 kg), which compared to his last visit to the clinic, represents a stable weight.    Patient denies pain in the clinic today.   Past Medical History:  Diagnosis Date  . Cancer ([place])   . GERD (gastroesophageal reflux disease)   . Hypertension   . Thyroid disease     Past Surgical History:  Procedure Laterality Date  . tumor removed      Family History  Problem Relation Age of Onset  . Cancer Mother   . Cancer Sister   . Cancer Maternal Aunt   . Cancer Paternal Uncle     Social History:  reports that he has never smoked. He has never used smokeless tobacco. He reports that he does not drink alcohol or use drugs.  He lives in [place]. The patient is accompanied by alone today.  Allergies: No Known Allergies  Current Medications: Current Outpatient Medications  Medication Sig Dispense Refill  . acetaminophen (TYLENOL) 650 MG CR tablet Take 1,300 mg by mouth every 8 (eight) hours.    Marland Kitchen esomeprazole (NEXIUM) 40 MG capsule Take 1 capsule (40 mg total) by mouth daily. 30 capsule 5  . levothyroxine (SYNTHROID, LEVOTHROID) 125 MCG tablet Take 1 tablet (125 mcg total) by mouth daily. 30 tablet 5  . sildenafil (VIAGRA) 100 MG tablet Take 0.5-1 tablets (50-100 mg total) by mouth daily as needed for  erectile dysfunction. 5 tablet 11   No current facility-administered medications for this visit.     Review of Systems:  GENERAL:  Feels "alright".  No fevers, sweats or weight loss. PERFORMANCE STATUS (ECOG):  0 HEENT:  Xerostomia.  Minor rhinorrhea.  No visual changes, sore throat, mouth sores or tenderness. Lungs: No shortness of breath or cough.  No hemoptysis. Cardiac:  No chest pain, palpitations, orthopnea, or PND. GI:  No nausea, vomiting, diarrhea, constipation, melena or hematochezia. GU:  No urgency, frequency, dysuria, or  hematuria. Musculoskeletal:  Chronic back pain.  No joint pain.  No muscle tenderness. Extremities:  No pain or swelling. Skin:  No rashes or skin changes. Neuro:  No headache, numbness or weakness, balance or coordination issues. Endocrine:  No diabetes.  Thyroid disease on Synthroid.  No hot flashes or night sweats. Psych:  No mood changes, depression or anxiety. Pain:  No focal pain. Review of systems:  All other systems reviewed and found to be negative.  Physical Exam: Blood pressure (!) 164/95, pulse 91, temperature 97.7 F (36.5 C), temperature source Oral, resp. rate 18, weight 174 lb 13.2 oz (79.3 kg), SpO2 100 %. GENERAL:  Well developed, well nourished, gentleman sitting comfortably in the exam room in no acute distress. MENTAL STATUS:  Alert and oriented to person, place and time. HEAD:  Wearing a black cap.  Dark hair.  Slight mustache.  Normocephalic, atraumatic, face symmetric, no Cushingoid features. EYES:  Brown  eyes.  Pupils equal round and reactive to light and accomodation.  No conjunctivitis or scleral icterus. ENT:  Oropharynx clear without lesion.  Tongue normal. Mucous membranes moist.  RESPIRATORY:  Clear to auscultation without rales, wheezes or rhonchi. CARDIOVASCULAR:  Regular rate and rhythm without murmur, rub or gallop. ABDOMEN:  Soft, non-tender, with active bowel sounds, and no hepatosplenomegaly.  No masses. SKIN:  No rashes, ulcers or lesions. EXTREMITIES: No edema, no skin discoloration or tenderness.  No palpable cords. LYMPH NODES: No palpable cervical, supraclavicular, axillary or inguinal adenopathy  NEUROLOGICAL: Unremarkable. PSYCH:  Appropriate.   Appointment on 04/06/2018  Component Date Value Ref Range Status  . Sodium 04/06/2018 137  135 - 145 mmol/L Final  . Potassium 04/06/2018 3.5  3.5 - 5.1 mmol/L Final  . Chloride 04/06/2018 102  98 - 111 mmol/L Final  . CO2 04/06/2018 28  22 - 32 mmol/L Final  . Glucose, Bld 04/06/2018 153* 70 -  99 mg/dL Final  . BUN 04/06/2018 15  8 - 23 mg/dL Final  . Creatinine, Ser 04/06/2018 1.68* 0.61 - 1.24 mg/dL Final  . Calcium 04/06/2018 8.6* 8.9 - 10.3 mg/dL Final  . Total Protein 04/06/2018 7.5  6.5 - 8.1 g/dL Final  . Albumin 04/06/2018 3.8  3.5 - 5.0 g/dL Final  . AST 04/06/2018 20  15 - 41 U/L Final  . ALT 04/06/2018 15  0 - 44 U/L Final  . Alkaline Phosphatase 04/06/2018 50  38 - 126 U/L Final  . Total Bilirubin 04/06/2018 0.5  0.3 - 1.2 mg/dL Final  . GFR calc non Af Amer 04/06/2018 41* >60 mL/min Final  . GFR calc Af Amer 04/06/2018 48* >60 mL/min Final  . Anion gap 04/06/2018 7  5 - 15 Final   Performed at [facility], [address] [place], [place] [address]  . WBC 04/06/2018 4.3  4.0 - 10.5 K/uL Final  . RBC 04/06/2018 5.54  4.22 - 5.81 MIL/uL Final  . Hemoglobin 04/06/2018 16.7  13.0 -  17.0 g/dL Final  . HCT 04/06/2018 51.7  39.0 - 52.0 % Final  . MCV 04/06/2018 93.3  80.0 - 100.0 fL Final  . MCH 04/06/2018 30.1  26.0 - 34.0 pg Final  . MCHC 04/06/2018 32.3  30.0 - 36.0 g/dL Final  . RDW 04/06/2018 12.6  11.5 - 15.5 % Final  . Platelets 04/06/2018 240  150 - 400 K/uL Final  . nRBC 04/06/2018 0.0  0.0 - 0.2 % Final  . Neutrophils Relative % 04/06/2018 63  % Final  . Neutro Abs 04/06/2018 2.8  1.7 - 7.7 K/uL Final  . Lymphocytes Relative 04/06/2018 22  % Final  . Lymphs Abs 04/06/2018 1.0  0.7 - 4.0 K/uL Final  . Monocytes Relative 04/06/2018 9  % Final  . Monocytes Absolute 04/06/2018 0.4  0.1 - 1.0 K/uL Final  . Eosinophils Relative 04/06/2018 4  % Final  . Eosinophils Absolute 04/06/2018 0.2  0.0 - 0.5 K/uL Final  . Basophils Relative 04/06/2018 1  % Final  . Basophils Absolute 04/06/2018 0.0  0.0 - 0.1 K/uL Final  . Immature Granulocytes 04/06/2018 1  % Final  . Abs Immature Granulocytes 04/06/2018 0.02  0.00 - 0.07 K/uL Final   Performed at [facility], [address] [place], [place] [address]    Assessment:  KEMP GOMES is a  68 y.o. male with recurrent base of tongue carcinoma.  Clinical stage wasT3 N3 M0, p16+ in 11/2014.  He received concurrent cisplatin and radiation from 02/017 - 05/21/2015.    PET scan on 09/02/2015 was equivocal.  PET scan on 12/07/2015 revealed abnormal FDG uptake (reactive from prior radiation therapy).  PET scan on 07/05/2016 revealed an enlarging level 2 lymph node (increasing SUV).  PET scan on 04/12/2017 revealed no evidence of hypermetabolic local recurrence in the oral cavity or metastatic disease within the neck, chest, abdomen or pelvis. There was no suspicious metabolic activity.  Neck and chest CT on 04/03/2018 revealed no evidence of residual or recurrent disease.    Biopsy in 07/2017 confirmed recurrence.  He was inoperable.    He is s/p 21 cycles of Keytruda (pembrolizumab) every 3 weeks (10/27/2016 - 03/09/2018).  Symptomatically, he is doing well.  He has xerostomia post radiation.  Exam reveals no palpable adenopathy.  Hemoglobin is 16.7.  Plan: 1.   Labs today:  CBC with diff, CMP, TSH. 2.   Recurrent base of tongue carcinoma  Discuss entire medical history, diagnosis and staging of base of tongue cancer.  Patient clinically doing well.  Pembrolizumab is palliative. Discuss plans for continuation of treatment unless toxicity or progression.  Discuss follow-up ENT evaluation by Dr. Kathyrn Sheriff. 3.   Hypothyroid   Patient is s/p radiation and ongoing pembrolizumab.  Monitor TSH and free T4 every other treatment.  Continue Synthroid. 4.   Xerostomia  S/p radiation.  Continue supportive care. 5.   Erythrocytosis  Hemoglobin > 16.5.  Etiology unclear.  Patient does not smoke.  Oxygen saturation 100%.  Work-up at next visit. 6.   Keytruda today. 7.   RTC in 3 weeks for MD assessment, labs (CBC with diff, CMP, Mg), and Keytruda.  Addendum:  [id number] trial.  Recommendations for pembrolizumab continuation for up to 2 years unless progression or toxicity.   Honor Loh,  NP  04/06/2018, 10:43 AM   I saw and evaluated the patient, participating in the key portions of the service and reviewing pertinent diagnostic studies and records.  I reviewed the nurse  practitioner's note and agree with the findings and the plan.  The assessment and plan were discussed with the patient.  Multiple questions were asked by the patient and answered.   Nolon Stalls, MD 04/06/2018,10:43 AM

## 2018-04-06 ENCOUNTER — Inpatient Hospital Stay: Payer: Medicare Other

## 2018-04-06 ENCOUNTER — Inpatient Hospital Stay (HOSPITAL_BASED_OUTPATIENT_CLINIC_OR_DEPARTMENT_OTHER): Payer: Medicare Other | Admitting: Hematology and Oncology

## 2018-04-06 ENCOUNTER — Encounter: Payer: Self-pay | Admitting: Hematology and Oncology

## 2018-04-06 ENCOUNTER — Inpatient Hospital Stay: Payer: Medicare Other | Attending: Hematology and Oncology

## 2018-04-06 VITALS — BP 153/87 | HR 80 | Resp 18

## 2018-04-06 VITALS — BP 164/95 | HR 91 | Temp 97.7°F | Resp 18 | Wt 174.8 lb

## 2018-04-06 DIAGNOSIS — E039 Hypothyroidism, unspecified: Secondary | ICD-10-CM | POA: Diagnosis not present

## 2018-04-06 DIAGNOSIS — G8929 Other chronic pain: Secondary | ICD-10-CM | POA: Insufficient documentation

## 2018-04-06 DIAGNOSIS — I10 Essential (primary) hypertension: Secondary | ICD-10-CM | POA: Diagnosis not present

## 2018-04-06 DIAGNOSIS — Z7189 Other specified counseling: Secondary | ICD-10-CM

## 2018-04-06 DIAGNOSIS — Z809 Family history of malignant neoplasm, unspecified: Secondary | ICD-10-CM | POA: Diagnosis not present

## 2018-04-06 DIAGNOSIS — K117 Disturbances of salivary secretion: Secondary | ICD-10-CM | POA: Insufficient documentation

## 2018-04-06 DIAGNOSIS — D751 Secondary polycythemia: Secondary | ICD-10-CM | POA: Diagnosis not present

## 2018-04-06 DIAGNOSIS — K219 Gastro-esophageal reflux disease without esophagitis: Secondary | ICD-10-CM | POA: Diagnosis not present

## 2018-04-06 DIAGNOSIS — C01 Malignant neoplasm of base of tongue: Secondary | ICD-10-CM | POA: Insufficient documentation

## 2018-04-06 DIAGNOSIS — Z5112 Encounter for antineoplastic immunotherapy: Secondary | ICD-10-CM

## 2018-04-06 DIAGNOSIS — M549 Dorsalgia, unspecified: Secondary | ICD-10-CM | POA: Insufficient documentation

## 2018-04-06 DIAGNOSIS — Z923 Personal history of irradiation: Secondary | ICD-10-CM | POA: Insufficient documentation

## 2018-04-06 DIAGNOSIS — Z79899 Other long term (current) drug therapy: Secondary | ICD-10-CM | POA: Insufficient documentation

## 2018-04-06 DIAGNOSIS — E890 Postprocedural hypothyroidism: Secondary | ICD-10-CM

## 2018-04-06 LAB — CBC WITH DIFFERENTIAL/PLATELET
Abs Immature Granulocytes: 0.02 10*3/uL (ref 0.00–0.07)
Basophils Absolute: 0 10*3/uL (ref 0.0–0.1)
Basophils Relative: 1 %
Eosinophils Absolute: 0.2 10*3/uL (ref 0.0–0.5)
Eosinophils Relative: 4 %
HCT: 51.7 % (ref 39.0–52.0)
Hemoglobin: 16.7 g/dL (ref 13.0–17.0)
Immature Granulocytes: 1 %
Lymphocytes Relative: 22 %
Lymphs Abs: 1 10*3/uL (ref 0.7–4.0)
MCH: 30.1 pg (ref 26.0–34.0)
MCHC: 32.3 g/dL (ref 30.0–36.0)
MCV: 93.3 fL (ref 80.0–100.0)
MONO ABS: 0.4 10*3/uL (ref 0.1–1.0)
Monocytes Relative: 9 %
Neutro Abs: 2.8 10*3/uL (ref 1.7–7.7)
Neutrophils Relative %: 63 %
Platelets: 240 10*3/uL (ref 150–400)
RBC: 5.54 MIL/uL (ref 4.22–5.81)
RDW: 12.6 % (ref 11.5–15.5)
WBC: 4.3 10*3/uL (ref 4.0–10.5)
nRBC: 0 % (ref 0.0–0.2)

## 2018-04-06 LAB — COMPREHENSIVE METABOLIC PANEL
ALK PHOS: 50 U/L (ref 38–126)
ALT: 15 U/L (ref 0–44)
AST: 20 U/L (ref 15–41)
Albumin: 3.8 g/dL (ref 3.5–5.0)
Anion gap: 7 (ref 5–15)
BUN: 15 mg/dL (ref 8–23)
CALCIUM: 8.6 mg/dL — AB (ref 8.9–10.3)
CO2: 28 mmol/L (ref 22–32)
CREATININE: 1.68 mg/dL — AB (ref 0.61–1.24)
Chloride: 102 mmol/L (ref 98–111)
GFR calc Af Amer: 48 mL/min — ABNORMAL LOW (ref >60)
GFR calc non Af Amer: 41 mL/min — ABNORMAL LOW (ref >60)
Glucose, Bld: 153 mg/dL — ABNORMAL HIGH (ref 70–99)
Potassium: 3.5 mmol/L (ref 3.5–5.1)
Sodium: 137 mmol/L (ref 135–145)
Total Bilirubin: 0.5 mg/dL (ref 0.3–1.2)
Total Protein: 7.5 g/dL (ref 6.5–8.1)

## 2018-04-06 MED ORDER — SODIUM CHLORIDE 0.9 % IV SOLN
200.0000 mg | Freq: Once | INTRAVENOUS | Status: AC
  Administered 2018-04-06: 200 mg via INTRAVENOUS
  Filled 2018-04-06: qty 8

## 2018-04-06 MED ORDER — SODIUM CHLORIDE 0.9 % IV SOLN
Freq: Once | INTRAVENOUS | Status: AC
  Administered 2018-04-06: 11:00:00 via INTRAVENOUS
  Filled 2018-04-06: qty 250

## 2018-04-06 NOTE — Progress Notes (Signed)
Pt here for follow up. Denies any concerns at this time.  

## 2018-04-06 NOTE — Patient Instructions (Signed)
Pembrolizumab injection  What is this medicine?  PEMBROLIZUMAB (pem broe liz ue mab) is a monoclonal antibody. It is used to treat cervical cancer, esophageal cancer, head and neck cancer, hepatocellular cancer, Hodgkin lymphoma, kidney cancer, lymphoma, melanoma, Merkel cell carcinoma, lung cancer, stomach cancer, urothelial cancer, and cancers that have a certain genetic condition.  This medicine may be used for other purposes; ask your health care provider or pharmacist if you have questions.  COMMON BRAND NAME(S): Keytruda  What should I tell my health care provider before I take this medicine?  They need to know if you have any of these conditions:  -diabetes  -immune system problems  -inflammatory bowel disease  -liver disease  -lung or breathing disease  -lupus  -received or scheduled to receive an organ transplant or a stem-cell transplant that uses donor stem cells  -an unusual or allergic reaction to pembrolizumab, other medicines, foods, dyes, or preservatives  -pregnant or trying to get pregnant  -breast-feeding  How should I use this medicine?  This medicine is for infusion into a vein. It is given by a health care professional in a hospital or clinic setting.  A special MedGuide will be given to you before each treatment. Be sure to read this information carefully each time.  Talk to your pediatrician regarding the use of this medicine in children. While this drug may be prescribed for selected conditions, precautions do apply.  Overdosage: If you think you have taken too much of this medicine contact a poison control center or emergency room at once.  NOTE: This medicine is only for you. Do not share this medicine with others.  What if I miss a dose?  It is important not to miss your dose. Call your doctor or health care professional if you are unable to keep an appointment.  What may interact with this medicine?  Interactions have not been studied.  Give your health care provider a list of all the  medicines, herbs, non-prescription drugs, or dietary supplements you use. Also tell them if you smoke, drink alcohol, or use illegal drugs. Some items may interact with your medicine.  This list may not describe all possible interactions. Give your health care provider a list of all the medicines, herbs, non-prescription drugs, or dietary supplements you use. Also tell them if you smoke, drink alcohol, or use illegal drugs. Some items may interact with your medicine.  What should I watch for while using this medicine?  Your condition will be monitored carefully while you are receiving this medicine.  You may need blood work done while you are taking this medicine.  Do not become pregnant while taking this medicine or for 4 months after stopping it. Women should inform their doctor if they wish to become pregnant or think they might be pregnant. There is a potential for serious side effects to an unborn child. Talk to your health care professional or pharmacist for more information. Do not breast-feed an infant while taking this medicine or for 4 months after the last dose.  What side effects may I notice from receiving this medicine?  Side effects that you should report to your doctor or health care professional as soon as possible:  -allergic reactions like skin rash, itching or hives, swelling of the face, lips, or tongue  -bloody or black, tarry  -breathing problems  -changes in vision  -chest pain  -chills  -confusion  -constipation  -cough  -diarrhea  -dizziness or feeling faint or lightheaded  -  fast or irregular heartbeat  -fever  -flushing  -hair loss  -joint pain  -low blood counts - this medicine may decrease the number of white blood cells, red blood cells and platelets. You may be at increased risk for infections and bleeding.  -muscle pain  -muscle weakness  -persistent headache  -redness, blistering, peeling or loosening of the skin, including inside the mouth  -signs and symptoms of high blood sugar  such as dizziness; dry mouth; dry skin; fruity breath; nausea; stomach pain; increased hunger or thirst; increased urination  -signs and symptoms of kidney injury like trouble passing urine or change in the amount of urine  -signs and symptoms of liver injury like dark urine, light-colored stools, loss of appetite, nausea, right upper belly pain, yellowing of the eyes or skin  -sweating  -swollen lymph nodes  -weight loss  Side effects that usually do not require medical attention (report to your doctor or health care professional if they continue or are bothersome):  -decreased appetite  -muscle pain  -tiredness  This list may not describe all possible side effects. Call your doctor for medical advice about side effects. You may report side effects to FDA at 1-800-FDA-1088.  Where should I keep my medicine?  This drug is given in a hospital or clinic and will not be stored at home.  NOTE: This sheet is a summary. It may not cover all possible information. If you have questions about this medicine, talk to your doctor, pharmacist, or health care provider.   2019 Elsevier/Gold Standard (2017-09-14 15:06:10)

## 2018-04-07 LAB — THYROID PANEL WITH TSH
Free Thyroxine Index: 2.9 (ref 1.2–4.9)
T3 Uptake Ratio: 32 % (ref 24–39)
T4 TOTAL: 9.1 ug/dL (ref 4.5–12.0)
TSH: 0.458 u[IU]/mL (ref 0.450–4.500)

## 2018-04-18 ENCOUNTER — Other Ambulatory Visit: Payer: Self-pay | Admitting: Hematology and Oncology

## 2018-04-27 ENCOUNTER — Inpatient Hospital Stay: Payer: Medicare Other

## 2018-04-27 ENCOUNTER — Inpatient Hospital Stay: Payer: Medicare Other | Attending: Hematology and Oncology | Admitting: Urgent Care

## 2018-04-27 ENCOUNTER — Other Ambulatory Visit: Payer: Self-pay

## 2018-04-27 ENCOUNTER — Encounter: Payer: Self-pay | Admitting: Urgent Care

## 2018-04-27 VITALS — BP 155/88 | HR 67 | Temp 97.0°F | Resp 18

## 2018-04-27 VITALS — BP 150/85 | HR 73 | Temp 97.7°F | Resp 18 | Ht 67.0 in | Wt 175.4 lb

## 2018-04-27 DIAGNOSIS — E039 Hypothyroidism, unspecified: Secondary | ICD-10-CM | POA: Diagnosis not present

## 2018-04-27 DIAGNOSIS — C01 Malignant neoplasm of base of tongue: Secondary | ICD-10-CM

## 2018-04-27 DIAGNOSIS — Z5112 Encounter for antineoplastic immunotherapy: Secondary | ICD-10-CM | POA: Diagnosis not present

## 2018-04-27 DIAGNOSIS — Z809 Family history of malignant neoplasm, unspecified: Secondary | ICD-10-CM | POA: Diagnosis not present

## 2018-04-27 DIAGNOSIS — K219 Gastro-esophageal reflux disease without esophagitis: Secondary | ICD-10-CM | POA: Diagnosis not present

## 2018-04-27 DIAGNOSIS — R682 Dry mouth, unspecified: Secondary | ICD-10-CM

## 2018-04-27 DIAGNOSIS — Z79899 Other long term (current) drug therapy: Secondary | ICD-10-CM | POA: Diagnosis not present

## 2018-04-27 DIAGNOSIS — D751 Secondary polycythemia: Secondary | ICD-10-CM | POA: Insufficient documentation

## 2018-04-27 DIAGNOSIS — N289 Disorder of kidney and ureter, unspecified: Secondary | ICD-10-CM | POA: Insufficient documentation

## 2018-04-27 DIAGNOSIS — K117 Disturbances of salivary secretion: Secondary | ICD-10-CM | POA: Diagnosis not present

## 2018-04-27 DIAGNOSIS — M549 Dorsalgia, unspecified: Secondary | ICD-10-CM | POA: Diagnosis not present

## 2018-04-27 DIAGNOSIS — I10 Essential (primary) hypertension: Secondary | ICD-10-CM | POA: Diagnosis not present

## 2018-04-27 LAB — CBC WITH DIFFERENTIAL/PLATELET
Abs Immature Granulocytes: 0.02 10*3/uL (ref 0.00–0.07)
Basophils Absolute: 0 10*3/uL (ref 0.0–0.1)
Basophils Relative: 1 %
Eosinophils Absolute: 0.2 10*3/uL (ref 0.0–0.5)
Eosinophils Relative: 5 %
HCT: 46.8 % (ref 39.0–52.0)
Hemoglobin: 15.6 g/dL (ref 13.0–17.0)
Immature Granulocytes: 0 %
Lymphocytes Relative: 20 %
Lymphs Abs: 0.9 10*3/uL (ref 0.7–4.0)
MCH: 31.1 pg (ref 26.0–34.0)
MCHC: 33.3 g/dL (ref 30.0–36.0)
MCV: 93.2 fL (ref 80.0–100.0)
Monocytes Absolute: 0.6 10*3/uL (ref 0.1–1.0)
Monocytes Relative: 14 %
Neutro Abs: 2.8 10*3/uL (ref 1.7–7.7)
Neutrophils Relative %: 60 %
Platelets: 191 10*3/uL (ref 150–400)
RBC: 5.02 MIL/uL (ref 4.22–5.81)
RDW: 13.1 % (ref 11.5–15.5)
WBC: 4.6 10*3/uL (ref 4.0–10.5)
nRBC: 0 % (ref 0.0–0.2)

## 2018-04-27 LAB — COMPREHENSIVE METABOLIC PANEL
ALT: 14 U/L (ref 0–44)
AST: 21 U/L (ref 15–41)
Albumin: 3.4 g/dL — ABNORMAL LOW (ref 3.5–5.0)
Alkaline Phosphatase: 48 U/L (ref 38–126)
Anion gap: 6 (ref 5–15)
BUN: 14 mg/dL (ref 8–23)
CO2: 24 mmol/L (ref 22–32)
Calcium: 8.3 mg/dL — ABNORMAL LOW (ref 8.9–10.3)
Chloride: 104 mmol/L (ref 98–111)
Creatinine, Ser: 1.81 mg/dL — ABNORMAL HIGH (ref 0.61–1.24)
GFR calc Af Amer: 44 mL/min — ABNORMAL LOW (ref >60)
GFR calc non Af Amer: 38 mL/min — ABNORMAL LOW (ref >60)
Glucose, Bld: 115 mg/dL — ABNORMAL HIGH (ref 70–99)
Potassium: 3.4 mmol/L — ABNORMAL LOW (ref 3.5–5.1)
Sodium: 134 mmol/L — ABNORMAL LOW (ref 135–145)
Total Bilirubin: 0.7 mg/dL (ref 0.3–1.2)
Total Protein: 6.8 g/dL (ref 6.5–8.1)

## 2018-04-27 LAB — MAGNESIUM: Magnesium: 1.9 mg/dL (ref 1.7–2.4)

## 2018-04-27 MED ORDER — SODIUM CHLORIDE 0.9 % IV SOLN
Freq: Once | INTRAVENOUS | Status: AC
  Administered 2018-04-27: 09:00:00 via INTRAVENOUS
  Filled 2018-04-27: qty 250

## 2018-04-27 MED ORDER — SODIUM CHLORIDE 0.9 % IV SOLN
200.0000 mg | Freq: Once | INTRAVENOUS | Status: AC
  Administered 2018-04-27: 200 mg via INTRAVENOUS
  Filled 2018-04-27: qty 8

## 2018-04-27 NOTE — Progress Notes (Signed)
No new changes noted today 

## 2018-04-27 NOTE — Progress Notes (Signed)
[facility] day:  04/27/2018  Chief Complaint: Troy [name] is a 68 y.o. 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  HPI:   Patient was last seen in the medical oncology clinic on 04/06/2018 for a new patient assessment following transfer of care.  At that time, patient was feeling "all right".  He denied any acute concerns.  No B symptoms or recent infections.  He complained of minor rhinorrhea and chronic back pain.  Eating well; weight stable.  Exam was unremarkable.  WBC 4300 ([place] 2800).  Hemoglobin 16.7 g/dL and hematocrit 51.7.  BUN 15 and creatinine 1.68 mg/dL.  Calcium low at 8.6 mg/dL.  Additional lab studies were ordered to be drawn at next RTC visit to further assess erythrocytosis of unknown etiology.  Patient received cycle #22 pembrolizumab on 04/06/2018.  In the interim, patient has been doing well.  He denies any acute concerns.  No nausea, vomiting, or changes to his bowel habits.  Patient denies any dysphagia.  Of note, patient was scheduled to follow-up with ENT Ladene Artist, MD), however due to issues with his transportation he did not make the appointment.  Patient denies any B symptoms or recent infections.  He continues to tolerate his immunotherapy (pembrolizumab) well with no associated side effects.  Regarding his elevated hemoglobin noted on last CBC, patient denies any known carbon monoxide exposure.  He does not have a carbon monoxide detector in his home.  He does not smoke, nor is he in close proximity to secondhand smoke.  Patient denies any known OSAH.  He does not use any exogenous sources of testosterone.  Patient denies the use of any new medications or herbal products.  Additional lab studies to be ordered today to further assess.  Patient advises that he maintains an adequate appetite. He is eating well. Weight today is 175 lb 6 oz  (79.5 kg), which compared to his last visit to the clinic, represents a 1 pound increase.  Patient denies pain in the clinic today.   Past Medical History:  Diagnosis Date  . Cancer ([place])   . GERD (gastroesophageal reflux disease)   . Hypertension   . Thyroid disease     Past Surgical History:  Procedure Laterality Date  . tumor removed      Family History  Problem Relation Age of Onset  . Cancer Mother   . Cancer Sister   . Cancer Maternal Aunt   . Cancer Paternal Uncle     Social History:  reports that he has never smoked. He has never used smokeless tobacco. He reports that he does not drink alcohol or use drugs.  He lives in [place]. The patient is accompanied by alone today.  Allergies: No Known Allergies  Current Medications: Current Outpatient Medications  Medication Sig Dispense Refill  . esomeprazole (NEXIUM) 40 MG capsule Take 1 capsule (40 mg total) by mouth daily. 30 capsule 5  . levothyroxine (SYNTHROID, LEVOTHROID) 125 MCG tablet Take 1 tablet (125 mcg total) by mouth daily. 30 tablet 5  . acetaminophen (TYLENOL) 650 MG CR tablet Take 1,300 mg by mouth every 8 (eight) hours.    . sildenafil (VIAGRA) 100 MG tablet Take 0.5-1 tablets (50-100 mg total) by mouth daily as needed for erectile dysfunction. (Patient not taking: Reported on 04/27/2018) 5 tablet 11   No current facility-administered medications for this visit.  Review of Systems  Constitutional: Negative for diaphoresis, fever, malaise/fatigue and weight loss (up 1 pound).       "I am doing all right".  HENT: Negative.  Negative for congestion, nosebleeds and sore throat.        Post-radiation xerostomia  Eyes: Negative.   Respiratory: Negative for cough, hemoptysis, sputum production and shortness of breath.   Cardiovascular: Negative for chest pain, palpitations, orthopnea, leg swelling and PND.  Gastrointestinal: Negative for abdominal pain, blood in stool, constipation, diarrhea, melena,  nausea and vomiting.  Genitourinary: Negative for dysuria, frequency, hematuria and urgency.  Musculoskeletal: Positive for back pain (chronic). Negative for falls, joint pain and myalgias.  Skin: Negative for itching and rash.  Neurological: Negative for dizziness, tremors, weakness and headaches.  Endo/Heme/Allergies: Does not bruise/bleed easily.       PMH (+) for HYPOthyroidism - on levothyroxine  Psychiatric/Behavioral: Negative for depression, memory loss and suicidal ideas. The patient is not nervous/anxious and does not have insomnia.   All other systems reviewed and are negative.  Performance status (ECOG): 0 - Asymptomatic  Vital Signs BP (!) 150/85 (BP Location: Right Arm, Patient Position: Sitting)   Pulse 73   Temp 97.7 F (36.5 C) (Tympanic)   Resp 18   Ht 5\' 7"  (1.702 m)   Wt 175 lb 6 oz (79.5 kg)   SpO2 100%   BMI 27.47 kg/m   Physical Exam  Constitutional: He is oriented to person, place, and time and well-developed, well-nourished, and in no distress.  HENT:  Head: Normocephalic and atraumatic.  Mouth/Throat: Oropharynx is clear and moist and mucous membranes are normal.  Eyes: Pupils are equal, round, and reactive to light. EOM are normal. No scleral icterus.  Neck: Normal range of motion. Neck supple. No tracheal deviation present. No thyromegaly present.  Cardiovascular: Normal rate, regular rhythm, normal heart sounds and intact distal pulses. Exam reveals no gallop and no friction rub.  No murmur heard. Pulmonary/Chest: Effort normal and breath sounds normal. No respiratory distress. He has no wheezes. He has no rales.  Abdominal: Soft. Bowel sounds are normal. He exhibits no distension. There is no abdominal tenderness.  Musculoskeletal: Normal range of motion.        General: No tenderness or edema.  Lymphadenopathy:    He has no cervical adenopathy.    He has no axillary adenopathy.       Right: No inguinal and no supraclavicular adenopathy present.        Left: No inguinal and no supraclavicular adenopathy present.  Neurological: He is alert and oriented to person, place, and time.  Skin: Skin is warm and dry. No rash noted. No erythema.  Psychiatric: Mood, affect and judgment normal.  Nursing note and vitals reviewed.   Appointment on 04/27/2018  Component Date Value Ref Range Status  . Magnesium 04/27/2018 1.9  1.7 - 2.4 mg/dL Final   Performed at [facility], [address] [place],  [address]  . Erythropoietin 04/27/2018 13.3  2.6 - 18.5 mIU/mL Final   Comment: (NOTE) [employer] Uni[employer]el DxI Artemus obtained with different assay methods or kits cannot be used interchangeably. Results cannot be interpreted as absolute evidence of the presence or absence of malignant disease. Performed At: [facility] [place], [place] [id number] Rush Farmer MD KX:[id number]   . WBC 04/27/2018 4.6  4.0 - 10.5 K/uL Final  . RBC 04/27/2018 5.02  4.22 - 5.81 MIL/uL Final  . Hemoglobin  04/27/2018 15.6  13.0 - 17.0 g/dL Final  . HCT 04/27/2018 46.8  39.0 - 52.0 % Final  . MCV 04/27/2018 93.2  80.0 - 100.0 fL Final  . MCH 04/27/2018 31.1  26.0 - 34.0 pg Final  . MCHC 04/27/2018 33.3  30.0 - 36.0 g/dL Final  . RDW 04/27/2018 13.1  11.5 - 15.5 % Final  . Platelets 04/27/2018 191  150 - 400 K/uL Final  . nRBC 04/27/2018 0.0  0.0 - 0.2 % Final  . Neutrophils Relative % 04/27/2018 60  % Final  . Neutro Abs 04/27/2018 2.8  1.7 - 7.7 K/uL Final  . Lymphocytes Relative 04/27/2018 20  % Final  . Lymphs Abs 04/27/2018 0.9  0.7 - 4.0 K/uL Final  . Monocytes Relative 04/27/2018 14  % Final  . Monocytes Absolute 04/27/2018 0.6  0.1 - 1.0 K/uL Final  . Eosinophils Relative 04/27/2018 5  % Final  . Eosinophils Absolute 04/27/2018 0.2  0.0 - 0.5 K/uL Final  . Basophils Relative 04/27/2018 1  % Final  . Basophils Absolute 04/27/2018 0.0  0.0 - 0.1 K/uL Final  . Immature Granulocytes  04/27/2018 0  % Final  . Abs Immature Granulocytes 04/27/2018 0.02  0.00 - 0.07 K/uL Final   Performed at [facility], [address] [place], [place] [address]  . Sodium 04/27/2018 134* 135 - 145 mmol/L Final  . Potassium 04/27/2018 3.4* 3.5 - 5.1 mmol/L Final  . Chloride 04/27/2018 104  98 - 111 mmol/L Final  . CO2 04/27/2018 24  22 - 32 mmol/L Final  . Glucose, Bld 04/27/2018 115* 70 - 99 mg/dL Final  . BUN 04/27/2018 14  8 - 23 mg/dL Final  . Creatinine, Ser 04/27/2018 1.81* 0.61 - 1.24 mg/dL Final  . Calcium 04/27/2018 8.3* 8.9 - 10.3 mg/dL Final  . Total Protein 04/27/2018 6.8  6.5 - 8.1 g/dL Final  . Albumin 04/27/2018 3.4* 3.5 - 5.0 g/dL Final  . AST 04/27/2018 21  15 - 41 U/L Final  . ALT 04/27/2018 14  0 - 44 U/L Final  . Alkaline Phosphatase 04/27/2018 48  38 - 126 U/L Final  . Total Bilirubin 04/27/2018 0.7  0.3 - 1.2 mg/dL Final  . GFR calc non Af Amer 04/27/2018 38* >60 mL/min Final  . GFR calc Af Amer 04/27/2018 44* >60 mL/min Final  . Anion gap 04/27/2018 6  5 - 15 Final   Performed at [facility], [address], [place], [place] [address]    Assessment:  NILSON TABORA is a 68 y.o. male with recurrent base of tongue carcinoma.  Clinical stage wasT3 N3 M0, p16+ in 11/2014.  He received concurrent cisplatin and radiation from 02/017 - 05/21/2015.    PET scan on 09/02/2015 was equivocal.  PET scan on 12/07/2015 revealed abnormal FDG uptake (reactive from prior radiation therapy).  PET scan on 07/05/2016 revealed an enlarging level 2 lymph node (increasing SUV).  PET scan on 04/12/2017 revealed no evidence of hypermetabolic local recurrence in the oral cavity or metastatic disease within the neck, chest, abdomen or pelvis. There was no suspicious metabolic activity.  Neck and chest CT on 04/03/2018 revealed no evidence of residual or recurrent disease.    Biopsy in 07/2017 confirmed recurrence.  He was inoperable.    He is s/p 22 cycles of  Keytruda (pembrolizumab) every 3 weeks (10/27/2016 - 04/06/2018).  Symptomatically, patient is doing well overall.  He denies any acute concerns.  He continues  to tolerate his immunotherapy (pembrolizumab) well with no associated side effects.  Patient denies B symptoms and recent infections.  No nausea, vomiting, or changes to his bowel habits.  Exam is grossly unremarkable.  WBC 4600 ([place] 2800).  Sodium 134 mmol/L.  Potassium 3.4 mmol/L.  BUN 14 and creatinine 1.81 mg/dL.  Calcium remains low at 8.3 mg/dL.   Plan: 1. Labs today:  CBC with diff, CMP, JAK2 e12/CALR,MPL, CO, EPO, Mg 2. Recurrent base of tongue carcinoma  Patient doing well clinically.    No symptoms.  Review need for ENT evaluation - needs to reschedule appointment with Dr. Ladene Artist.   Discuss [id number] trial.  Recommendations for pembrolizumab continuation for up to 2 years unless progression or toxicity.  Patient in agreement to continue therapy per treatment guidelines.  Labs reviewed. Blood counts stable and adequate enough for treatment. Will proceed with cycle #23 pembrolizumab.  Patient has antiemetics and pain medications at home to use on a PRN basis. 3. Hypothyroidism  TSH and free T4 on 04/06/2018 normal.  Patient remains on levothyroxine  Continue to monitor thyroid function due to ongoing pembrolizumab therapy. 4. Erythrocytosis  Hemoglobin previously elevated at 16.7 g/dL -  improved today.   Does not smoke, no exogenous testosterone, no sleep apnea.  Additional lab studies being added to further assess elevated hemoglobin: Jak2 e12/CALR/MPL, EPO, CO 5. Xerostomia  Etiology related to past radiation therapy treatments.  Patient notes that symptom improved with adequate hydration -continue.  Monitor for progression of symptoms. 6. Renal insufficiency  Labs reviewed.  BUN 14 and creatinine 1.81 mg/dL.  Estimated Creatinine Clearance: 40.1 mL/min (A) (by C-G formula based on SCr of 1.81 mg/dL  (H)).  Spoke with pharmacy regarding immunotherapy dose -no dose reduction required.  Encouraged to maintain adequate hydration. 7. RTC in 3 weeks for MD assessment, labs (CBC with diff, CMP, Mg), and cycle #24 pembrolizumab.   Honor Loh, NP  04/27/2018, 8:50 AM

## 2018-04-28 LAB — ERYTHROPOIETIN: Erythropoietin: 13.3 m[IU]/mL (ref 2.6–18.5)

## 2018-04-30 LAB — CARBON MONOXIDE, BLOOD (PERFORMED AT REF LAB): Carbon Monoxide, Blood: 3.9 % — ABNORMAL HIGH (ref 0.0–3.6)

## 2018-05-03 ENCOUNTER — Encounter (HOSPITAL_BASED_OUTPATIENT_CLINIC_OR_DEPARTMENT_OTHER): Payer: Self-pay | Admitting: Specialist

## 2018-05-08 DIAGNOSIS — Z8520 Personal history of malignant neoplasm of unspecified respiratory organ: Secondary | ICD-10-CM | POA: Diagnosis not present

## 2018-05-11 LAB — CALR + JAK2 E12-15 + MPL (REFLEXED)

## 2018-05-11 LAB — JAK2 V617F, W REFLEX TO CALR/E12/MPL

## 2018-05-18 ENCOUNTER — Inpatient Hospital Stay: Payer: Medicare Other | Attending: Hematology and Oncology | Admitting: Hematology and Oncology

## 2018-05-18 ENCOUNTER — Inpatient Hospital Stay: Payer: Medicare Other

## 2018-05-18 ENCOUNTER — Encounter: Payer: Self-pay | Admitting: Family Medicine

## 2018-05-18 ENCOUNTER — Ambulatory Visit (INDEPENDENT_AMBULATORY_CARE_PROVIDER_SITE_OTHER): Payer: Medicare Other | Admitting: Family Medicine

## 2018-05-18 ENCOUNTER — Other Ambulatory Visit: Payer: Self-pay

## 2018-05-18 ENCOUNTER — Encounter: Payer: Self-pay | Admitting: Hematology and Oncology

## 2018-05-18 ENCOUNTER — Other Ambulatory Visit: Payer: Self-pay | Admitting: Hematology and Oncology

## 2018-05-18 VITALS — BP 150/94 | HR 84 | Ht 67.0 in | Wt 175.0 lb

## 2018-05-18 VITALS — BP 179/109 | HR 85 | Temp 97.9°F | Resp 18 | Ht 67.0 in | Wt 175.5 lb

## 2018-05-18 VITALS — BP 148/90 | HR 84 | Resp 18

## 2018-05-18 DIAGNOSIS — K219 Gastro-esophageal reflux disease without esophagitis: Secondary | ICD-10-CM

## 2018-05-18 DIAGNOSIS — I10 Essential (primary) hypertension: Secondary | ICD-10-CM | POA: Diagnosis not present

## 2018-05-18 DIAGNOSIS — K117 Disturbances of salivary secretion: Secondary | ICD-10-CM

## 2018-05-18 DIAGNOSIS — C01 Malignant neoplasm of base of tongue: Secondary | ICD-10-CM

## 2018-05-18 DIAGNOSIS — D751 Secondary polycythemia: Secondary | ICD-10-CM

## 2018-05-18 DIAGNOSIS — Z5112 Encounter for antineoplastic immunotherapy: Secondary | ICD-10-CM | POA: Diagnosis not present

## 2018-05-18 DIAGNOSIS — Z803 Family history of malignant neoplasm of breast: Secondary | ICD-10-CM

## 2018-05-18 DIAGNOSIS — E890 Postprocedural hypothyroidism: Secondary | ICD-10-CM

## 2018-05-18 DIAGNOSIS — Z79899 Other long term (current) drug therapy: Secondary | ICD-10-CM | POA: Diagnosis not present

## 2018-05-18 DIAGNOSIS — E039 Hypothyroidism, unspecified: Secondary | ICD-10-CM | POA: Diagnosis not present

## 2018-05-18 DIAGNOSIS — Z923 Personal history of irradiation: Secondary | ICD-10-CM | POA: Diagnosis not present

## 2018-05-18 LAB — CBC WITH DIFFERENTIAL/PLATELET
Abs Immature Granulocytes: 0.04 10*3/uL (ref 0.00–0.07)
Basophils Absolute: 0.1 10*3/uL (ref 0.0–0.1)
Basophils Relative: 1 %
Eosinophils Absolute: 0.2 10*3/uL (ref 0.0–0.5)
Eosinophils Relative: 4 %
HCT: 50.5 % (ref 39.0–52.0)
Hemoglobin: 16.4 g/dL (ref 13.0–17.0)
Immature Granulocytes: 1 %
Lymphocytes Relative: 28 %
Lymphs Abs: 1.2 10*3/uL (ref 0.7–4.0)
MCH: 30.4 pg (ref 26.0–34.0)
MCHC: 32.5 g/dL (ref 30.0–36.0)
MCV: 93.5 fL (ref 80.0–100.0)
Monocytes Absolute: 0.6 10*3/uL (ref 0.1–1.0)
Monocytes Relative: 13 %
Neutro Abs: 2.4 10*3/uL (ref 1.7–7.7)
Neutrophils Relative %: 53 %
Platelets: 205 10*3/uL (ref 150–400)
RBC: 5.4 MIL/uL (ref 4.22–5.81)
RDW: 13 % (ref 11.5–15.5)
WBC: 4.5 10*3/uL (ref 4.0–10.5)
nRBC: 0 % (ref 0.0–0.2)

## 2018-05-18 LAB — COMPREHENSIVE METABOLIC PANEL
ALT: 13 U/L (ref 0–44)
AST: 19 U/L (ref 15–41)
Albumin: 3.7 g/dL (ref 3.5–5.0)
Alkaline Phosphatase: 43 U/L (ref 38–126)
Anion gap: 7 (ref 5–15)
BUN: 15 mg/dL (ref 8–23)
CO2: 27 mmol/L (ref 22–32)
Calcium: 8.5 mg/dL — ABNORMAL LOW (ref 8.9–10.3)
Chloride: 102 mmol/L (ref 98–111)
Creatinine, Ser: 1.68 mg/dL — ABNORMAL HIGH (ref 0.61–1.24)
GFR calc Af Amer: 48 mL/min — ABNORMAL LOW (ref >60)
GFR calc non Af Amer: 41 mL/min — ABNORMAL LOW (ref >60)
Glucose, Bld: 103 mg/dL — ABNORMAL HIGH (ref 70–99)
Potassium: 3.8 mmol/L (ref 3.5–5.1)
Sodium: 136 mmol/L (ref 135–145)
Total Bilirubin: 0.8 mg/dL (ref 0.3–1.2)
Total Protein: 6.9 g/dL (ref 6.5–8.1)

## 2018-05-18 LAB — T4, FREE: Free T4: 1.16 ng/dL (ref 0.82–1.77)

## 2018-05-18 LAB — TSH: TSH: 1.29 u[IU]/mL (ref 0.350–4.500)

## 2018-05-18 LAB — MAGNESIUM: Magnesium: 2.1 mg/dL (ref 1.7–2.4)

## 2018-05-18 MED ORDER — LISINOPRIL 5 MG PO TABS: 5.0000 mg | ORAL_TABLET | Freq: Every day | ORAL | 5 refills | Status: DC

## 2018-05-18 MED ORDER — SODIUM CHLORIDE 0.9 % IV SOLN
200.0000 mg | Freq: Once | INTRAVENOUS | Status: AC
  Administered 2018-05-18: 12:00:00 200 mg via INTRAVENOUS
  Filled 2018-05-18: qty 8

## 2018-05-18 MED ORDER — SODIUM CHLORIDE 0.9 % IV SOLN
Freq: Once | INTRAVENOUS | Status: AC
  Administered 2018-05-18: 12:00:00 via INTRAVENOUS
  Filled 2018-05-18: qty 250

## 2018-05-18 MED ORDER — LEVOTHYROXINE SODIUM 125 MCG PO TABS: 125.0000 ug | ORAL_TABLET | Freq: Every day | ORAL | 5 refills | Status: DC

## 2018-05-18 MED ORDER — ESOMEPRAZOLE MAGNESIUM 40 MG PO CPDR: 40.0000 mg | DELAYED_RELEASE_CAPSULE | Freq: Every day | ORAL | 5 refills | Status: DC

## 2018-05-18 NOTE — Progress Notes (Signed)
[facility] day:  05/18/2018  Chief Complaint: Troy [name] is a 68 y.o. 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  HPI:   Patient was last seen in the medical oncology clinic on 04/27/2018 by Honor Loh, NP.  At that time, he was doing well overall.  He denied any acute concerns.  He continued to tolerate his immunotherapy (pembrolizumab) well with no associated side effects.  Patient denied B symptoms and recent infections.  No nausea, vomiting, or changes to his bowel habits.  Exam was grossly unremarkable.  Hemoglobin was 16.7.  WBC was 4600 ([place] 2800).  Sodium was 134 mmol/L.  Potassium 3.4 mmol/L.  BUN 14 and creatinine 1.81 mg/dL.  Calcium remained low at 8.3 mg/dL.  Work-up of erythrocytosis included the following normal/negative tests:  JAK2 V617F, exon 12-15, CALR, MPL, epo level (13.3).  Carbon monoxide was 3.9% (0-3.6%).  He denies any testosterone use.  He does not smoke.  He is unsure if he has sleep apnea.  He does not feel well rested in the morning.  He received cycle #23 pembrolizumab on 04/27/2018.  During the interim, he has done well.  He recently saw Dr. Otilio Miu regarding his blood pressure.  He notes that he was taken off his blood pressure medications for awhile as his "blood pressure was low for a year".  Lisinopril was added last week secondary to recurrent hypertension.  Weight is stable.   Past Medical History:  Diagnosis Date  . Cancer ([place])   . GERD (gastroesophageal reflux disease)   . Hypertension   . Thyroid disease     Past Surgical History:  Procedure Laterality Date  . tumor removed      Family History  Problem Relation Age of Onset  . Cancer Mother   . Cancer Sister   . Cancer Maternal Aunt   . Cancer Paternal Uncle     Social History:  reports that he has never smoked. He has never used smokeless tobacco. He  reports that he does not drink alcohol or use drugs.  He lives in [place]. The patient is accompanied by alone today.  Allergies: No Known Allergies  Current Medications: Current Outpatient Medications  Medication Sig Dispense Refill  . esomeprazole (NEXIUM) 40 MG capsule Take 1 capsule (40 mg total) by mouth daily. 30 capsule 5  . levothyroxine (SYNTHROID, LEVOTHROID) 125 MCG tablet Take 1 tablet (125 mcg total) by mouth daily. 30 tablet 5  . acetaminophen (TYLENOL) 650 MG CR tablet Take 1,300 mg by mouth every 8 (eight) hours.    Marland Kitchen lisinopril (PRINIVIL,ZESTRIL) 5 MG tablet     . sildenafil (VIAGRA) 100 MG tablet Take 0.5-1 tablets (50-100 mg total) by mouth daily as needed for erectile dysfunction. (Patient not taking: Reported on 04/27/2018) 5 tablet 11   No current facility-administered medications for this visit.     Review of Systems  Constitutional: Negative for chills, diaphoresis, fever, malaise/fatigue and weight loss (stable).       Feels fine.  HENT: Negative.  Negative for congestion, ear discharge, ear pain, nosebleeds, sinus pain, sore throat and tinnitus.        Post-radiation xerostomia.  Eyes: Negative.  Negative for blurred vision, double vision, photophobia and pain.  Respiratory: Negative.  Negative for cough, hemoptysis, sputum production and shortness of breath.   Cardiovascular: Negative for chest pain, palpitations, orthopnea, leg swelling  and PND.       Recurrent hypertension.  Gastrointestinal: Negative.  Negative for abdominal pain, blood in stool, constipation, diarrhea, heartburn, melena, nausea and vomiting.  Genitourinary: Negative.  Negative for dysuria, frequency, hematuria and urgency.  Musculoskeletal: Positive for back pain (chronic). Negative for falls, joint pain and myalgias.  Skin: Negative.  Negative for itching and rash.  Neurological: Negative.  Negative for dizziness, tingling, tremors, sensory change, focal weakness, weakness and headaches.   Endo/Heme/Allergies: Does not bruise/bleed easily.       HYPOthyroidism - on levothyroxine.  Psychiatric/Behavioral: Negative.  Negative for depression and memory loss. The patient is not nervous/anxious and does not have insomnia.   All other systems reviewed and are negative.  Performance status (ECOG): 0  Vital Signs BP (!) 175/95 (BP Location: Right Arm, Patient Position: Sitting)   Pulse 85   Temp 97.9 F (36.6 C) (Tympanic)   Resp 18   Ht 5\' 7"  (1.702 m)   Wt 175 lb 7.8 oz (79.6 kg)   SpO2 99%   BMI 27.49 kg/m   Physical Exam  Constitutional: He is oriented to person, place, and time and well-developed, well-nourished, and in no distress. No distress.  HENT:  Head: Normocephalic and atraumatic.  Mouth/Throat: Oropharynx is clear and moist and mucous membranes are normal. No oropharyngeal exudate.  Wearing a black cap.  Thin black goatee.  Eyes: Pupils are equal, round, and reactive to light. Conjunctivae and EOM are normal. No scleral icterus.  Neck: Normal range of motion. Neck supple. No JVD present.  Cardiovascular: Normal rate, regular rhythm, normal heart sounds and intact distal pulses. Exam reveals no gallop and no friction rub.  No murmur heard. Pulmonary/Chest: Breath sounds normal. No respiratory distress. He has no wheezes. He has no rales.  Abdominal: Soft. Bowel sounds are normal. He exhibits no distension and no mass. There is no abdominal tenderness. There is no rebound and no guarding.  Musculoskeletal: Normal range of motion.        General: No tenderness or edema.  Lymphadenopathy:    He has no cervical adenopathy.    He has no axillary adenopathy.       Right: No supraclavicular adenopathy present.       Left: No supraclavicular adenopathy present.  Neurological: He is alert and oriented to person, place, and time. Gait normal.  Skin: Skin is warm and dry. No rash noted. He is not diaphoretic. No erythema. No pallor.  Psychiatric: Mood, affect and  judgment normal.  Nursing note and vitals reviewed.   Appointment on 05/18/2018  Component Date Value Ref Range Status  . Magnesium 05/18/2018 2.1  1.7 - 2.4 mg/dL Final   Performed at [facility], [address] [place], [place] [address]  . Sodium 05/18/2018 136  135 - 145 mmol/L Final  . Potassium 05/18/2018 3.8  3.5 - 5.1 mmol/L Final  . Chloride 05/18/2018 102  98 - 111 mmol/L Final  . CO2 05/18/2018 27  22 - 32 mmol/L Final  . Glucose, Bld 05/18/2018 103* 70 - 99 mg/dL Final  . BUN 05/18/2018 15  8 - 23 mg/dL Final  . Creatinine, Ser 05/18/2018 1.68* 0.61 - 1.24 mg/dL Final  . Calcium 05/18/2018 8.5* 8.9 - 10.3 mg/dL Final  . Total Protein 05/18/2018 6.9  6.5 - 8.1 g/dL Final  . Albumin 05/18/2018 3.7  3.5 - 5.0 g/dL Final  . AST 05/18/2018 19  15 - 41 U/L Final  . ALT 05/18/2018 13  0 - 44 U/L Final  . Alkaline Phosphatase 05/18/2018 43  38 - 126 U/L Final  . Total Bilirubin 05/18/2018 0.8  0.3 - 1.2 mg/dL Final  . GFR calc non Af Amer 05/18/2018 41* >60 mL/min Final  . GFR calc Af Amer 05/18/2018 48* >60 mL/min Final  . Anion gap 05/18/2018 7  5 - 15 Final   Performed at [facility], [address] [place], [place] [address]  . WBC 05/18/2018 4.5  4.0 - 10.5 K/uL Final  . RBC 05/18/2018 5.40  4.22 - 5.81 MIL/uL Final  . Hemoglobin 05/18/2018 16.4  13.0 - 17.0 g/dL Final  . HCT 05/18/2018 50.5  39.0 - 52.0 % Final  . MCV 05/18/2018 93.5  80.0 - 100.0 fL Final  . MCH 05/18/2018 30.4  26.0 - 34.0 pg Final  . MCHC 05/18/2018 32.5  30.0 - 36.0 g/dL Final  . RDW 05/18/2018 13.0  11.5 - 15.5 % Final  . Platelets 05/18/2018 205  150 - 400 K/uL Final  . nRBC 05/18/2018 0.0  0.0 - 0.2 % Final  . Neutrophils Relative % 05/18/2018 53  % Final  . Neutro Abs 05/18/2018 2.4  1.7 - 7.7 K/uL Final  . Lymphocytes Relative 05/18/2018 28  % Final  . Lymphs Abs 05/18/2018 1.2  0.7 - 4.0 K/uL Final  . Monocytes Relative 05/18/2018 13  % Final  . Monocytes  Absolute 05/18/2018 0.6  0.1 - 1.0 K/uL Final  . Eosinophils Relative 05/18/2018 4  % Final  . Eosinophils Absolute 05/18/2018 0.2  0.0 - 0.5 K/uL Final  . Basophils Relative 05/18/2018 1  % Final  . Basophils Absolute 05/18/2018 0.1  0.0 - 0.1 K/uL Final  . Immature Granulocytes 05/18/2018 1  % Final  . Abs Immature Granulocytes 05/18/2018 0.04  0.00 - 0.07 K/uL Final   Performed at [facility], [address] [place], [place] [address]    Assessment:  Troy [name] is a 68 y.o. male with recurrent base of tongue carcinoma.  Clinical stage wasT3 N3 M0, p16+ in 11/2014.  He received concurrent cisplatin and radiation from 02/017 - 05/21/2015.    PET scan on 09/02/2015 was equivocal.  PET scan on 12/07/2015 revealed abnormal FDG uptake (reactive from prior radiation therapy).  PET scan on 07/05/2016 revealed an enlarging level 2 lymph node (increasing SUV).  PET scan on 04/12/2017 revealed no evidence of hypermetabolic local recurrence in the oral cavity or metastatic disease within the neck, chest, abdomen or pelvis. There was no suspicious metabolic activity.  Neck and chest CT on 04/03/2018 revealed no evidence of residual or recurrent disease.    Biopsy in 07/2017 confirmed recurrence.  He was inoperable.    He is s/p 23 cycles of Keytruda (pembrolizumab) every 3 weeks (10/27/2016 - 04/27/2018).  He has erythrocytosis.  Hemoglobin was 16.7 on 04/06/2018.  Work-up on 04/27/2018 revealed the following normal/negative tests:  JAK2 V617F, exon 12-15, CALR, MPL, epo level (13.3).  Carbon monoxide was 3.9% (0-3.6%).  He denies any testosterone use.  He does not smoke.  He is unsure if he has sleep apnea.  He does not feel well rested in the morning.  Symptomatically, he is doing well.  He denies any complaints.  Blood pressure is elevated.  Exam is unremarkable.  Plan: 1. Labs today:  CBC with diff, CMP, Mg, TSH, free T4. 2. Recurrent base of tongue carcinoma Clinically doing  well. Tolerating pembrolizumab well. Discuss patient's thoughts about  continuation of pembrolizumab (during COVID-19). Discuss [id number] trial. Pembrolizumab was continued up to 2 years unless progression or toxicity. Patient would like to continue. Confirm with pharmacy start date of pembrolizumab.  [place] records reviewed.  Start date was 10/27/2016. Cycle #24 pembrolizumab. Discuss symptom management.  He has antiemetics and pain medications at home to use on a prn bases.  Interventions are adequate.    3. Hypothyroidism Patient on levothyroxine Continue to monitor regularly on pembrolizumab. 4. Erythrocytosis Hemoglobin 16.4 today. Work-up to date is negative.  Patient does not smoke and denies exogenous testosterone Patient may need sleep apnea testing.  He does not feel well rested in AM. 5. Xerostomia Etiology related to past radiation therapy treatments. Continue to monitor. 6. Hypertension Note increase in blood pressure today. No hypertension associated with pembrolizumab. Patient seen by Dr. Otilio Miu prior to treatment today. 7. RTC in 3 weeks for MD assessment, labs (CBC with diff, CMP, Mg), and cycle #25 pembrolizumab.   Lequita Asal, MD  05/18/2018, 10:04 AM

## 2018-05-18 NOTE — Progress Notes (Signed)
Date:  05/18/2018   Name:  Troy [name]   DOB:  Jul 16, 1950   MRN:  [id number]   Chief Complaint: Hypertension; Hypothyroidism; and Gastroesophageal Reflux  Hypertension  This is a chronic problem. The current episode started more than 1 year ago. The problem has been gradually worsening since onset. The problem is uncontrolled. Pertinent negatives include no anxiety, blurred vision, chest pain, headaches, malaise/fatigue, neck pain, orthopnea, palpitations, peripheral edema, PND, shortness of breath or sweats. There are no associated agents to hypertension. Risk factors for coronary artery disease include dyslipidemia. Past treatments include lifestyle changes. The current treatment provides moderate improvement. There are no compliance problems.  There is no history of angina, kidney disease, CAD/MI, CVA, heart failure, left ventricular hypertrophy, PVD or retinopathy. Identifiable causes of hypertension include a thyroid problem. There is no history of chronic renal disease, a hypertension causing med or renovascular disease.  Gastroesophageal Reflux  He reports no abdominal pain, no belching, no chest pain, no choking, no coughing, no dysphagia, no early satiety, no heartburn, no hoarse voice, no nausea, no sore throat or no wheezing. This is a new problem. The current episode started more than 1 year ago. The problem occurs occasionally. The problem has been unchanged. Nothing aggravates the symptoms. Pertinent negatives include no anemia, fatigue, melena, muscle weakness, orthopnea or weight loss. There are no known risk factors. He has tried a PPI for the symptoms. The treatment provided mild relief. Past procedures do not include an abdominal ultrasound, an EGD, esophageal manometry, esophageal pH monitoring, H. pylori antibody titer or a UGI.  Thyroid Problem  Presents for follow-up visit. Patient reports no anxiety, cold intolerance, constipation, depressed mood, diaphoresis, diarrhea,  fatigue, hair loss, heat intolerance, hoarse voice, leg swelling, nail problem, palpitations, tremors, visual change, weight gain or weight loss. Symptom course: tsh pending. There is no history of heart failure.    Review of Systems  Constitutional: Negative for chills, diaphoresis, fatigue, fever, malaise/fatigue, weight gain and weight loss.  HENT: Negative for drooling, ear discharge, ear pain, hoarse voice and sore throat.   Eyes: Negative for blurred vision.  Respiratory: Negative for cough, choking, shortness of breath and wheezing.   Cardiovascular: Negative for chest pain, palpitations, orthopnea, leg swelling and PND.  Gastrointestinal: Negative for abdominal pain, blood in stool, constipation, diarrhea, dysphagia, heartburn, melena and nausea.  Endocrine: Negative for cold intolerance, heat intolerance and polydipsia.  Genitourinary: Negative for dysuria, frequency, hematuria and urgency.  Musculoskeletal: Negative for back pain, myalgias, muscle weakness and neck pain.  Skin: Negative for rash.  Allergic/Immunologic: Negative for environmental allergies.  Neurological: Negative for dizziness, tremors and headaches.  Hematological: Does not bruise/bleed easily.  Psychiatric/Behavioral: Negative for suicidal ideas. The patient is not nervous/anxious.     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 06/22/2017   Goals of care, counseling/discussion 04/07/2017   CKD (chronic kidney disease) stage 3, GFR 30-59 ml/min ([place]) 04/03/2017   Hypothyroidism 03/31/2017   ED (erectile dysfunction) 03/31/2017   GERD (gastroesophageal reflux disease) 09/23/2016   Hypertension 09/23/2016   Squamous cell carcinoma of base of tongue ([place]) 08/04/2016    No Known Allergies  Past Surgical History:  Procedure Laterality Date   tumor removed      Social  History   Tobacco Use   Smoking status: Never Smoker   Smokeless tobacco: Never Used  Substance  Use Topics   Alcohol use: No    Frequency: Never   Drug use: No     Medication list has been reviewed and updated.  Current Meds  Medication Sig   acetaminophen (TYLENOL) 650 MG CR tablet Take 1,300 mg by mouth every 8 (eight) hours.   esomeprazole (NEXIUM) 40 MG capsule Take 1 capsule (40 mg total) by mouth daily.   levothyroxine (SYNTHROID, LEVOTHROID) 125 MCG tablet Take 1 tablet (125 mcg total) by mouth daily.    PHQ 2/9 Scores 05/18/2018 03/31/2017  PHQ - 2 Score 0 0  PHQ- 9 Score 1 -    BP Readings from Last 3 Encounters:  05/18/18 (!) 150/94  05/18/18 (!) 179/109  04/27/18 (!) 155/88    Physical Exam Vitals signs and nursing note reviewed.  HENT:     Head: Normocephalic.     Right Ear: External ear normal.     Left Ear: External ear normal.     Nose: Nose normal.  Eyes:     General: No scleral icterus.       Right eye: No discharge.        Left eye: No discharge.     Conjunctiva/sclera: Conjunctivae normal.     Pupils: Pupils are equal, round, and reactive to light.  Neck:     Musculoskeletal: Normal range of motion and neck supple. No edema.     Thyroid: No thyroid mass, thyromegaly or thyroid tenderness.     Vascular: Normal carotid pulses. No carotid bruit, hepatojugular reflux or JVD.     Trachea: No tracheal deviation.  Cardiovascular:     Rate and Rhythm: Normal rate and regular rhythm.     Chest Wall: PMI is not displaced.     Heart sounds: Normal heart sounds, S1 normal and S2 normal. No murmur. No systolic murmur. No diastolic murmur. No friction rub. No gallop. No S3 or S4 sounds.   Pulmonary:     Effort: No respiratory distress.     Breath sounds: Normal breath sounds. No decreased breath sounds, wheezing, rhonchi or rales.  Abdominal:     General: Bowel sounds are normal.     Palpations: Abdomen is soft. There is no mass.     Tenderness:  There is no abdominal tenderness. There is no guarding or rebound.  Musculoskeletal: Normal range of motion.        General: No tenderness.  Lymphadenopathy:     Cervical: No cervical adenopathy.  Skin:    General: Skin is warm.     Findings: No rash.  Neurological:     Mental Status: He is alert and oriented to person, place, and time.     Cranial Nerves: No cranial nerve deficit.     Deep Tendon Reflexes: Reflexes are normal and symmetric.     Wt Readings from Last 3 Encounters:  05/18/18 175 lb (79.4 kg)  05/18/18 175 lb 7.8 oz (79.6 kg)  04/27/18 175 lb 6 oz (79.5 kg)    BP (!) 150/94    Pulse 84    Ht 5\' 7"  (1.702 m)    Wt 175 lb (79.4 kg)    BMI 27.41 kg/m   Assessment and Plan: 1. Gastroesophageal reflux disease, esophagitis presence not specified Chronic.  Controlled.  Patient is not having any issues with reflux since initiating Nexium 40 mg once a day this will be continued at present dose. - esomeprazole (NEXIUM) 40 MG capsule; Take 1 capsule (40 mg total) by mouth daily.  Dispense:  30 capsule; Refill: 5  2. Postoperative hypothyroidism Chronic.  Uncertain as to control and TSH is pending.  Will continue Synthroid 125 mcg pending TSH reading. - levothyroxine (SYNTHROID, LEVOTHROID) 125 MCG tablet; Take 1 tablet (125 mcg total) by mouth daily.  Dispense: 30 tablet; Refill: 5  3. Essential hypertension Chronic.  Uncontrolled.  Patient used to be on lisinopril but became azotemic and decreased blood pressure during presumably chemotherapy.  Lisinopril 5 mg.  He is to return with after resumption of lisinopril 5 mg for recheck of his blood pressure in 2 weeks. - lisinopril (PRINIVIL,ZESTRIL) 5 MG tablet; Take 1 tablet (5 mg total) by mouth daily.  Dispense: 30 tablet; Refill: 5

## 2018-05-18 NOTE — Progress Notes (Signed)
No new changes noted today. The patient states he just started taking Lisinopril 5 mg daily on Monday to control his blood pressure.

## 2018-06-05 ENCOUNTER — Encounter: Payer: Self-pay | Admitting: Family Medicine

## 2018-06-05 ENCOUNTER — Ambulatory Visit (INDEPENDENT_AMBULATORY_CARE_PROVIDER_SITE_OTHER): Payer: Medicare Other | Admitting: Family Medicine

## 2018-06-05 ENCOUNTER — Other Ambulatory Visit: Payer: Self-pay

## 2018-06-05 VITALS — BP 150/102 | HR 72 | Ht 67.0 in | Wt 177.0 lb

## 2018-06-05 DIAGNOSIS — I10 Essential (primary) hypertension: Secondary | ICD-10-CM

## 2018-06-05 MED ORDER — LISINOPRIL-HYDROCHLOROTHIAZIDE 10-12.5 MG PO TABS: 1.0000 | ORAL_TABLET | Freq: Every day | ORAL | 1 refills | Status: DC

## 2018-06-05 NOTE — Progress Notes (Signed)
Date:  06/05/2018   Name:  Troy [name]   DOB:  February 06, 1951   MRN:  [id number]   Chief Complaint: Hypertension (recheck b/p)  Hypertension  This is a chronic problem. The current episode started more than 1 year ago. The problem has been waxing and waning since onset. The problem is controlled. Pertinent negatives include no anxiety, blurred vision, chest pain, headaches, malaise/fatigue, neck pain, orthopnea, palpitations, peripheral edema, PND, shortness of breath or sweats. There are no associated agents to hypertension. Past treatments include ACE inhibitors.    Review of Systems  Constitutional: Negative for chills, fever and malaise/fatigue.  HENT: Negative for drooling, ear discharge, ear pain and sore throat.   Eyes: Negative for blurred vision.  Respiratory: Negative for cough, shortness of breath and wheezing.   Cardiovascular: Negative for chest pain, palpitations, orthopnea, leg swelling and PND.  Gastrointestinal: Negative for abdominal pain, blood in stool, constipation, diarrhea and nausea.  Endocrine: Negative for polydipsia.  Genitourinary: Negative for dysuria, frequency, hematuria and urgency.  Musculoskeletal: Negative for back pain, myalgias and neck pain.  Skin: Negative for rash.  Allergic/Immunologic: Negative for environmental allergies.  Neurological: Negative for dizziness and headaches.  Hematological: Does not bruise/bleed easily.  Psychiatric/Behavioral: Negative for suicidal ideas. The patient is not nervous/anxious.     Patient Active Problem List   Diagnosis Date Noted  . Encounter for antineoplastic immunotherapy 05/18/2018  . Erythrocytosis 04/27/2018  . 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 Goals of care, counseling/discussion 04/07/2017  . CKD (chronic kidney disease) stage 3, GFR 30-59 ml/min ([facility]) 04/03/2017  . Hypothyroidism 03/31/2017  . ED (erectile dysfunction) 03/31/2017  . GERD (gastroesophageal reflux disease) 09/23/2016   . Hypertension 09/23/2016  . Squamous cell carcinoma of base of tongue ([facility]) 08/04/2016    No Known Allergies  Past Surgical History:  Procedure Laterality Date  . tumor removed      Social History   Tobacco Use  . Smoking status: Never Smoker  . Smokeless tobacco: Never Used  Substance Use Topics  . Alcohol use: No    Frequency: Never  . Drug use: No     Medication list has been reviewed and updated.  Current Meds  Medication Sig  . acetaminophen (TYLENOL) 650 MG CR tablet Take 1,300 mg by mouth every 8 (eight) hours.  Marland Kitchen esomeprazole (NEXIUM) 40 MG capsule Take 1 capsule (40 mg total) by mouth daily.  Marland Kitchen levothyroxine (SYNTHROID, LEVOTHROID) 125 MCG tablet Take 1 tablet (125 mcg total) by mouth daily.  Marland Kitchen lisinopril (PRINIVIL,ZESTRIL) 5 MG tablet Take 1 tablet (5 mg total) by mouth daily.  . sildenafil (VIAGRA) 100 MG tablet Take 0.5-1 tablets (50-100 mg total) by mouth daily as needed for erectile dysfunction.    PHQ 2/9 Scores 05/18/2018 03/31/2017  PHQ - 2 Score 0 0  PHQ- 9 Score 1 -    BP Readings from Last 3 Encounters:  06/05/18 (!) 150/102  05/18/18 (!) 150/94  05/18/18 (!) 148/90    Physical Exam Vitals signs and nursing note reviewed.  HENT:     Head: Normocephalic.     Right Ear: Tympanic membrane, ear canal and external ear normal.     Left Ear: Tympanic membrane, ear canal and external ear normal.     Nose: Nose normal. No congestion or rhinorrhea.  Eyes:     General: No scleral icterus.       Right eye: No discharge.        Left eye:  No discharge.     Conjunctiva/sclera: Conjunctivae normal.     Pupils: Pupils are equal, round, and reactive to light.  Neck:     Musculoskeletal: Normal range of motion and neck supple.     Thyroid: No thyromegaly.     Vascular: No JVD.     Trachea: No tracheal deviation.  Cardiovascular:     Rate and Rhythm: Normal rate and regular rhythm.     Heart sounds: Normal heart sounds, S1 normal and S2 normal. No  murmur. No systolic murmur. No diastolic murmur. No friction rub. No gallop. No S3 or S4 sounds.   Pulmonary:     Effort: No respiratory distress.     Breath sounds: Normal breath sounds. No wheezing or rales.  Abdominal:     General: Bowel sounds are normal.     Palpations: Abdomen is soft. There is no mass.     Tenderness: There is no abdominal tenderness. There is no guarding or rebound.  Musculoskeletal: Normal range of motion.        General: No tenderness.  Lymphadenopathy:     Cervical: No cervical adenopathy.  Skin:    General: Skin is warm.     Findings: No rash.  Neurological:     Mental Status: He is alert and oriented to person, place, and time.     Cranial Nerves: No cranial nerve deficit.     Deep Tendon Reflexes: Reflexes are normal and symmetric.     Wt Readings from Last 3 Encounters:  06/05/18 177 lb (80.3 kg)  05/18/18 175 lb (79.4 kg)  05/18/18 175 lb 7.8 oz (79.6 kg)    BP (!) 150/102   Pulse 72   Ht 5\' 7"  (1.702 m)   Wt 177 lb (80.3 kg)   BMI 27.72 kg/m   Assessment and Plan: 1. Essential hypertension Chronic.  Uncontrolled.  Patient presently on 5 mg lisinopril.  We will increase lisinopril to 10 mg and add hydrochlorothiazide 12.5 mg daily.  Will return in 6 weeks at which time we will check labs recheck blood pressure. - lisinopril-hydrochlorothiazide (ZESTORETIC) 10-12.5 MG tablet; Take 1 tablet by mouth daily.  Dispense: 30 tablet; Refill: 1

## 2018-06-08 ENCOUNTER — Other Ambulatory Visit: Payer: Self-pay

## 2018-06-08 ENCOUNTER — Other Ambulatory Visit: Payer: Self-pay | Admitting: Hematology and Oncology

## 2018-06-08 ENCOUNTER — Inpatient Hospital Stay: Payer: Medicare Other

## 2018-06-08 ENCOUNTER — Inpatient Hospital Stay (HOSPITAL_BASED_OUTPATIENT_CLINIC_OR_DEPARTMENT_OTHER): Payer: Medicare Other | Admitting: Oncology

## 2018-06-08 VITALS — BP 155/98 | HR 77 | Temp 97.8°F | Resp 18

## 2018-06-08 VITALS — BP 168/110 | HR 85 | Temp 98.7°F | Resp 16 | Wt 175.7 lb

## 2018-06-08 DIAGNOSIS — C01 Malignant neoplasm of base of tongue: Secondary | ICD-10-CM

## 2018-06-08 DIAGNOSIS — Z5112 Encounter for antineoplastic immunotherapy: Secondary | ICD-10-CM | POA: Diagnosis not present

## 2018-06-08 DIAGNOSIS — D751 Secondary polycythemia: Secondary | ICD-10-CM

## 2018-06-08 DIAGNOSIS — I10 Essential (primary) hypertension: Secondary | ICD-10-CM | POA: Diagnosis not present

## 2018-06-08 DIAGNOSIS — K219 Gastro-esophageal reflux disease without esophagitis: Secondary | ICD-10-CM | POA: Diagnosis not present

## 2018-06-08 DIAGNOSIS — E039 Hypothyroidism, unspecified: Secondary | ICD-10-CM | POA: Diagnosis not present

## 2018-06-08 LAB — CBC WITH DIFFERENTIAL/PLATELET
Abs Immature Granulocytes: 0.02 10*3/uL (ref 0.00–0.07)
Basophils Absolute: 0.1 10*3/uL (ref 0.0–0.1)
Basophils Relative: 1 %
Eosinophils Absolute: 0.2 10*3/uL (ref 0.0–0.5)
Eosinophils Relative: 3 %
HCT: 50.4 % (ref 39.0–52.0)
Hemoglobin: 16.3 g/dL (ref 13.0–17.0)
Immature Granulocytes: 0 %
Lymphocytes Relative: 28 %
Lymphs Abs: 1.5 10*3/uL (ref 0.7–4.0)
MCH: 30.1 pg (ref 26.0–34.0)
MCHC: 32.3 g/dL (ref 30.0–36.0)
MCV: 93.2 fL (ref 80.0–100.0)
Monocytes Absolute: 0.7 10*3/uL (ref 0.1–1.0)
Monocytes Relative: 13 %
Neutro Abs: 2.8 10*3/uL (ref 1.7–7.7)
Neutrophils Relative %: 55 %
Platelets: 232 10*3/uL (ref 150–400)
RBC: 5.41 MIL/uL (ref 4.22–5.81)
RDW: 13 % (ref 11.5–15.5)
WBC: 5.2 10*3/uL (ref 4.0–10.5)
nRBC: 0 % (ref 0.0–0.2)

## 2018-06-08 LAB — TSH: TSH: 0.362 u[IU]/mL (ref 0.350–4.500)

## 2018-06-08 LAB — COMPREHENSIVE METABOLIC PANEL
ALT: 14 U/L (ref 0–44)
AST: 19 U/L (ref 15–41)
Albumin: 3.6 g/dL (ref 3.5–5.0)
Alkaline Phosphatase: 47 U/L (ref 38–126)
Anion gap: 7 (ref 5–15)
BUN: 15 mg/dL (ref 8–23)
CO2: 27 mmol/L (ref 22–32)
Calcium: 8.4 mg/dL — ABNORMAL LOW (ref 8.9–10.3)
Chloride: 101 mmol/L (ref 98–111)
Creatinine, Ser: 1.84 mg/dL — ABNORMAL HIGH (ref 0.61–1.24)
GFR calc Af Amer: 43 mL/min — ABNORMAL LOW (ref >60)
GFR calc non Af Amer: 37 mL/min — ABNORMAL LOW (ref >60)
Glucose, Bld: 105 mg/dL — ABNORMAL HIGH (ref 70–99)
Potassium: 3.2 mmol/L — ABNORMAL LOW (ref 3.5–5.1)
Sodium: 135 mmol/L (ref 135–145)
Total Bilirubin: 0.7 mg/dL (ref 0.3–1.2)
Total Protein: 7.1 g/dL (ref 6.5–8.1)

## 2018-06-08 LAB — MAGNESIUM: Magnesium: 1.8 mg/dL (ref 1.7–2.4)

## 2018-06-08 MED ORDER — SODIUM CHLORIDE 0.9 % IV SOLN
200.0000 mg | Freq: Once | INTRAVENOUS | Status: AC
  Administered 2018-06-08: 12:00:00 200 mg via INTRAVENOUS
  Filled 2018-06-08: qty 8

## 2018-06-08 MED ORDER — SODIUM CHLORIDE 0.9 % IV SOLN
Freq: Once | INTRAVENOUS | Status: AC
  Administered 2018-06-08: 11:00:00 via INTRAVENOUS
  Filled 2018-06-08: qty 250

## 2018-06-08 NOTE — Progress Notes (Signed)
Pt here for follow up. Denies any concerns at this time.  

## 2018-06-08 NOTE — Progress Notes (Signed)
[facility] day:  06/08/2018  Chief Complaint: Troy [name] is a 68 y.o. 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  HPI:   Patient was last seen in the medical oncology clinic on 05/18/18. At that time, he was doing well overall.  He had recently been evaluated by Dr. Ronnald Ramp for blood pressure control and adjustment of medication.  Lisinopril was added.  His weight was stable.  Work-up of erythrocytosis included the following normal/negative tests:  JAK2 V617F, exon 12-15, CALR, MPL, epo level (13.3).  Carbon monoxide was 3.9% (0-3.6%).  He denies any testosterone use.  He does not smoke.  He is unsure if he has sleep apnea.  He does not feel well rested in the morning.  He received cycle #19 pembrolizumab on 05/18/2018.  During the interim, he has done well. He continues to tolerate immunotherapy with Keytruda without side effects.  He denies any B symptoms.  Blood work appeared to remain stable. He was evaluated by PCP Dr. Ronnald Ramp on 05/18/2018 for hypertension where lisinopril 5 mg was restarted.  Previously discontinued due to hypotension during chemotherapy.  Evaluated again on 06/04/2020 to recheck blood pressure and adjust medications as needed.  Lisinopril was increased from 5 to 10 mg and hydrochlorothiazide 12.5 mg daily was added. Today his blood pressure is still elevated.  He denies any neurological complaints including headaches or blurred vision.  He is able to check his blood pressure at home.    Past Medical History:  Diagnosis Date  . Cancer ([place])   . GERD (gastroesophageal reflux disease)   . Hypertension   . Thyroid disease     Past Surgical History:  Procedure Laterality Date  . tumor removed      Family History  Problem Relation Age of Onset  . Cancer Mother   . Cancer Sister   . Cancer Maternal Aunt   . Cancer Paternal Uncle     Social  History:  reports that he has never smoked. He has never used smokeless tobacco. He reports that he does not drink alcohol or use drugs.  He lives in [place]. The patient is accompanied by alone today.  Allergies: No Known Allergies  Current Medications: Current Outpatient Medications  Medication Sig Dispense Refill  . acetaminophen (TYLENOL) 650 MG CR tablet Take 1,300 mg by mouth every 8 (eight) hours.    Marland Kitchen esomeprazole (NEXIUM) 40 MG capsule Take 1 capsule (40 mg total) by mouth daily. 30 capsule 5  . levothyroxine (SYNTHROID, LEVOTHROID) 125 MCG tablet Take 1 tablet (125 mcg total) by mouth daily. 30 tablet 5  . lisinopril-hydrochlorothiazide (ZESTORETIC) 10-12.5 MG tablet Take 1 tablet by mouth daily. 30 tablet 1  . sildenafil (VIAGRA) 100 MG tablet Take 0.5-1 tablets (50-100 mg total) by mouth daily as needed for erectile dysfunction. 5 tablet 11   No current facility-administered medications for this visit.     Review of Systems  Constitutional: Negative.  Negative for chills, fever, malaise/fatigue and weight loss.  HENT: Negative for congestion, ear pain and tinnitus.   Eyes: Negative.  Negative for blurred vision and double vision.  Respiratory: Negative.  Negative for cough, sputum production and shortness of breath.   Cardiovascular: Negative.  Negative for chest pain, palpitations and leg swelling.  Gastrointestinal: Negative.  Negative for abdominal pain, constipation, diarrhea, nausea and vomiting.  Genitourinary: Negative for dysuria, frequency and urgency.  Musculoskeletal: Negative for back pain and falls.  Skin: Negative.  Negative for rash.  Neurological: Negative.  Negative for weakness and headaches.  Endo/Heme/Allergies: Negative.  Does not bruise/bleed easily.  Psychiatric/Behavioral: Negative.  Negative for depression. The patient is not nervous/anxious and does not have insomnia.    Performance status (ECOG): 0  Vital Signs BP (!) 168/110 (BP Location: Right  Arm, Patient Position: Sitting) Comment: manual BP  Pulse 85   Temp 98.7 F (37.1 C) (Oral)   Resp 16   Wt 175 lb 11.3 oz (79.7 kg)   SpO2 100%   BMI 27.52 kg/m   Physical Exam  Constitutional: He is oriented to person, place, and time and well-developed, well-nourished, and in no distress. Vital signs are normal.  HENT:  Head: Normocephalic and atraumatic.  Eyes: Pupils are equal, round, and reactive to light.  Neck: Normal range of motion.  Cardiovascular: Normal rate, regular rhythm and normal heart sounds.  No murmur heard. Pulmonary/Chest: Effort normal and breath sounds normal. He has no wheezes.  Abdominal: Soft. Normal appearance and bowel sounds are normal. He exhibits no distension. There is no abdominal tenderness.  Musculoskeletal: Normal range of motion.        General: No edema.  Neurological: He is alert and oriented to person, place, and time. Gait normal.  Skin: Skin is warm and dry. No rash noted.  Psychiatric: Mood, memory, affect and judgment normal.    Appointment on 06/08/2018  Component Date Value Ref Range Status  . Magnesium 06/08/2018 1.8  1.7 - 2.4 mg/dL Final   Performed at [facility], [address], [place], [place] [address]  . Sodium 06/08/2018 135  135 - 145 mmol/L Final  . Potassium 06/08/2018 3.2* 3.5 - 5.1 mmol/L Final  . Chloride 06/08/2018 101  98 - 111 mmol/L Final  . CO2 06/08/2018 27  22 - 32 mmol/L Final  . Glucose, Bld 06/08/2018 105* 70 - 99 mg/dL Final  . BUN 06/08/2018 15  8 - 23 mg/dL Final  . Creatinine, Ser 06/08/2018 1.84* 0.61 - 1.24 mg/dL Final  . Calcium 06/08/2018 8.4* 8.9 - 10.3 mg/dL Final  . Total Protein 06/08/2018 7.1  6.5 - 8.1 g/dL Final  . Albumin 06/08/2018 3.6  3.5 - 5.0 g/dL Final  . AST 06/08/2018 19  15 - 41 U/L Final  . ALT 06/08/2018 14  0 - 44 U/L Final  . Alkaline Phosphatase 06/08/2018 47  38 - 126 U/L Final  . Total Bilirubin 06/08/2018 0.7  0.3 - 1.2 mg/dL Final  . GFR calc non Af  Amer 06/08/2018 37* >60 mL/min Final  . GFR calc Af Amer 06/08/2018 43* >60 mL/min Final  . Anion gap 06/08/2018 7  5 - 15 Final   Performed at [facility], [address] [place], [place] [address]  . WBC 06/08/2018 5.2  4.0 - 10.5 K/uL Final  . RBC 06/08/2018 5.41  4.22 - 5.81 MIL/uL Final  . Hemoglobin 06/08/2018 16.3  13.0 - 17.0 g/dL Final  . HCT 06/08/2018 50.4  39.0 - 52.0 % Final  . MCV 06/08/2018 93.2  80.0 - 100.0 fL Final  . MCH 06/08/2018 30.1  26.0 - 34.0 pg Final  . MCHC 06/08/2018 32.3  30.0 - 36.0 g/dL Final  . RDW 06/08/2018 13.0  11.5 - 15.5 % Final  . Platelets 06/08/2018 232  150 - 400 K/uL Final  . nRBC 06/08/2018 0.0  0.0 - 0.2 % Final  .  Neutrophils Relative % 06/08/2018 55  % Final  . Neutro Abs 06/08/2018 2.8  1.7 - 7.7 K/uL Final  . Lymphocytes Relative 06/08/2018 28  % Final  . Lymphs Abs 06/08/2018 1.5  0.7 - 4.0 K/uL Final  . Monocytes Relative 06/08/2018 13  % Final  . Monocytes Absolute 06/08/2018 0.7  0.1 - 1.0 K/uL Final  . Eosinophils Relative 06/08/2018 3  % Final  . Eosinophils Absolute 06/08/2018 0.2  0.0 - 0.5 K/uL Final  . Basophils Relative 06/08/2018 1  % Final  . Basophils Absolute 06/08/2018 0.1  0.0 - 0.1 K/uL Final  . Immature Granulocytes 06/08/2018 0  % Final  . Abs Immature Granulocytes 06/08/2018 0.02  0.00 - 0.07 K/uL Final   Performed at [facility], [address], [place], [place] [address]    Assessment:  Troy [name] is a 68 y.o. male with recurrent base of tongue carcinoma.  Clinical stage wasT3 N3 M0, p16+ in 11/2014.  He received concurrent cisplatin and radiation from 02/017 - 05/21/2015.    PET scan on 09/02/2015 was equivocal.  PET scan on 12/07/2015 revealed abnormal FDG uptake (reactive from prior radiation therapy).  PET scan on 07/05/2016 revealed an enlarging level 2 lymph node (increasing SUV).  PET scan on 04/12/2017 revealed no evidence of hypermetabolic local recurrence in the oral  cavity or metastatic disease within the neck, chest, abdomen or pelvis. There was no suspicious metabolic activity.  Neck and chest CT on 04/03/2018 revealed no evidence of residual or recurrent disease.    Biopsy in 07/2017 confirmed recurrence.  He was inoperable.    He is s/p 23 cycles of Keytruda (pembrolizumab) every 3 weeks (10/27/2016 - 04/27/2018).  He has erythrocytosis.  Hemoglobin was 16.7 on 04/06/2018.  Work-up on 04/27/2018 revealed the following normal/negative tests:  JAK2 V617F, exon 12-15, CALR, MPL, epo level (13.3).  Carbon monoxide was 3.9% (0-3.6%).  He denies any testosterone use.  He does not smoke.  He is unsure if he has sleep apnea.  He does not feel well rested in the morning.  Symptomatically, he is doing well.  He denies any complaints.  Blood pressure is elevated.  Exam is unremarkable.  Plan: 1. Labs today:  CBC with diff, CMP, Mg, TSH, free T4. 2. Recurrent base of tongue carcinoma Clinically doing well. Tolerating pembrolizumab well. Cycle #19 pembrolizumab. Discuss symptom management.  He has antiemetics and pain medications at home to use on a prn bases.  Interventions are adequate.    3. Hypothyroidism Patient on levothyroxine Continue to monitor regularly on pembrolizumab. 4. Erythrocytosis Hemoglobin 16.3 today. Work-up to date is negative.  Patient does not smoke and denies exogenous testosterone Patient may need sleep apnea testing.  He does not feel well rested in AM. 5. Xerostomia Etiology related to past radiation therapy treatments. Continue to monitor. 6. Hypertension Note increase in blood pressure today. No hypertension associated with pembrolizumab. Patient seen by Dr. Otilio Miu prior to treatment today. 7. Hypokalemia Potassium 3.2 today.  RX potassium 40 meq daily x 5 days.  Likely d/t Keytruda (15-20% develop according to Up-to-date) and CKD.  Re-check potassium level at next visit. 8. CKD Creatinine 1.84, BUN  15 Encouraged increased Fluid intake.  9. RTC in 3 weeks for MD assessment, labs (CBC with diff, CMP, Mg), and cycle #21 pembrolizumab.   Jacquelin Hawking, NP  06/08/2018, 10:47 AM

## 2018-06-21 MED ORDER — POTASSIUM CHLORIDE CRYS ER 20 MEQ PO TBCR: 20.0000 meq | EXTENDED_RELEASE_TABLET | Freq: Two times a day (BID) | ORAL | 0 refills | Status: DC

## 2018-06-27 ENCOUNTER — Encounter: Payer: Self-pay | Admitting: Family Medicine

## 2018-06-27 ENCOUNTER — Ambulatory Visit (INDEPENDENT_AMBULATORY_CARE_PROVIDER_SITE_OTHER): Payer: Medicare Other | Admitting: Family Medicine

## 2018-06-27 ENCOUNTER — Other Ambulatory Visit: Payer: Self-pay

## 2018-06-27 VITALS — BP 138/88 | HR 88 | Ht 67.0 in | Wt 176.0 lb

## 2018-06-27 DIAGNOSIS — I700 Atherosclerosis of aorta: Secondary | ICD-10-CM

## 2018-06-27 DIAGNOSIS — I10 Essential (primary) hypertension: Secondary | ICD-10-CM

## 2018-06-27 DIAGNOSIS — N5201 Erectile dysfunction due to arterial insufficiency: Secondary | ICD-10-CM

## 2018-06-27 MED ORDER — LISINOPRIL-HYDROCHLOROTHIAZIDE 10-12.5 MG PO TABS: 1.0000 | ORAL_TABLET | Freq: Every day | ORAL | 5 refills | Status: DC

## 2018-06-27 MED ORDER — SILDENAFIL CITRATE 100 MG PO TABS: 50.0000 mg | ORAL_TABLET | Freq: Every day | ORAL | 11 refills | Status: DC | PRN

## 2018-06-27 NOTE — Progress Notes (Signed)
Date:  06/27/2018   Name:  Troy [name]   DOB:  08/27/1950   MRN:  [id number]   Chief Complaint: Hypertension (added lisinopril/ hCTZ and dropped the regular lisinopril. )  Hypertension  This is a chronic problem. The current episode started more than 1 year ago. The problem is unchanged. The problem is controlled. Pertinent negatives include no anxiety, blurred vision, chest pain, headaches, malaise/fatigue, neck pain, orthopnea, palpitations, peripheral edema, PND, shortness of breath or sweats. There are no associated agents to hypertension. There are no known risk factors for coronary artery disease. Past treatments include ACE inhibitors and diuretics. The current treatment provides moderate improvement. There are no compliance problems.  There is no history of angina, kidney disease, CAD/MI, CVA, heart failure, left ventricular hypertrophy, PVD or retinopathy. Identifiable causes of hypertension include a thyroid problem. There is no history of chronic renal disease, a hypertension causing med or renovascular disease.  Thyroid Problem  Presents for follow-up visit. Patient reports no anxiety, cold intolerance, constipation, depressed mood, diaphoresis, diarrhea, dry skin, fatigue, hair loss, heat intolerance, hoarse voice, leg swelling, nail problem, palpitations, tremors, visual change, weight gain or weight loss. The symptoms have been stable. There is no history of heart failure.  Gastroesophageal Reflux  He reports no abdominal pain, no belching, no chest pain, no choking, no coughing, no dysphagia, no early satiety, no globus sensation, no heartburn, no hoarse voice, no nausea, no sore throat, no stridor, no tooth decay, no water brash or no wheezing. This is a chronic problem. The current episode started more than 1 year ago. The problem has been unchanged. The symptoms are aggravated by certain foods. Pertinent negatives include no anemia, fatigue, muscle weakness, orthopnea or weight  loss. He has tried a PPI for the symptoms. The treatment provided moderate relief.    Review of Systems  Constitutional: Negative for chills, diaphoresis, fatigue, fever, malaise/fatigue, weight gain and weight loss.  HENT: Negative for drooling, ear discharge, ear pain, hoarse voice and sore throat.   Eyes: Negative for blurred vision.  Respiratory: Negative for cough, choking, shortness of breath and wheezing.   Cardiovascular: Negative for chest pain, palpitations, orthopnea, leg swelling and PND.  Gastrointestinal: Negative for abdominal pain, blood in stool, constipation, diarrhea, dysphagia, heartburn and nausea.  Endocrine: Negative for cold intolerance, heat intolerance and polydipsia.  Genitourinary: Negative for dysuria, frequency, hematuria and urgency.  Musculoskeletal: Negative for back pain, myalgias, muscle weakness and neck pain.  Skin: Negative for rash.  Allergic/Immunologic: Negative for environmental allergies.  Neurological: Negative for dizziness, tremors and headaches.  Hematological: Does not bruise/bleed easily.  Psychiatric/Behavioral: Negative for suicidal ideas. The patient is not nervous/anxious.     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 06/22/2017   Goals of care, counseling/discussion 04/07/2017   CKD (chronic kidney disease) stage 3, GFR 30-59 ml/min ([place]) 04/03/2017   Hypothyroidism 03/31/2017   ED (erectile dysfunction) 03/31/2017   GERD (gastroesophageal reflux disease) 09/23/2016   Hypertension 09/23/2016   Squamous cell carcinoma of base of tongue ([place]) 08/04/2016    No Known Allergies  Past Surgical History:  Procedure Laterality Date   tumor removed      Social History   Tobacco Use   Smoking status: Never Smoker   Smokeless tobacco: Never Used  Substance Use Topics   Alcohol use: No     Frequency: Never  Drug use: No     Medication list has been reviewed and updated.  Current Meds  Medication Sig   acetaminophen (TYLENOL) 650 MG CR tablet Take 1,300 mg by mouth every 8 (eight) hours.   esomeprazole (NEXIUM) 40 MG capsule Take 1 capsule (40 mg total) by mouth daily.   levothyroxine (SYNTHROID, LEVOTHROID) 125 MCG tablet Take 1 tablet (125 mcg total) by mouth daily.   lisinopril-hydrochlorothiazide (ZESTORETIC) 10-12.5 MG tablet Take 1 tablet by mouth daily.   potassium chloride SA (KLOR-CON M20) 20 MEQ tablet Take 1 tablet (20 mEq total) by mouth 2 (two) times daily.   sildenafil (VIAGRA) 100 MG tablet Take 0.5-1 tablets (50-100 mg total) by mouth daily as needed for erectile dysfunction.    PHQ 2/9 Scores 05/18/2018 03/31/2017  PHQ - 2 Score 0 0  PHQ- 9 Score 1 -    BP Readings from Last 3 Encounters:  06/27/18 138/88  06/08/18 (!) 155/98  06/08/18 (!) 168/110    Physical Exam Vitals signs and nursing note reviewed.  HENT:     Head: Normocephalic.     Right Ear: External ear normal.     Left Ear: External ear normal.     Nose: Nose normal.  Eyes:     General: No scleral icterus.       Right eye: No discharge.        Left eye: No discharge.     Conjunctiva/sclera: Conjunctivae normal.     Pupils: Pupils are equal, round, and reactive to light.  Neck:     Musculoskeletal: Normal range of motion and neck supple.     Thyroid: No thyromegaly.     Vascular: No JVD.     Trachea: No tracheal deviation.  Cardiovascular:     Rate and Rhythm: Normal rate and regular rhythm.     Heart sounds: Normal heart sounds. No murmur. No friction rub. No gallop.   Pulmonary:     Effort: No respiratory distress.     Breath sounds: Normal breath sounds. No wheezing or rales.  Abdominal:     General: Bowel sounds are normal.     Palpations: Abdomen is soft. There is no mass.     Tenderness: There is no abdominal tenderness. There is no guarding or rebound.    Musculoskeletal: Normal range of motion.        General: No tenderness.  Lymphadenopathy:     Cervical: No cervical adenopathy.  Skin:    General: Skin is warm.     Findings: No rash.  Neurological:     Mental Status: He is alert and oriented to person, place, and time.     Cranial Nerves: No cranial nerve deficit.     Deep Tendon Reflexes: Reflexes are normal and symmetric.     Wt Readings from Last 3 Encounters:  06/27/18 176 lb (79.8 kg)  06/08/18 175 lb 11.3 oz (79.7 kg)  06/05/18 177 lb (80.3 kg)    BP 138/88    Pulse 88    Ht 5\' 7"  (1.702 m)    Wt 176 lb (79.8 kg)    BMI 27.57 kg/m   Assessment and Plan: 1. Atherosclerosis of aorta ([place]) Patient was given low-cholesterol dietary sheets trolled weight loss as well as control of cholesterol patient was also instructed to maybe start a low-dose aspirin regimen at 81 mg daily.  2. Essential hypertension Controlled.  Chronic.  Patient's lisinopril was increased to 10 mg along with 12.5 mg hydrochlorothiazide on daily basis  blood pressure is within normal range and will continue on recheck in 6 months - lisinopril-hydrochlorothiazide (ZESTORETIC) 10-12.5 MG tablet; Take 1 tablet by mouth daily.  Dispense: 30 tablet; Refill: 5  3. Erectile dysfunction due to arterial insufficiency Patient has a history of erectile dysfunction for which he is on sildenafil patient will be continued on this on an as-needed basis - sildenafil (VIAGRA) 100 MG tablet; Take 0.5-1 tablets (50-100 mg total) by mouth daily as needed for erectile dysfunction.  Dispense: 5 tablet; Refill: 11

## 2018-06-27 NOTE — Patient Instructions (Signed)

## 2018-07-03 ENCOUNTER — Inpatient Hospital Stay: Payer: Medicare Other

## 2018-07-03 ENCOUNTER — Inpatient Hospital Stay: Payer: Medicare Other | Admitting: Hematology and Oncology

## 2018-07-03 ENCOUNTER — Other Ambulatory Visit: Payer: Self-pay

## 2018-07-03 NOTE — Progress Notes (Signed)
[facility]  [address] [place], [place] [address] Phone: [phone]  Fax: [phone]   Clinic Day:  07/04/2018  Referring physician: Juline Patch, MD  Chief Complaint: Troy [name] is a 68 y.o. 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  HPI: The patient was last seen in the medical oncology clinic on 06/08/2018 by Faythe Casa, NP. At that time, he is doing well. He denied any complaints. Blood pressure was elevated.  Exam was unremarkable.  He received cycle #25 pembrolizumab.   He saw his PCP, Otilio Miu, MD, on 06/27/2018. His lisinopril was increased to 10mg  in combination with hydrochlorothiazide 12.5 mg (Zestoretic) daily.   During the interim, he has been doing well. He denies any diarrhea, shortness of breath, cough, or rash. He reports dry mouth.   He is eating well and is down 2lbs in the clinic today. He is attempting to lose a little bit of weight. Blood pressure in the clinic today was 143/84.   He does not smoke. He does not know if he has sleep apnea. He denies any use of testosterone. He has increased urine frequency since increase in his blood pressure medication.    Past Medical History:  Diagnosis Date  . Cancer ([place])   . GERD (gastroesophageal reflux disease)   . Hypertension   . Thyroid disease     Past Surgical History:  Procedure Laterality Date  . tumor removed      Family History  Problem Relation Age of Onset  . Cancer Mother   . Cancer Sister   . Cancer Maternal Aunt   . Cancer Paternal Uncle     Social History:  reports that he has never smoked. He has never used smokeless tobacco. He reports that he does not drink alcohol or use drugs. He lives in [place]. He has been helping his sister with yard work. The patient is alone today.  Allergies: No Known Allergies  Current Medications: Current Outpatient  Medications  Medication Sig Dispense Refill  . acetaminophen (TYLENOL) 650 MG CR tablet Take 1,300 mg by mouth every 8 (eight) hours.    Marland Kitchen esomeprazole (NEXIUM) 40 MG capsule Take 1 capsule (40 mg total) by mouth daily. 30 capsule 5  . levothyroxine (SYNTHROID, LEVOTHROID) 125 MCG tablet Take 1 tablet (125 mcg total) by mouth daily. 30 tablet 5  . lisinopril-hydrochlorothiazide (ZESTORETIC) 10-12.5 MG tablet Take 1 tablet by mouth daily. 30 tablet 5  . potassium chloride SA (KLOR-CON M20) 20 MEQ tablet Take 1 tablet (20 mEq total) by mouth 2 (two) times daily. 10 tablet 0  . sildenafil (VIAGRA) 100 MG tablet Take 0.5-1 tablets (50-100 mg total) by mouth daily as needed for erectile dysfunction. 5 tablet 11   No current facility-administered medications for this visit.     Review of Systems  Constitutional: Positive for weight loss (3lbs). Negative for chills, diaphoresis, fever and malaise/fatigue.  HENT: Negative for congestion, ear pain, hearing loss, sinus pain and sore throat.        Dry mouth.  Eyes: Negative for blurred vision.  Respiratory: Negative for cough, shortness of breath and wheezing.   Cardiovascular: Negative for chest pain, palpitations and leg swelling.  Gastrointestinal: Negative for abdominal pain, blood in stool, constipation, diarrhea, heartburn, nausea and vomiting.  Genitourinary: Positive for frequency. Negative for dysuria and hematuria.  Musculoskeletal: Negative for back pain, falls and myalgias.  Skin: Negative for rash.  Neurological: Negative for dizziness, tingling, sensory change, weakness and headaches.  Endo/Heme/Allergies: Does not bruise/bleed easily.  Psychiatric/Behavioral: Negative for depression. The patient is not nervous/anxious and does not have insomnia.   All other systems reviewed and are negative.  Performance status (ECOG): 1  Physical Exam  Constitutional: He is oriented to person, place, and time. He appears well-developed and  well-nourished. No distress.  HENT:  Head: Normocephalic and atraumatic.  Mouth/Throat: Oropharynx is clear and moist. No oropharyngeal exudate.  Black hair.  Wearing a mask.  Eyes: Pupils are equal, round, and reactive to light. Conjunctivae and EOM are normal. No scleral icterus.  Neck: Normal range of motion. Neck supple. No JVD present.  Cardiovascular: Normal rate, regular rhythm and normal heart sounds.  No murmur heard. Pulmonary/Chest: Effort normal and breath sounds normal. No respiratory distress. He has no wheezes. He has no rales.  Abdominal: Soft. Bowel sounds are normal. He exhibits no distension and no mass. There is no abdominal tenderness. There is no rebound and no guarding.  Musculoskeletal: Normal range of motion.        General: No edema.  Lymphadenopathy:    He has no cervical adenopathy.    He has no axillary adenopathy.       Right: No supraclavicular adenopathy present.       Left: No supraclavicular adenopathy present.  Neurological: He is alert and oriented to person, place, and time.  Skin: Skin is warm and dry. No rash noted. He is not diaphoretic. No erythema.  Psychiatric: He has a normal mood and affect. His behavior is normal. Judgment and thought content normal.  Nursing note and vitals reviewed.   Appointment on 07/04/2018  Component Date Value Ref Range Status  . WBC 07/04/2018 5.7  4.0 - 10.5 K/uL Final  . RBC 07/04/2018 5.64  4.22 - 5.81 MIL/uL Final  . Hemoglobin 07/04/2018 17.5* 13.0 - 17.0 g/dL Final  . HCT 07/04/2018 52.6* 39.0 - 52.0 % Final  . MCV 07/04/2018 93.3  80.0 - 100.0 fL Final  . MCH 07/04/2018 31.0  26.0 - 34.0 pg Final  . MCHC 07/04/2018 33.3  30.0 - 36.0 g/dL Final  . RDW 07/04/2018 13.1  11.5 - 15.5 % Final  . Platelets 07/04/2018 235  150 - 400 K/uL Final  . nRBC 07/04/2018 0.0  0.0 - 0.2 % Final  . Neutrophils Relative % 07/04/2018 63  % Final  . Neutro Abs 07/04/2018 3.6  1.7 - 7.7 K/uL Final  . Lymphocytes Relative  07/04/2018 21  % Final  . Lymphs Abs 07/04/2018 1.2  0.7 - 4.0 K/uL Final  . Monocytes Relative 07/04/2018 12  % Final  . Monocytes Absolute 07/04/2018 0.7  0.1 - 1.0 K/uL Final  . Eosinophils Relative 07/04/2018 2  % Final  . Eosinophils Absolute 07/04/2018 0.1  0.0 - 0.5 K/uL Final  . Basophils Relative 07/04/2018 1  % Final  . Basophils Absolute 07/04/2018 0.1  0.0 - 0.1 K/uL Final  . Immature Granulocytes 07/04/2018 1  % Final  . Abs Immature Granulocytes 07/04/2018 0.03  0.00 - 0.07 K/uL Final   Performed at [facility], [address], [place], [place] [address]    Assessment:  Troy [name] is a 68 y.o. male with recurrent base of tongue carcinoma.  Clinical stage wasT3 N3 M0, p16+ in 11/2014. He received concurrent cisplatin and radiation from 02/017 - 05/21/2015.   PET scan on 09/02/2015 was equivocal.  PET scan on 12/07/2015 revealed abnormal FDG uptake (reactive from prior radiation therapy). PET scan on 07/05/2016 revealed an enlarging level 2 lymph node (increasing SUV).  PET scan on 04/12/2017 revealed no evidence of hypermetabolic local recurrence in the oral cavity or metastatic disease within the neck, chest, abdomen or pelvis. There was no suspicious metabolic activity.  Neck and chest CT on 04/03/2018 revealed no evidence of residual or recurrent disease.    Biopsy in 07/2017 confirmed recurrence. He was inoperable.   He is s/p 25 cycles of Keytruda (pembrolizumab) every 3 weeks (10/27/2016 - 06/08/2018).  He has erythrocytosis.  Hemoglobin was 16.7 on 04/06/2018.  Work-up on 04/27/2018 revealed the following normal/negative tests:  JAK2 V617F, exon 12-15, CALR, MPL, epo level (13.3).  Carbon monoxide was 3.9% (0-3.6%).  He denies any testosterone use.  He does not smoke.  He is unsure if he has sleep apnea.  He does not feel well rested in the morning.  Symptomatically, he is doing well.  He notes increased frequency with change of his blood  pressure medication.  Creatinine is 2.12.  Plan: 1. Labs today:  CBC with diff, CMP, Mg. 2. Recurrent base of tongue carcinoma Clinically he continues to do well. He has been tolerating every 3 week pembrolizumab well. Re-review plan for 2 years of pembrolizumab based on KEYNOTE-048 trial.   Pembrolizumab was continued up to 2 years unless progression or toxicity. Patient has received 25 cycles of pembrolizumab to date.  Potential side effects reviewed.  Patient with increased creatinine today.   Doubt nephritis.  Check urinalysis.   Hold pembrolizumab today. 3. Renal insufficiency  Baseline creatinine 1.62 - 1.81 in past year.  Creatinine today 2.12.  Patient with change in BP medication in past week.   Lisinopril 10 mg plus HCTZ 12.5 mg.   Patient notes increased urinary frequency.  Discuss maintaining good hydration. 4. Hypothyroidism Patient on levothyroxine. TSH was 0.362 on 06/08/2018. Continue to monitor regularly on pembrolizumab. 4. Erythrocytosis Hemoglobin 17.5 today. JAK2 with reflex was negative. Carbon monoxide was marginally elevated.  Re-check epo level and carbon monoxide level. Check testosterone level. Consider sleep apnea testing.  He does not feel well rested in AM. 5. Xerostomia Etiology secondary to radiation therapy. Continue supportive care. 6. Hypertension Blood pressure improved on lisinopril-HCTZ today. Continue to monitor closely with increased creatinine. 7.   RTC in 1 week for MD assessment, labs (CBC with diff, CMP, TSH, free T4), and +/- cycle #26 pembrolizumab.   Addendum:  Urinalysis reveals a benign sediment.  Doubt nephritis.  If increasing creatinine will need renal ultrasound and nephrology consultation.  I discussed the assessment and treatment plan with the patient.  The patient was provided an opportunity to ask questions and all were answered.  The patient agreed with the plan and demonstrated an understanding of the instructions.   The patient was advised to call back if the symptoms worsen or if the condition fails to improve as anticipated.   Lequita Asal, MD, PhD    07/04/2018, 1:24 PM  I, Molly Dorshimer, am acting as Education administrator for [employer]. Mike Gip, MD, PhD.  I,  C. Mike Gip, MD, have reviewed the above documentation for accuracy and completeness, and I agree with the above.

## 2018-07-04 ENCOUNTER — Inpatient Hospital Stay (HOSPITAL_BASED_OUTPATIENT_CLINIC_OR_DEPARTMENT_OTHER): Payer: Medicare Other | Admitting: Hematology and Oncology

## 2018-07-04 ENCOUNTER — Encounter: Payer: Self-pay | Admitting: Hematology and Oncology

## 2018-07-04 ENCOUNTER — Inpatient Hospital Stay: Payer: Medicare Other

## 2018-07-04 ENCOUNTER — Inpatient Hospital Stay: Payer: Medicare Other | Attending: Hematology and Oncology

## 2018-07-04 VITALS — BP 143/84 | HR 84 | Temp 97.8°F | Resp 16 | Wt 173.1 lb

## 2018-07-04 DIAGNOSIS — I129 Hypertensive chronic kidney disease with stage 1 through stage 4 chronic kidney disease, or unspecified chronic kidney disease: Secondary | ICD-10-CM | POA: Insufficient documentation

## 2018-07-04 DIAGNOSIS — Z79899 Other long term (current) drug therapy: Secondary | ICD-10-CM | POA: Insufficient documentation

## 2018-07-04 DIAGNOSIS — Z809 Family history of malignant neoplasm, unspecified: Secondary | ICD-10-CM | POA: Insufficient documentation

## 2018-07-04 DIAGNOSIS — R682 Dry mouth, unspecified: Secondary | ICD-10-CM | POA: Diagnosis not present

## 2018-07-04 DIAGNOSIS — K219 Gastro-esophageal reflux disease without esophagitis: Secondary | ICD-10-CM | POA: Insufficient documentation

## 2018-07-04 DIAGNOSIS — R350 Frequency of micturition: Secondary | ICD-10-CM | POA: Insufficient documentation

## 2018-07-04 DIAGNOSIS — C01 Malignant neoplasm of base of tongue: Secondary | ICD-10-CM | POA: Diagnosis not present

## 2018-07-04 DIAGNOSIS — D751 Secondary polycythemia: Secondary | ICD-10-CM

## 2018-07-04 DIAGNOSIS — E039 Hypothyroidism, unspecified: Secondary | ICD-10-CM | POA: Insufficient documentation

## 2018-07-04 DIAGNOSIS — N189 Chronic kidney disease, unspecified: Secondary | ICD-10-CM | POA: Insufficient documentation

## 2018-07-04 DIAGNOSIS — N289 Disorder of kidney and ureter, unspecified: Secondary | ICD-10-CM

## 2018-07-04 DIAGNOSIS — Z5112 Encounter for antineoplastic immunotherapy: Secondary | ICD-10-CM

## 2018-07-04 LAB — CBC WITH DIFFERENTIAL/PLATELET
Abs Immature Granulocytes: 0.03 10*3/uL (ref 0.00–0.07)
Basophils Absolute: 0.1 10*3/uL (ref 0.0–0.1)
Basophils Relative: 1 %
Eosinophils Absolute: 0.1 10*3/uL (ref 0.0–0.5)
Eosinophils Relative: 2 %
HCT: 52.6 % — ABNORMAL HIGH (ref 39.0–52.0)
Hemoglobin: 17.5 g/dL — ABNORMAL HIGH (ref 13.0–17.0)
Immature Granulocytes: 1 %
Lymphocytes Relative: 21 %
Lymphs Abs: 1.2 10*3/uL (ref 0.7–4.0)
MCH: 31 pg (ref 26.0–34.0)
MCHC: 33.3 g/dL (ref 30.0–36.0)
MCV: 93.3 fL (ref 80.0–100.0)
Monocytes Absolute: 0.7 10*3/uL (ref 0.1–1.0)
Monocytes Relative: 12 %
Neutro Abs: 3.6 10*3/uL (ref 1.7–7.7)
Neutrophils Relative %: 63 %
Platelets: 235 10*3/uL (ref 150–400)
RBC: 5.64 MIL/uL (ref 4.22–5.81)
RDW: 13.1 % (ref 11.5–15.5)
WBC: 5.7 10*3/uL (ref 4.0–10.5)
nRBC: 0 % (ref 0.0–0.2)

## 2018-07-04 LAB — URINALYSIS, COMPLETE (UACMP) WITH MICROSCOPIC
Bacteria, UA: NONE SEEN
Bilirubin Urine: NEGATIVE
Glucose, UA: NEGATIVE mg/dL
Hgb urine dipstick: NEGATIVE
Ketones, ur: NEGATIVE mg/dL
Leukocytes,Ua: NEGATIVE
Nitrite: NEGATIVE
Protein, ur: NEGATIVE mg/dL
RBC / HPF: NONE SEEN RBC/hpf (ref 0–5)
Specific Gravity, Urine: 1.01 (ref 1.005–1.030)
WBC, UA: NONE SEEN WBC/hpf (ref 0–5)
pH: 6 (ref 5.0–8.0)

## 2018-07-04 LAB — COMPREHENSIVE METABOLIC PANEL
ALT: 14 U/L (ref 0–44)
AST: 19 U/L (ref 15–41)
Albumin: 4 g/dL (ref 3.5–5.0)
Alkaline Phosphatase: 43 U/L (ref 38–126)
Anion gap: 9 (ref 5–15)
BUN: 17 mg/dL (ref 8–23)
CO2: 25 mmol/L (ref 22–32)
Calcium: 8.9 mg/dL (ref 8.9–10.3)
Chloride: 99 mmol/L (ref 98–111)
Creatinine, Ser: 2.12 mg/dL — ABNORMAL HIGH (ref 0.61–1.24)
GFR calc Af Amer: 36 mL/min — ABNORMAL LOW (ref >60)
GFR calc non Af Amer: 31 mL/min — ABNORMAL LOW (ref >60)
Glucose, Bld: 115 mg/dL — ABNORMAL HIGH (ref 70–99)
Potassium: 4 mmol/L (ref 3.5–5.1)
Sodium: 133 mmol/L — ABNORMAL LOW (ref 135–145)
Total Bilirubin: 0.9 mg/dL (ref 0.3–1.2)
Total Protein: 7.7 g/dL (ref 6.5–8.1)

## 2018-07-04 LAB — MAGNESIUM: Magnesium: 2.1 mg/dL (ref 1.7–2.4)

## 2018-07-04 NOTE — Progress Notes (Signed)
Pt here for follow up. Denies any concerns at this time.  

## 2018-07-05 LAB — TESTOSTERONE: Testosterone: 546 ng/dL (ref 264–916)

## 2018-07-05 LAB — ERYTHROPOIETIN: Erythropoietin: 4.7 m[IU]/mL (ref 2.6–18.5)

## 2018-07-05 LAB — CARBON MONOXIDE, BLOOD (PERFORMED AT REF LAB): Carbon Monoxide, Blood: 3 % (ref 0.0–3.6)

## 2018-07-09 DIAGNOSIS — N289 Disorder of kidney and ureter, unspecified: Secondary | ICD-10-CM | POA: Insufficient documentation

## 2018-07-09 NOTE — Progress Notes (Signed)
[facility]  [address] [place], [place] [address] Phone: [phone]  Fax: [phone]   Clinic Day:  07/10/2018  Referring physician: Juline Patch, MD  Chief Complaint: Troy [name] is a 68 y.o. 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  HPI: The patient was last seen in the medical oncology clinic on 07/04/2018. At that time, he was doing well. He noted increased frequency with change of his blood pressure medication. Creatinine was 2.12. Pembrolizumab was held due to increased creatinine. Urinalysis was benign.   During the interim, he is doing "okay." He continues to have increased frequency of urination. He is eating and drinking well. He denies any rashes or diarrhea.   He has never seen a nephrologist. His blood pressure in the clinic today is 150/88.    Past Medical History:  Diagnosis Date  . Cancer ([place])   . GERD (gastroesophageal reflux disease)   . Hypertension   . Thyroid disease     Past Surgical History:  Procedure Laterality Date  . tumor removed      Family History  Problem Relation Age of Onset  . Cancer Mother   . Cancer Sister   . Cancer Maternal Aunt   . Cancer Paternal Uncle     Social History:  reports that he has never smoked. He has never used smokeless tobacco. He reports that he does not drink alcohol or use drugs. He lives in [place]. He has been helping his sister with yard work. The patient is alone today.  Allergies: No Known Allergies  Current Medications: Current Outpatient Medications  Medication Sig Dispense Refill  . acetaminophen (TYLENOL) 650 MG CR tablet Take 1,300 mg by mouth every 8 (eight) hours.    Marland Kitchen esomeprazole (NEXIUM) 40 MG capsule Take 1 capsule (40 mg total) by mouth daily. 30 capsule 5  . levothyroxine (SYNTHROID, LEVOTHROID) 125 MCG tablet Take 1 tablet (125 mcg total) by mouth daily. 30 tablet  5  . lisinopril-hydrochlorothiazide (ZESTORETIC) 10-12.5 MG tablet Take 1 tablet by mouth daily. 30 tablet 5  . sildenafil (VIAGRA) 100 MG tablet Take 0.5-1 tablets (50-100 mg total) by mouth daily as needed for erectile dysfunction. 5 tablet 11  . potassium chloride SA (KLOR-CON M20) 20 MEQ tablet Take 1 tablet (20 mEq total) by mouth 2 (two) times daily. (Patient not taking: Reported on 07/10/2018) 10 tablet 0   No current facility-administered medications for this visit.     Review of Systems  Constitutional: Negative for chills, diaphoresis, fever, malaise/fatigue and weight loss (stable).  HENT: Negative for congestion, ear pain, hearing loss, sinus pain and sore throat.        Dry mouth.  Eyes: Negative for blurred vision.  Respiratory: Negative for cough, shortness of breath and wheezing.   Cardiovascular: Negative for chest pain, palpitations and leg swelling.  Gastrointestinal: Negative for abdominal pain, blood in stool, constipation, diarrhea, heartburn, nausea and vomiting.  Genitourinary: Positive for frequency. Negative for dysuria and hematuria.  Musculoskeletal: Negative for back pain, falls and myalgias.  Skin: Negative for rash.  Neurological: Negative for dizziness, tingling, sensory change, weakness and headaches.  Endo/Heme/Allergies: Does not bruise/bleed easily.  Psychiatric/Behavioral: Negative for depression. The patient is not nervous/anxious and does not have insomnia.   All other systems reviewed and are negative.  Performance status (ECOG): 1  Physical Exam  Constitutional: He is oriented to person, place, and time. He  appears well-developed and well-nourished. No distress.  HENT:  Head: Normocephalic and atraumatic.  Mouth/Throat: Oropharynx is clear and moist. No oropharyngeal exudate.  Black hair.  Wearing a mask.  Eyes: Pupils are equal, round, and reactive to light. Conjunctivae and EOM are normal. No scleral icterus.  Neck: Normal range of motion.  Neck supple. No JVD present.  Cardiovascular: Normal rate, regular rhythm and normal heart sounds.  No murmur heard. Pulmonary/Chest: Effort normal and breath sounds normal. No respiratory distress. He has no wheezes. He has no rales.  Abdominal: Soft. Bowel sounds are normal. He exhibits no distension and no mass. There is no abdominal tenderness. There is no rebound and no guarding.  Musculoskeletal: Normal range of motion.        General: No edema.  Lymphadenopathy:    He has no cervical adenopathy.    He has no axillary adenopathy.       Right: No supraclavicular adenopathy present.       Left: No supraclavicular adenopathy present.  Neurological: He is alert and oriented to person, place, and time.  Skin: Skin is warm and dry. No rash noted. He is not diaphoretic. No erythema.  Psychiatric: He has a normal mood and affect. His behavior is normal. Judgment and thought content normal.  Nursing note and vitals reviewed.   Appointment on 07/10/2018  Component Date Value Ref Range Status  . Sodium 07/10/2018 134* 135 - 145 mmol/L Final  . Potassium 07/10/2018 3.8  3.5 - 5.1 mmol/L Final  . Chloride 07/10/2018 101  98 - 111 mmol/L Final  . CO2 07/10/2018 26  22 - 32 mmol/L Final  . Glucose, Bld 07/10/2018 116* 70 - 99 mg/dL Final  . BUN 07/10/2018 20  8 - 23 mg/dL Final  . Creatinine, Ser 07/10/2018 1.96* 0.61 - 1.24 mg/dL Final  . Calcium 07/10/2018 8.7* 8.9 - 10.3 mg/dL Final  . Total Protein 07/10/2018 7.0  6.5 - 8.1 g/dL Final  . Albumin 07/10/2018 3.7  3.5 - 5.0 g/dL Final  . AST 07/10/2018 18  15 - 41 U/L Final  . ALT 07/10/2018 13  0 - 44 U/L Final  . Alkaline Phosphatase 07/10/2018 41  38 - 126 U/L Final  . Total Bilirubin 07/10/2018 0.9  0.3 - 1.2 mg/dL Final  . GFR calc non Af Amer 07/10/2018 34* >60 mL/min Final  . GFR calc Af Amer 07/10/2018 40* >60 mL/min Final  . Anion gap 07/10/2018 7  5 - 15 Final   Performed at [facility], [address]  [place], [place] [address]  . WBC 07/10/2018 4.5  4.0 - 10.5 K/uL Final  . RBC 07/10/2018 5.20  4.22 - 5.81 MIL/uL Final  . Hemoglobin 07/10/2018 15.8  13.0 - 17.0 g/dL Final  . HCT 07/10/2018 48.3  39.0 - 52.0 % Final  . MCV 07/10/2018 92.9  80.0 - 100.0 fL Final  . MCH 07/10/2018 30.4  26.0 - 34.0 pg Final  . MCHC 07/10/2018 32.7  30.0 - 36.0 g/dL Final  . RDW 07/10/2018 13.2  11.5 - 15.5 % Final  . Platelets 07/10/2018 195  150 - 400 K/uL Final  . nRBC 07/10/2018 0.0  0.0 - 0.2 % Final  . Neutrophils Relative % 07/10/2018 57  % Final  . Neutro Abs 07/10/2018 2.5  1.7 - 7.7 K/uL Final  . Lymphocytes Relative 07/10/2018 26  % Final  . Lymphs Abs 07/10/2018 1.2  0.7 - 4.0 K/uL Final  . Monocytes Relative  07/10/2018 13  % Final  . Monocytes Absolute 07/10/2018 0.6  0.1 - 1.0 K/uL Final  . Eosinophils Relative 07/10/2018 3  % Final  . Eosinophils Absolute 07/10/2018 0.1  0.0 - 0.5 K/uL Final  . Basophils Relative 07/10/2018 1  % Final  . Basophils Absolute 07/10/2018 0.1  0.0 - 0.1 K/uL Final  . Immature Granulocytes 07/10/2018 0  % Final  . Abs Immature Granulocytes 07/10/2018 0.02  0.00 - 0.07 K/uL Final   Performed at [facility], [address] [place], [place] [address]    Assessment:  Troy [name] is a 68 y.o. male with recurrent base of tongue carcinoma. Clinical stagewasT3 N3 M0, p16+ in 11/2014. He received concurrent cisplatin and radiationfrom 02/017 - 05/21/2015.   PET scanon 09/02/2015 was equivocal. PET scanon 12/07/2015 revealed abnormal FDG uptake (reactive from prior radiation therapy). PET scanon 07/05/2016 revealed an enlarging level 2 lymph node (increasing SUV). PET scanon 04/12/2017 revealed no evidence of hypermetabolic local recurrence in the oral cavity or metastatic disease within the neck, chest, abdomen or pelvis. There was no suspicious metabolic activity.  Neck and chest CTon 04/03/2018 revealed no evidence of residual or recurrent  disease.   Biopsyin 07/2017 confirmed recurrence. He was inoperable.   He is s/p 25cycles of Keytruda(pembrolizumab) every 3 weeks (10/27/2016 - 06/08/2018).  He has renal insufficiency.  Baseline creatinine has been 1.62 - 1.81 in past year.  Urinalysis on 07/04/2018 revealed no proteinuria or hematuria.  He has erythrocytosis. Hemoglobin was 16.7 on 04/06/2018. Work-upon 04/27/2018 revealed the followingnormal/negative tests: JAK2 V617F, exon 12-15, CALR, MPL, epo level (13.3). Carbon monoxide was 3.9% (0-3.6%) with repeat 3.0% on 07/04/2018. Testosterone was normal on 07/04/2018. He does not smoke. He is unsure if he has sleep apnea. He does not feel well rested in the morning.  Symptomatically, he denies any complaints.  Hemoglobin is 15.8.  Creatinine is 1.96.  Plan: 1. Labs today:  CBC with diff, CMP, Mg. 2. Recurrent base of tongue carcinoma Clinically he has no evidence of recurrent disease. Review plan for 2 years of pembrolizumab based on KEYNOTE-048 trial unless toxicity. He has received 25 cycles of pembrolizumab to date. Doubt increase in creatinine secondary to pembrolizumab.  Urinalysis revealed no hematuria or proteinuria. Discuss plan to hold pembrolizumab until seen by nephrology. 3. Renal insufficiency             Patient has acute on chronic renal insufficiency   Baseline creatinine 1.62 - 1.81 in past year.              Creatinine was 2.12 on 07/04/2018 and 1.96 today.             Blood pressure medications changed on 06/27/2018.                         Lisinopril 10 mg plus HCTZ 12.5 mg.             Consult nephrology, Dr Holley Raring (contacted). 4. Hypothyroidism Patienton levothyroxine. TSH was 0.362 on 06/08/2018. Monitor thyroid function tests regularly on pembrolizumab.  TSH and free T4 pending today. 5.   Erythrocytosis, improved Hemoglobin 15.8 (improved) today. JAK2 with reflex, carbon monoxide, epo level, and testosterone were normal.   May consider sleep apnea testing in the future.  Continue to monitor. 6.   Hypertension Blood pressure 150/88 on lisinopril-HCTZ today (discussed with Dr. Ronnald Ramp). Continue to monitor. 7.   Consult Dr Holley Raring [facility] order:  [phone]). 8.   Patient to call after appointment with Dr Holley Raring to schedule follow-up visit and treatment.  I discussed the assessment and treatment plan with the patient.  The patient was provided an opportunity to ask questions and all were answered.  The patient agreed with the plan and demonstrated an understanding of the instructions.  The patient was advised to call back if the symptoms worsen or if the condition fails to improve as anticipated.   Lequita Asal, MD, PhD    07/10/2018, 10:16 AM  I, Molly Dorshimer, am acting as Education administrator for [employer]. Mike Gip, MD, PhD.  I,  C. Mike Gip, MD, have reviewed the above documentation for accuracy and completeness, and I agree with the above.

## 2018-07-10 ENCOUNTER — Encounter: Payer: Self-pay | Admitting: Hematology and Oncology

## 2018-07-10 ENCOUNTER — Inpatient Hospital Stay: Payer: Medicare Other

## 2018-07-10 ENCOUNTER — Other Ambulatory Visit: Payer: Self-pay

## 2018-07-10 ENCOUNTER — Inpatient Hospital Stay (HOSPITAL_BASED_OUTPATIENT_CLINIC_OR_DEPARTMENT_OTHER): Payer: Medicare Other | Admitting: Hematology and Oncology

## 2018-07-10 VITALS — BP 150/88 | HR 84 | Temp 97.3°F | Resp 16 | Wt 173.7 lb

## 2018-07-10 DIAGNOSIS — K219 Gastro-esophageal reflux disease without esophagitis: Secondary | ICD-10-CM | POA: Diagnosis not present

## 2018-07-10 DIAGNOSIS — I129 Hypertensive chronic kidney disease with stage 1 through stage 4 chronic kidney disease, or unspecified chronic kidney disease: Secondary | ICD-10-CM

## 2018-07-10 DIAGNOSIS — N289 Disorder of kidney and ureter, unspecified: Secondary | ICD-10-CM

## 2018-07-10 DIAGNOSIS — C01 Malignant neoplasm of base of tongue: Secondary | ICD-10-CM

## 2018-07-10 DIAGNOSIS — D751 Secondary polycythemia: Secondary | ICD-10-CM | POA: Diagnosis not present

## 2018-07-10 DIAGNOSIS — R350 Frequency of micturition: Secondary | ICD-10-CM | POA: Diagnosis not present

## 2018-07-10 DIAGNOSIS — Z79899 Other long term (current) drug therapy: Secondary | ICD-10-CM | POA: Diagnosis not present

## 2018-07-10 DIAGNOSIS — R682 Dry mouth, unspecified: Secondary | ICD-10-CM

## 2018-07-10 DIAGNOSIS — E039 Hypothyroidism, unspecified: Secondary | ICD-10-CM

## 2018-07-10 DIAGNOSIS — N189 Chronic kidney disease, unspecified: Secondary | ICD-10-CM | POA: Diagnosis not present

## 2018-07-10 DIAGNOSIS — Z809 Family history of malignant neoplasm, unspecified: Secondary | ICD-10-CM | POA: Diagnosis not present

## 2018-07-10 DIAGNOSIS — Z5112 Encounter for antineoplastic immunotherapy: Secondary | ICD-10-CM

## 2018-07-10 LAB — CBC WITH DIFFERENTIAL/PLATELET
Abs Immature Granulocytes: 0.02 10*3/uL (ref 0.00–0.07)
Basophils Absolute: 0.1 10*3/uL (ref 0.0–0.1)
Basophils Relative: 1 %
Eosinophils Absolute: 0.1 10*3/uL (ref 0.0–0.5)
Eosinophils Relative: 3 %
HCT: 48.3 % (ref 39.0–52.0)
Hemoglobin: 15.8 g/dL (ref 13.0–17.0)
Immature Granulocytes: 0 %
Lymphocytes Relative: 26 %
Lymphs Abs: 1.2 10*3/uL (ref 0.7–4.0)
MCH: 30.4 pg (ref 26.0–34.0)
MCHC: 32.7 g/dL (ref 30.0–36.0)
MCV: 92.9 fL (ref 80.0–100.0)
Monocytes Absolute: 0.6 10*3/uL (ref 0.1–1.0)
Monocytes Relative: 13 %
Neutro Abs: 2.5 10*3/uL (ref 1.7–7.7)
Neutrophils Relative %: 57 %
Platelets: 195 10*3/uL (ref 150–400)
RBC: 5.2 MIL/uL (ref 4.22–5.81)
RDW: 13.2 % (ref 11.5–15.5)
WBC: 4.5 10*3/uL (ref 4.0–10.5)
nRBC: 0 % (ref 0.0–0.2)

## 2018-07-10 LAB — TSH: TSH: 0.569 u[IU]/mL (ref 0.350–4.500)

## 2018-07-10 LAB — COMPREHENSIVE METABOLIC PANEL
ALT: 13 U/L (ref 0–44)
AST: 18 U/L (ref 15–41)
Albumin: 3.7 g/dL (ref 3.5–5.0)
Alkaline Phosphatase: 41 U/L (ref 38–126)
Anion gap: 7 (ref 5–15)
BUN: 20 mg/dL (ref 8–23)
CO2: 26 mmol/L (ref 22–32)
Calcium: 8.7 mg/dL — ABNORMAL LOW (ref 8.9–10.3)
Chloride: 101 mmol/L (ref 98–111)
Creatinine, Ser: 1.96 mg/dL — ABNORMAL HIGH (ref 0.61–1.24)
GFR calc Af Amer: 40 mL/min — ABNORMAL LOW (ref >60)
GFR calc non Af Amer: 34 mL/min — ABNORMAL LOW (ref >60)
Glucose, Bld: 116 mg/dL — ABNORMAL HIGH (ref 70–99)
Potassium: 3.8 mmol/L (ref 3.5–5.1)
Sodium: 134 mmol/L — ABNORMAL LOW (ref 135–145)
Total Bilirubin: 0.9 mg/dL (ref 0.3–1.2)
Total Protein: 7 g/dL (ref 6.5–8.1)

## 2018-07-10 LAB — T4, FREE: Free T4: 1.01 ng/dL (ref 0.82–1.77)

## 2018-07-10 NOTE — Progress Notes (Signed)
Pt here for follow up. Denies any concerns at this time.  

## 2018-07-11 ENCOUNTER — Telehealth: Payer: Self-pay

## 2018-07-11 NOTE — Telephone Encounter (Signed)
Referral sent to Dr. Elwyn Lade office.

## 2018-07-17 ENCOUNTER — Telehealth: Payer: Self-pay

## 2018-07-17 ENCOUNTER — Encounter: Payer: Self-pay | Admitting: Family Medicine

## 2018-07-17 ENCOUNTER — Ambulatory Visit (INDEPENDENT_AMBULATORY_CARE_PROVIDER_SITE_OTHER): Payer: Medicare Other | Admitting: Family Medicine

## 2018-07-17 ENCOUNTER — Other Ambulatory Visit: Payer: Self-pay

## 2018-07-17 VITALS — BP 120/70 | HR 84 | Ht 67.0 in | Wt 174.0 lb

## 2018-07-17 DIAGNOSIS — R7989 Other specified abnormal findings of blood chemistry: Secondary | ICD-10-CM | POA: Diagnosis not present

## 2018-07-17 DIAGNOSIS — N183 Chronic kidney disease, stage 3 unspecified: Secondary | ICD-10-CM

## 2018-07-17 NOTE — Telephone Encounter (Signed)
Spoke w/ Anne Ng, patient's sister, to inquire if patient has completed appt. With Dr. Holley Raring. Sister states he has not heard from the office.   Contacted Dr. Elwyn Lade office and was informed referral was received, however, patient will not be scheduled until first or second week in July. Office states they will reach out to patient to schedule.   Contacted sister and informed her. Troy [name]'s number provided to her in the event she does not hear from them. Advised to contact our office when he has completed appt. So that we can schedule MD, Labs, and TX appt.

## 2018-07-17 NOTE — Progress Notes (Signed)
Date:  07/17/2018   Name:  Troy [name]   DOB:  1950/08/22   MRN:  [id number]   Chief Complaint: Follow-up (hypertension- b/p check) and creatinine elevated (needs recheck)  Hypertension  This is a chronic problem. The current episode started more than 1 year ago. The problem has been gradually improving since onset. The problem is controlled. Pertinent negatives include no anxiety, blurred vision, chest pain, headaches, malaise/fatigue, neck pain, orthopnea, palpitations, peripheral edema, PND, shortness of breath or sweats. There are no associated agents to hypertension. Risk factors for coronary artery disease include dyslipidemia and diabetes mellitus. Past treatments include ACE inhibitors and diuretics. The current treatment provides moderate improvement. There are no compliance problems.  There is no history of angina, kidney disease, CAD/MI, CVA, heart failure, left ventricular hypertrophy, PVD or retinopathy. There is no history of chronic renal disease, a hypertension causing med or renovascular disease.    Review of Systems  Constitutional: Negative for chills, fever and malaise/fatigue.  HENT: Negative for drooling, ear discharge, ear pain and sore throat.   Eyes: Negative for blurred vision.  Respiratory: Negative for cough, shortness of breath and wheezing.   Cardiovascular: Negative for chest pain, palpitations, orthopnea, leg swelling and PND.  Gastrointestinal: Negative for abdominal pain, blood in stool, constipation, diarrhea and nausea.  Endocrine: Negative for polydipsia.  Genitourinary: Negative for dysuria, frequency, hematuria and urgency.  Musculoskeletal: Negative for back pain, myalgias and neck pain.  Skin: Negative for rash.  Allergic/Immunologic: Negative for environmental allergies.  Neurological: Negative for dizziness and headaches.  Hematological: Does not bruise/bleed easily.  Psychiatric/Behavioral: Negative for suicidal ideas. The patient is not  nervous/anxious.     Patient Active Problem List   Diagnosis Date Noted  . Renal insufficiency 07/09/2018  . Atherosclerosis of aorta ([place]) 06/27/2018  . Encounter for antineoplastic immunotherapy 05/18/2018  . Erythrocytosis 04/27/2018  . 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 Goals of care, counseling/discussion 04/07/2017  . CKD (chronic kidney disease) stage 3, GFR 30-59 ml/min (HCC) 04/03/2017  . Hypothyroid 03/31/2017  . ED (erectile dysfunction) 03/31/2017  . GERD (gastroesophageal reflux disease) 09/23/2016  . Hypertension 09/23/2016  . Squamous cell carcinoma of base of tongue (HCC) 08/04/2016    No Known Allergies  Past Surgical History:  Procedure Laterality Date  . tumor removed      Social History   Tobacco Use  . Smoking status: Never Smoker  . Smokeless tobacco: Never Used  Substance Use Topics  . Alcohol use: No    Frequency: Never  . Drug use: No     Medication list has been reviewed and updated.  Current Meds  Medication Sig  . acetaminophen (TYLENOL) 650 MG CR tablet Take 1,300 mg by mouth every 8 (eight) hours.  Marland Kitchen aspirin EC 81 MG tablet Take 81 mg by mouth daily.  Marland Kitchen esomeprazole (NEXIUM) 40 MG capsule Take 1 capsule (40 mg total) by mouth daily.  Marland Kitchen levothyroxine (SYNTHROID, LEVOTHROID) 125 MCG tablet Take 1 tablet (125 mcg total) by mouth daily.  Marland Kitchen lisinopril-hydrochlorothiazide (ZESTORETIC) 10-12.5 MG tablet Take 1 tablet by mouth daily.  . sildenafil (VIAGRA) 100 MG tablet Take 0.5-1 tablets (50-100 mg total) by mouth daily as needed for erectile dysfunction.    PHQ 2/9 Scores 05/18/2018 03/31/2017  PHQ - 2 Score 0 0  PHQ- 9 Score 1 -    BP Readings from Last 3 Encounters:  07/17/18 120/70  07/10/18 (!) 150/88  07/04/18 (!) 143/84  Physical Exam Vitals signs and nursing note reviewed.  HENT:     Head: Normocephalic.     Right Ear: External ear normal.     Left Ear: External ear normal.     Nose: Nose normal.  Eyes:      General: No scleral icterus.       Right eye: No discharge.        Left eye: No discharge.     Conjunctiva/sclera: Conjunctivae normal.     Pupils: Pupils are equal, round, and reactive to light.  Neck:     Musculoskeletal: Normal range of motion and neck supple.     Thyroid: No thyromegaly.     Vascular: No JVD.     Trachea: No tracheal deviation.  Cardiovascular:     Rate and Rhythm: Normal rate and regular rhythm.     Heart sounds: Normal heart sounds. No murmur. No friction rub. No gallop.   Pulmonary:     Effort: No respiratory distress.     Breath sounds: Normal breath sounds. No wheezing or rales.  Abdominal:     General: Bowel sounds are normal.     Palpations: Abdomen is soft. There is no mass.     Tenderness: There is no abdominal tenderness. There is no guarding or rebound.  Musculoskeletal: Normal range of motion.        General: No tenderness.  Lymphadenopathy:     Cervical: No cervical adenopathy.  Skin:    General: Skin is warm.     Findings: No rash.  Neurological:     Mental Status: He is alert and oriented to person, place, and time.     Cranial Nerves: No cranial nerve deficit.     Deep Tendon Reflexes: Reflexes are normal and symmetric.     Wt Readings from Last 3 Encounters:  07/17/18 174 lb (78.9 kg)  07/10/18 173 lb 11.6 oz (78.8 kg)  07/04/18 173 lb 1 oz (78.5 kg)    BP 120/70   Pulse 84   Ht 5\' 7"  (1.702 m)   Wt 174 lb (78.9 kg)   BMI 27.25 kg/m   Assessment and Plan:   1. Elevated serum creatinine Patient has had a gradual increase of creatinine with a decrease in the GFR to the 30-31 range.  This may be due to some prerenal azotemia the question is whether or not I want to stop his hydrochlorothiazide even though his blood pressure is excellent.  Would be with only there is a continuing decrease in his creatinine patient has been trying to hydrate but is not doing as well as he was earlier.  Will check a serum creatinine to see where this  is and extrapolate his GFR. - Creatinine  2. CKD (chronic kidney disease) stage 3, GFR 30-59 ml/min (HCC) Patient with history of CKD which is worsened from the high 40s to low 30s range over the course of the last month or 2.

## 2018-07-18 LAB — CREATININE, SERUM
Creatinine, Ser: 2.1 mg/dL — ABNORMAL HIGH (ref 0.76–1.27)
GFR calc Af Amer: 37 mL/min/{1.73_m2} — ABNORMAL LOW (ref >59)
GFR calc non Af Amer: 32 mL/min/{1.73_m2} — ABNORMAL LOW (ref >59)

## 2018-07-19 ENCOUNTER — Telehealth: Payer: Self-pay

## 2018-07-19 ENCOUNTER — Ambulatory Visit: Payer: Medicare Other | Admitting: Family Medicine

## 2018-07-19 NOTE — Telephone Encounter (Signed)
Faxed new patient information to Dr Holley Raring office [phone]. Faxed conformation received. Spoke with the office they have also received the referral.

## 2018-07-23 ENCOUNTER — Telehealth: Payer: Self-pay

## 2018-07-23 NOTE — Telephone Encounter (Signed)
Spoke with Dr Holley Raring office about new patient referral that was sent over on 07/19/2018. Referral has been received but has not been schedule at this time. I was transferred over to the [facility] office and i was able to leave a message for the staff/ front office to return my call to see when the patient will be schedule Urgent request.

## 2018-07-30 DIAGNOSIS — N179 Acute kidney failure, unspecified: Secondary | ICD-10-CM | POA: Diagnosis not present

## 2018-07-30 DIAGNOSIS — I10 Essential (primary) hypertension: Secondary | ICD-10-CM | POA: Diagnosis not present

## 2018-07-30 DIAGNOSIS — N183 Chronic kidney disease, stage 3 (moderate): Secondary | ICD-10-CM | POA: Diagnosis not present

## 2018-08-01 ENCOUNTER — Other Ambulatory Visit: Payer: Self-pay | Admitting: Nephrology

## 2018-08-01 DIAGNOSIS — N183 Chronic kidney disease, stage 3 unspecified: Secondary | ICD-10-CM

## 2018-08-07 ENCOUNTER — Telehealth: Payer: Self-pay

## 2018-08-08 ENCOUNTER — Ambulatory Visit
Admission: RE | Admit: 2018-08-08 | Discharge: 2018-08-08 | Disposition: A | Discharge status: Home / Self Care | Payer: Medicare Other | Source: Ambulatory Visit | Attending: Nephrology | Admitting: Nephrology

## 2018-08-08 ENCOUNTER — Other Ambulatory Visit: Payer: Self-pay

## 2018-08-08 DIAGNOSIS — N183 Chronic kidney disease, stage 3 unspecified: Secondary | ICD-10-CM

## 2018-08-09 ENCOUNTER — Other Ambulatory Visit: Payer: Self-pay

## 2018-08-09 DIAGNOSIS — C01 Malignant neoplasm of base of tongue: Secondary | ICD-10-CM

## 2018-08-13 NOTE — Progress Notes (Signed)
[facility]  [address] [place], [place] [address] Phone: [phone]  Fax: [phone]   Clinic Day:  08/14/2018  Referring physician: Juline Patch, MD  Chief Complaint: Troy [name] is a 68 y.o. male with recurrent squamous cell carcinoma of the base of tongue who is seen for assessment prior tocycle #26pembrolizumab.  HPI: The patient was last seen in the medical oncology clinic on 07/10/2018. At that time, he denied any complaints.  Hemoglobin was 15.8.  Creatinine was 1.96.  Pembrolizumab was held secondary to increase in creatinine.  Creatinine was 2.10 on 07/17/2018.  CrCl was 37 ml/min.  He was seen in consultation by Dr Holley Raring on 07/30/2018.  He was felt to have actute renal insufficiency on stage III chronic kidney disease.  Urinalysis was negative for hematuria and proteinuria.    Hypertension was felt to possibly be playing a role.  He was hesistant to recommend discontinuation of Keytruda from a renal perspective.  A work-up was performed.  He has a follow-up in 1 month.  Renal ultrasound on 08/08/2018 revealed no acute sonographic abnormality, and no hydronephrosis.  During the interim, the patient is doing good.  He reports having dry mouth only at night. He denies any new symptoms. His weight is stable. He denies any medication changes.   Past Medical History:  Diagnosis Date   Cancer ([place])    GERD (gastroesophageal reflux disease)    Hypertension    Thyroid disease     Past Surgical History:  Procedure Laterality Date   tumor removed      Family History  Problem Relation Age of Onset   Cancer Mother    Cancer Sister    Cancer Maternal Aunt    Cancer Paternal Uncle     Social History:  reports that he has never smoked. He has never used smokeless tobacco. He reports that he does not drink alcohol or use drugs. He lives in [place].He has been helping his sister with yard work.  He lives in [place].  The  patient is alone today.  Allergies: No Known Allergies  Current Medications: Current Outpatient Medications  Medication Sig Dispense Refill   acetaminophen (TYLENOL) 650 MG CR tablet Take 1,300 mg by mouth every 8 (eight) hours.     aspirin EC 81 MG tablet Take 81 mg by mouth daily.     esomeprazole (NEXIUM) 40 MG capsule Take 1 capsule (40 mg total) by mouth daily. 30 capsule 5   levothyroxine (SYNTHROID, LEVOTHROID) 125 MCG tablet Take 1 tablet (125 mcg total) by mouth daily. 30 tablet 5   lisinopril-hydrochlorothiazide (ZESTORETIC) 10-12.5 MG tablet Take 1 tablet by mouth daily. 30 tablet 5   sildenafil (VIAGRA) 100 MG tablet Take 0.5-1 tablets (50-100 mg total) by mouth daily as needed for erectile dysfunction. (Patient not taking: Reported on 08/14/2018) 5 tablet 11   No current facility-administered medications for this visit.     Review of Systems  Constitutional: Negative.  Negative for chills, diaphoresis, fever, malaise/fatigue and weight loss (stable).       Doing good.  HENT: Negative for congestion, ear pain, hearing loss, nosebleeds, sinus pain and sore throat.        Dry mouth.  Eyes: Negative.  Negative for blurred vision, double vision and photophobia.  Respiratory: Negative.  Negative for cough, shortness of breath and wheezing.   Cardiovascular: Negative.  Negative for chest pain, palpitations and leg swelling.  Gastrointestinal: Negative.  Negative for abdominal pain,  blood in stool, constipation, diarrhea, heartburn, nausea and vomiting.  Genitourinary: Negative.  Negative for dysuria, frequency, hematuria and urgency.  Musculoskeletal: Negative.  Negative for back pain, falls, joint pain and myalgias.  Skin: Negative for rash.  Neurological: Negative.  Negative for dizziness, tingling, sensory change, weakness and headaches.  Endo/Heme/Allergies: Negative.  Does not bruise/bleed easily.  Psychiatric/Behavioral: Negative.  Negative for depression. The patient  is not nervous/anxious and does not have insomnia.   All other systems reviewed and are negative.  Performance status (ECOG): 1  Blood pressure 121/78, pulse 88, temperature 97.9 F (36.6 C), temperature source Tympanic, resp. rate 18, height 5\' 7"  (1.702 m), weight 173 lb 15.1 oz (78.9 kg), SpO2 100 %.   Physical Exam  Constitutional: He is oriented to person, place, and time. He appears well-developed and well-nourished. No distress.  HENT:  Head: Normocephalic and atraumatic.  Mouth/Throat: Oropharynx is clear and moist. No oropharyngeal exudate.  Black hair.  Wearing a mask.  Eyes: Pupils are equal, round, and reactive to light. Conjunctivae and EOM are normal. No scleral icterus.  Brown eyes.  Neck: Normal range of motion. Neck supple. No JVD present.  Cardiovascular: Normal rate, regular rhythm and normal heart sounds.  No murmur heard. Pulmonary/Chest: Effort normal and breath sounds normal. No respiratory distress. He has no wheezes. He has no rales.  Abdominal: Soft. Bowel sounds are normal. He exhibits no distension and no mass. There is no abdominal tenderness. There is no rebound and no guarding.  Musculoskeletal: Normal range of motion.        General: No edema.  Lymphadenopathy:    He has no cervical adenopathy.    He has no axillary adenopathy.       Right: No supraclavicular adenopathy present.       Left: No supraclavicular adenopathy present.  Neurological: He is alert and oriented to person, place, and time.  Skin: Skin is warm and dry. No rash noted. He is not diaphoretic. No erythema. No pallor.  Psychiatric: He has a normal mood and affect. His behavior is normal. Judgment and thought content normal.  Nursing note and vitals reviewed.   Appointment on 08/14/2018  Component Date Value Ref Range Status   Sodium 08/14/2018 133* 135 - 145 mmol/L Final   Potassium 08/14/2018 3.6  3.5 - 5.1 mmol/L Final   Chloride 08/14/2018 101  98 - 111 mmol/L Final   CO2  08/14/2018 26  22 - 32 mmol/L Final   Glucose, Bld 08/14/2018 122* 70 - 99 mg/dL Final   BUN 08/14/2018 18  8 - 23 mg/dL Final   Creatinine, Ser 08/14/2018 2.10* 0.61 - 1.24 mg/dL Final   Calcium 08/14/2018 8.4* 8.9 - 10.3 mg/dL Final   Total Protein 08/14/2018 7.0  6.5 - 8.1 g/dL Final   Albumin 08/14/2018 3.6  3.5 - 5.0 g/dL Final   AST 08/14/2018 22  15 - 41 U/L Final   ALT 08/14/2018 12  0 - 44 U/L Final   Alkaline Phosphatase 08/14/2018 39  38 - 126 U/L Final   Total Bilirubin 08/14/2018 0.6  0.3 - 1.2 mg/dL Final   GFR calc non Af Amer 08/14/2018 32* >60 mL/min Final   GFR calc Af Amer 08/14/2018 37* >60 mL/min Final   Anion gap 08/14/2018 6  5 - 15 Final   Performed at [facility], [address], [place], [place] [address]   WBC 08/14/2018 4.6  4.0 - 10.5 K/uL Final   RBC 08/14/2018  5.16  4.22 - 5.81 MIL/uL Final   Hemoglobin 08/14/2018 16.1  13.0 - 17.0 g/dL Final   HCT 08/14/2018 48.8  39.0 - 52.0 % Final   MCV 08/14/2018 94.6  80.0 - 100.0 fL Final   MCH 08/14/2018 31.2  26.0 - 34.0 pg Final   MCHC 08/14/2018 33.0  30.0 - 36.0 g/dL Final   RDW 08/14/2018 13.8  11.5 - 15.5 % Final   Platelets 08/14/2018 200  150 - 400 K/uL Final   nRBC 08/14/2018 0.0  0.0 - 0.2 % Final   Neutrophils Relative % 08/14/2018 56  % Final   Neutro Abs 08/14/2018 2.6  1.7 - 7.7 K/uL Final   Lymphocytes Relative 08/14/2018 27  % Final   Lymphs Abs 08/14/2018 1.2  0.7 - 4.0 K/uL Final   Monocytes Relative 08/14/2018 13  % Final   Monocytes Absolute 08/14/2018 0.6  0.1 - 1.0 K/uL Final   Eosinophils Relative 08/14/2018 3  % Final   Eosinophils Absolute 08/14/2018 0.1  0.0 - 0.5 K/uL Final   Basophils Relative 08/14/2018 1  % Final   Basophils Absolute 08/14/2018 0.1  0.0 - 0.1 K/uL Final   Immature Granulocytes 08/14/2018 0  % Final   Abs Immature Granulocytes 08/14/2018 0.02  0.00 - 0.07 K/uL Final   Performed at [facility],  [address], [place], [place] [address]   Magnesium 08/14/2018 2.2  1.7 - 2.4 mg/dL Final   Performed at [facility], [address], [place], [place] [address]    Assessment:  GERALD KUEHL is a 68 y.o. male with recurrent base of tongue carcinoma. Clinical stagewasT3 N3 M0, p16+ in 11/2014. He received concurrent cisplatin and radiationfrom 02/017 - 05/21/2015.   PET scanon 09/02/2015 was equivocal. PET scanon 12/07/2015 revealed abnormal FDG uptake (reactive from prior radiation therapy). PET scanon 07/05/2016 revealed an enlarging level 2 lymph node (increasing SUV). PET scanon 04/12/2017 revealed no evidence of hypermetabolic local recurrence in the oral cavity or metastatic disease within the neck, chest, abdomen or pelvis. There was no suspicious metabolic activity.  Neck and chest CTon 04/03/2018 revealed no evidence of residual or recurrent disease.   Biopsyin 07/2017 confirmed recurrence. He was inoperable.   He is s/p 25cycles of Keytruda(pembrolizumab) every 3 weeks (10/27/2016 - 06/08/2018).  He has renal insufficiency.  Baseline creatinine has been 1.62 - 1.81 in past year.  Urinalysis on 07/04/2018 revealed no proteinuria or hematuria.  He has erythrocytosis. Hemoglobin was 16.7 on 04/06/2018. Work-upon 04/27/2018 revealed the followingnormal/negative tests: JAK2 V617F, exon 12-15, CALR, MPL, epo level (13.3). Carbon monoxide was 3.9% (0-3.6%) with repeat 3.0% on 07/04/2018. Testosterone was normal on 07/04/2018. He does not smoke. He is unsure if he has sleep apnea. He does not feel well rested in the morning.  Symptomatically, he denies any complaints.  Exam is unremarkable.  Creatinine is 2.0.  Plan: 1.   Labs today:  CBC with diff, CMP, Mg. 2.   Recurrent base of tongue carcinoma Clinically he is doing well.   Exam reveals no evidence of recurrent disease.   Re-review the overall plan for 2 years of pembrolizumab based on  the KEYNOTE-048 trial unless any toxicity. He has received 25 cycles of pembrolizumab todate.  Suspect increased creatinine not secondary to pembrolizumab.    Await work-up by Dr. Holley Raring. Hold pembrolizumab today. 3.   Renal insufficiency Patient has acute on chronic renal insufficiency.  Nephrology work-up underway.  Baseline creatinine 1.62 - 1.81 in past year.             Creatinine was 2.12 on 07/04/2018 and 2.10 today. 4.   Hypothyroidism Patienton levothyroxine. TSH was 0.569 on 07/10/2018. Continue to monitor thyroid function test with each cycle of pembrolizumab. 5.   Erythrocytosis, improved Hemoglobin 16.1 today. JAK2 with reflex, carbon monoxide, epo level, and testosterone were normal.  Consider sleep apnea testing in the future if hemoglobin > 16.5.  Continue to monitor. 6.   RTC in 3 weeks for MD assessment, labs (CBC with diff, CMP, TSH), and pembrolizumab.  I discussed the assessment and treatment plan with the patient.  The patient was provided an opportunity to ask questions and all were answered.  The patient agreed with the plan and demonstrated an understanding of the instructions.  The patient was advised to call back if the symptoms worsen or if the condition fails to improve as anticipated.  I provided 10 minutes of face-to-face time during this this encounter and > 50% was spent counseling as documented under my assessment and plan.    Lequita Asal, MD, PhD    08/14/2018, 10:45 AM  I, Selena Batten, am acting as scribe for [employer]. Mike Gip, MD, PhD.  I, Trenton Ver[place] C. Mike Gip, MD, have reviewed the above documentation for accuracy and completeness, and I agree with the above.

## 2018-08-14 ENCOUNTER — Inpatient Hospital Stay: Payer: Medicare Other

## 2018-08-14 ENCOUNTER — Other Ambulatory Visit: Payer: Self-pay

## 2018-08-14 ENCOUNTER — Inpatient Hospital Stay: Payer: Medicare Other | Attending: Hematology and Oncology | Admitting: Hematology and Oncology

## 2018-08-14 ENCOUNTER — Encounter: Payer: Self-pay | Admitting: Hematology and Oncology

## 2018-08-14 VITALS — BP 121/78 | HR 88 | Temp 97.9°F | Resp 18 | Ht 67.0 in | Wt 173.9 lb

## 2018-08-14 DIAGNOSIS — N183 Chronic kidney disease, stage 3 (moderate): Secondary | ICD-10-CM | POA: Diagnosis not present

## 2018-08-14 DIAGNOSIS — Z9221 Personal history of antineoplastic chemotherapy: Secondary | ICD-10-CM | POA: Diagnosis not present

## 2018-08-14 DIAGNOSIS — I129 Hypertensive chronic kidney disease with stage 1 through stage 4 chronic kidney disease, or unspecified chronic kidney disease: Secondary | ICD-10-CM | POA: Insufficient documentation

## 2018-08-14 DIAGNOSIS — Z5112 Encounter for antineoplastic immunotherapy: Secondary | ICD-10-CM

## 2018-08-14 DIAGNOSIS — Z923 Personal history of irradiation: Secondary | ICD-10-CM | POA: Insufficient documentation

## 2018-08-14 DIAGNOSIS — Z809 Family history of malignant neoplasm, unspecified: Secondary | ICD-10-CM | POA: Insufficient documentation

## 2018-08-14 DIAGNOSIS — Z7982 Long term (current) use of aspirin: Secondary | ICD-10-CM | POA: Diagnosis not present

## 2018-08-14 DIAGNOSIS — E039 Hypothyroidism, unspecified: Secondary | ICD-10-CM | POA: Diagnosis not present

## 2018-08-14 DIAGNOSIS — C01 Malignant neoplasm of base of tongue: Secondary | ICD-10-CM | POA: Diagnosis not present

## 2018-08-14 DIAGNOSIS — Z79899 Other long term (current) drug therapy: Secondary | ICD-10-CM

## 2018-08-14 DIAGNOSIS — D751 Secondary polycythemia: Secondary | ICD-10-CM | POA: Diagnosis not present

## 2018-08-14 DIAGNOSIS — K219 Gastro-esophageal reflux disease without esophagitis: Secondary | ICD-10-CM

## 2018-08-14 DIAGNOSIS — R682 Dry mouth, unspecified: Secondary | ICD-10-CM | POA: Diagnosis not present

## 2018-08-14 DIAGNOSIS — N289 Disorder of kidney and ureter, unspecified: Secondary | ICD-10-CM

## 2018-08-14 LAB — CBC WITH DIFFERENTIAL/PLATELET
Abs Immature Granulocytes: 0.02 10*3/uL (ref 0.00–0.07)
Basophils Absolute: 0.1 10*3/uL (ref 0.0–0.1)
Basophils Relative: 1 %
Eosinophils Absolute: 0.1 10*3/uL (ref 0.0–0.5)
Eosinophils Relative: 3 %
HCT: 48.8 % (ref 39.0–52.0)
Hemoglobin: 16.1 g/dL (ref 13.0–17.0)
Immature Granulocytes: 0 %
Lymphocytes Relative: 27 %
Lymphs Abs: 1.2 10*3/uL (ref 0.7–4.0)
MCH: 31.2 pg (ref 26.0–34.0)
MCHC: 33 g/dL (ref 30.0–36.0)
MCV: 94.6 fL (ref 80.0–100.0)
Monocytes Absolute: 0.6 10*3/uL (ref 0.1–1.0)
Monocytes Relative: 13 %
Neutro Abs: 2.6 10*3/uL (ref 1.7–7.7)
Neutrophils Relative %: 56 %
Platelets: 200 10*3/uL (ref 150–400)
RBC: 5.16 MIL/uL (ref 4.22–5.81)
RDW: 13.8 % (ref 11.5–15.5)
WBC: 4.6 10*3/uL (ref 4.0–10.5)
nRBC: 0 % (ref 0.0–0.2)

## 2018-08-14 LAB — COMPREHENSIVE METABOLIC PANEL
ALT: 12 U/L (ref 0–44)
AST: 22 U/L (ref 15–41)
Albumin: 3.6 g/dL (ref 3.5–5.0)
Alkaline Phosphatase: 39 U/L (ref 38–126)
Anion gap: 6 (ref 5–15)
BUN: 18 mg/dL (ref 8–23)
CO2: 26 mmol/L (ref 22–32)
Calcium: 8.4 mg/dL — ABNORMAL LOW (ref 8.9–10.3)
Chloride: 101 mmol/L (ref 98–111)
Creatinine, Ser: 2.1 mg/dL — ABNORMAL HIGH (ref 0.61–1.24)
GFR calc Af Amer: 37 mL/min — ABNORMAL LOW (ref >60)
GFR calc non Af Amer: 32 mL/min — ABNORMAL LOW (ref >60)
Glucose, Bld: 122 mg/dL — ABNORMAL HIGH (ref 70–99)
Potassium: 3.6 mmol/L (ref 3.5–5.1)
Sodium: 133 mmol/L — ABNORMAL LOW (ref 135–145)
Total Bilirubin: 0.6 mg/dL (ref 0.3–1.2)
Total Protein: 7 g/dL (ref 6.5–8.1)

## 2018-08-14 LAB — MAGNESIUM: Magnesium: 2.2 mg/dL (ref 1.7–2.4)

## 2018-08-30 DIAGNOSIS — I10 Essential (primary) hypertension: Secondary | ICD-10-CM | POA: Diagnosis not present

## 2018-08-30 DIAGNOSIS — N183 Chronic kidney disease, stage 3 (moderate): Secondary | ICD-10-CM | POA: Diagnosis not present

## 2018-08-30 DIAGNOSIS — N179 Acute kidney failure, unspecified: Secondary | ICD-10-CM | POA: Diagnosis not present

## 2018-08-30 NOTE — Progress Notes (Signed)
[facility]  [address] [place], [place] [address] Phone: [phone]  Fax: [phone]   Clinic Day:  09/04/2018  Referring physician: Juline Patch, MD  Chief Complaint: Troy [name] is a 68 y.o. 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   HPI: The patient was last seen in the medical oncology clinic on 08/14/2018. At that time, he denied any complaints. Exam was unremarkable. Creatinine was 2.10.  Decision was made to postpone treatment until review of work-up by Dr Holley Raring.  During the interim, the patient is doing good. He notes intermittent dry mouth. His weight is down 2 lbs. He reports a good diet. He denies any new symptoms. He denies any lumps and bumps.   Past Medical History:  Diagnosis Date  . Cancer ([place])   . GERD (gastroesophageal reflux disease)   . Hypertension   . Thyroid disease     Past Surgical History:  Procedure Laterality Date  . tumor removed      Family History  Problem Relation Age of Onset  . Cancer Mother   . Cancer Sister   . Cancer Maternal Aunt   . Cancer Paternal Uncle     Social History:  reports that he has never smoked. He has never used smokeless tobacco. He reports that he does not drink alcohol or use drugs. He lives in [place].He has been helping his sister with yard work. The patient is alone today.  Allergies: No Known Allergies  Current Medications: Current Outpatient Medications  Medication Sig Dispense Refill  . acetaminophen (TYLENOL) 650 MG CR tablet Take 1,300 mg by mouth every 8 (eight) hours.    Marland Kitchen aspirin EC 81 MG tablet Take 81 mg by mouth daily.    Marland Kitchen esomeprazole (NEXIUM) 40 MG capsule Take 1 capsule (40 mg total) by mouth daily. 30 capsule 5  . levothyroxine (SYNTHROID, LEVOTHROID) 125 MCG tablet Take 1 tablet (125 mcg total) by mouth daily. 30 tablet 5  .  lisinopril-hydrochlorothiazide (ZESTORETIC) 10-12.5 MG tablet Take 1 tablet by mouth daily. 30 tablet 5  . amLODipine (NORVASC) 5 MG tablet     . sildenafil (VIAGRA) 100 MG tablet Take 0.5-1 tablets (50-100 mg total) by mouth daily as needed for erectile dysfunction. (Patient not taking: Reported on 08/14/2018) 5 tablet 11   No current facility-administered medications for this visit.     Review of Systems  Constitutional: Positive for weight loss (2 lbs). Negative for chills, fever and malaise/fatigue.       Doing well.  HENT: Negative for congestion, ear discharge, hearing loss, nosebleeds and sore throat.        Intermittent dry mouth.  Eyes: Negative.  Negative for blurred vision, double vision and photophobia.  Respiratory: Negative.  Negative for cough, hemoptysis, sputum production and shortness of breath.   Cardiovascular: Negative.  Negative for chest pain, palpitations, orthopnea and leg swelling.  Gastrointestinal: Negative.  Negative for abdominal pain, blood in stool, constipation, diarrhea, melena, nausea and vomiting.       Eating well.  Genitourinary: Negative.  Negative for dysuria, frequency, hematuria and urgency.  Musculoskeletal: Negative.  Negative for back pain, myalgias and neck pain.  Skin: Negative.  Negative for rash.  Neurological: Negative.  Negative for dizziness, tingling, sensory change, weakness and headaches.  Endo/Heme/Allergies: Negative.  Does not bruise/bleed easily.  Psychiatric/Behavioral: Negative.  Negative for depression and memory loss. The  patient is not nervous/anxious and does not have insomnia.   All other systems reviewed and are negative.  Performance status (ECOG): 1  Vitals Pulse 98, temperature (!) 97.1 F (36.2 C), temperature source Tympanic, resp. rate 18, height 5\' 7"  (1.702 m), weight 171 lb 3 oz (77.7 kg), SpO2 100 %.   Physical Exam  Constitutional: He is oriented to person, place, and time. He appears well-developed and  well-nourished. No distress.  HENT:  Head: Normocephalic and atraumatic.  Mouth/Throat: Oropharynx is clear and moist. No oropharyngeal exudate.  Black hair. Wearing a mask.  Eyes: Pupils are equal, round, and reactive to light. Conjunctivae and EOM are normal. No scleral icterus.  Brown eyes.  Neck: Normal range of motion. Neck supple. No JVD present.  Cardiovascular: Normal rate, regular rhythm and normal heart sounds. Exam reveals no gallop.  No murmur heard. Pulmonary/Chest: Effort normal and breath sounds normal. No respiratory distress. He has no wheezes. He has no rales.  Abdominal: Soft. Bowel sounds are normal. He exhibits no mass. There is no abdominal tenderness. There is no rebound and no guarding.  Musculoskeletal: Normal range of motion.        General: No tenderness or edema.  Lymphadenopathy:    He has no cervical adenopathy.    He has no axillary adenopathy.       Right: No supraclavicular adenopathy present.       Left: No supraclavicular adenopathy present.  Neurological: He is alert and oriented to person, place, and time. He has normal reflexes.  Skin: Skin is warm and dry. No rash noted. He is not diaphoretic. No erythema. No pallor.  Psychiatric: He has a normal mood and affect. His behavior is normal. Judgment and thought content normal.  Nursing note and vitals reviewed.   Appointment on 09/04/2018  Component Date Value Ref Range Status  . WBC 09/04/2018 5.7  4.0 - 10.5 K/uL Final  . RBC 09/04/2018 5.25  4.22 - 5.81 MIL/uL Final  . Hemoglobin 09/04/2018 16.4  13.0 - 17.0 g/dL Final  . HCT 09/04/2018 49.2  39.0 - 52.0 % Final  . MCV 09/04/2018 93.7  80.0 - 100.0 fL Final  . MCH 09/04/2018 31.2  26.0 - 34.0 pg Final  . MCHC 09/04/2018 33.3  30.0 - 36.0 g/dL Final  . RDW 09/04/2018 13.3  11.5 - 15.5 % Final  . Platelets 09/04/2018 208  150 - 400 K/uL Final  . nRBC 09/04/2018 0.0  0.0 - 0.2 % Final  . Neutrophils Relative % 09/04/2018 57  % Final  . Neutro  Abs 09/04/2018 3.3  1.7 - 7.7 K/uL Final  . Lymphocytes Relative 09/04/2018 26  % Final  . Lymphs Abs 09/04/2018 1.5  0.7 - 4.0 K/uL Final  . Monocytes Relative 09/04/2018 13  % Final  . Monocytes Absolute 09/04/2018 0.7  0.1 - 1.0 K/uL Final  . Eosinophils Relative 09/04/2018 2  % Final  . Eosinophils Absolute 09/04/2018 0.1  0.0 - 0.5 K/uL Final  . Basophils Relative 09/04/2018 1  % Final  . Basophils Absolute 09/04/2018 0.1  0.0 - 0.1 K/uL Final  . Immature Granulocytes 09/04/2018 1  % Final  . Abs Immature Granulocytes 09/04/2018 0.03  0.00 - 0.07 K/uL Final   Performed at [facility], [address] [place],  [address]    Assessment:  Troy [name] is a 68 y.o. male with recurrent base of tongue carcinoma. Clinical stagewasT3 N3 M0, p16+ in 11/2014. He received  concurrent cisplatin and radiationfrom 02/017 - 05/21/2015.   PET scanon 09/02/2015 was equivocal. PET scanon 12/07/2015 revealed abnormal FDG uptake (reactive from prior radiation therapy). PET scanon 07/05/2016 revealed an enlarging level 2 lymph node (increasing SUV). PET scanon 04/12/2017 revealed no evidence of hypermetabolic local recurrence in the oral cavity or metastatic disease within the neck, chest, abdomen or pelvis. There was no suspicious metabolic activity.  Neck and chest CTon 04/03/2018 revealed no evidence of residual or recurrent disease.   Biopsyin 07/2017 confirmed recurrence. He was inoperable.   He is s/p 25cycles of Keytruda(pembrolizumab) every 3 weeks (10/27/2016 - 06/08/2018).  He has renal insufficiency.Baseline creatininehas been1.62 - 1.81 in past year.Urinalysis on 07/04/2018 revealed no proteinuria or hematuria.  He has erythrocytosis. Hemoglobin was 16.7 on 04/06/2018. Work-upon 04/27/2018 revealed the followingnormal/negative tests: JAK2 V617F, exon 12-15, CALR, MPL, epo level (13.3). Carbon monoxide was 3.9% (0-3.6%)with repeat 3.0%  on 07/04/2018. Testosteronewas normal on 07/04/2018. He does not smoke. He is unsure if he has sleep apnea. He does not feel well rested in the morning.  Symptomatically, he is doing well.  Exam is unremarkable.  Plan: 1.   Labs today: CBC with diff, CMP, Mg, TSH, free T4. 2.   Recurrent base of tongue carcinoma Clinically, he continues to do well.   Exam reveals no evidence of recurrent disease.   Review overall plan for 2 years of pembrolizumab based on the Keynote-048 trial unless any toxicity  He has received 25 cycles of pembrolizumab to date. Review work-up by Dr. Holley Raring and nephrology.  Increased creatinine is not felt secondary to pembrolizumab. Discuss plan to reinstitute pembrolizumab.  Discuss potential increase in creatinine if pembrolizumab is the causative agent.  Patient consented to treatment. PET scan on 09/21/2018. 3.   Renal insufficiency Patient has acute on chronic renal insufficiency.              Nephrology work-up complete. Baseline creatinine 1.62 - 1.81 in past year. Creatinine was 2.12 on 07/04/2018 and 2.20 today. 4.   Hypothyroidism Patient on levothyroxine. Monitor thyroid function with each cycle of pembrolizumab. 5.Erythrocytosis, improved Hemoglobin 16.4 today. JAK2 with reflex, carbon monoxide, epo level, and testosterone were normal.  Continue to monitor. 6.   RTC in 3 weeks for MD assessment, labs (CBC with diff, CMP, TSH), review of PET scan, and pembrolizumab.  I discussed the assessment and treatment plan with the patient.  The patient was provided an opportunity to ask questions and all were answered.  The patient agreed with the plan and demonstrated an understanding of the instructions.  The patient was advised to call back if the symptoms worsen or if the condition fails to improve as anticipated.   Lequita Asal, MD, PhD    09/04/2018, 9:51 AM  I, Selena Batten,  am acting as scribe for [employer]. Mike Gip, MD, PhD.  I, Melissa C. Mike Gip, MD, have reviewed the above documentation for accuracy and completeness, and I agree with the above.

## 2018-09-04 ENCOUNTER — Other Ambulatory Visit: Payer: Self-pay

## 2018-09-04 ENCOUNTER — Inpatient Hospital Stay: Payer: Medicare Other

## 2018-09-04 ENCOUNTER — Inpatient Hospital Stay: Payer: Medicare Other | Attending: Hematology and Oncology | Admitting: Hematology and Oncology

## 2018-09-04 ENCOUNTER — Encounter: Payer: Self-pay | Admitting: Hematology and Oncology

## 2018-09-04 VITALS — BP 116/77 | HR 80 | Temp 96.9°F | Resp 18

## 2018-09-04 VITALS — HR 98 | Temp 97.1°F | Resp 18 | Ht 67.0 in | Wt 171.2 lb

## 2018-09-04 DIAGNOSIS — Z7982 Long term (current) use of aspirin: Secondary | ICD-10-CM

## 2018-09-04 DIAGNOSIS — Z5112 Encounter for antineoplastic immunotherapy: Secondary | ICD-10-CM

## 2018-09-04 DIAGNOSIS — N289 Disorder of kidney and ureter, unspecified: Secondary | ICD-10-CM

## 2018-09-04 DIAGNOSIS — D751 Secondary polycythemia: Secondary | ICD-10-CM

## 2018-09-04 DIAGNOSIS — C01 Malignant neoplasm of base of tongue: Secondary | ICD-10-CM

## 2018-09-04 DIAGNOSIS — Z809 Family history of malignant neoplasm, unspecified: Secondary | ICD-10-CM

## 2018-09-04 DIAGNOSIS — R682 Dry mouth, unspecified: Secondary | ICD-10-CM | POA: Diagnosis not present

## 2018-09-04 DIAGNOSIS — K219 Gastro-esophageal reflux disease without esophagitis: Secondary | ICD-10-CM | POA: Diagnosis not present

## 2018-09-04 DIAGNOSIS — I10 Essential (primary) hypertension: Secondary | ICD-10-CM

## 2018-09-04 DIAGNOSIS — Z79899 Other long term (current) drug therapy: Secondary | ICD-10-CM | POA: Diagnosis not present

## 2018-09-04 DIAGNOSIS — R7989 Other specified abnormal findings of blood chemistry: Secondary | ICD-10-CM

## 2018-09-04 DIAGNOSIS — E079 Disorder of thyroid, unspecified: Secondary | ICD-10-CM | POA: Diagnosis not present

## 2018-09-04 DIAGNOSIS — Z7189 Other specified counseling: Secondary | ICD-10-CM

## 2018-09-04 LAB — COMPREHENSIVE METABOLIC PANEL
ALT: 13 U/L (ref 0–44)
AST: 18 U/L (ref 15–41)
Albumin: 3.8 g/dL (ref 3.5–5.0)
Alkaline Phosphatase: 39 U/L (ref 38–126)
Anion gap: 6 (ref 5–15)
BUN: 20 mg/dL (ref 8–23)
CO2: 26 mmol/L (ref 22–32)
Calcium: 9 mg/dL (ref 8.9–10.3)
Chloride: 101 mmol/L (ref 98–111)
Creatinine, Ser: 2.2 mg/dL — ABNORMAL HIGH (ref 0.61–1.24)
GFR calc Af Amer: 35 mL/min — ABNORMAL LOW (ref >60)
GFR calc non Af Amer: 30 mL/min — ABNORMAL LOW (ref >60)
Glucose, Bld: 115 mg/dL — ABNORMAL HIGH (ref 70–99)
Potassium: 3.6 mmol/L (ref 3.5–5.1)
Sodium: 133 mmol/L — ABNORMAL LOW (ref 135–145)
Total Bilirubin: 0.9 mg/dL (ref 0.3–1.2)
Total Protein: 7.6 g/dL (ref 6.5–8.1)

## 2018-09-04 LAB — CBC WITH DIFFERENTIAL/PLATELET
Abs Immature Granulocytes: 0.03 10*3/uL (ref 0.00–0.07)
Basophils Absolute: 0.1 10*3/uL (ref 0.0–0.1)
Basophils Relative: 1 %
Eosinophils Absolute: 0.1 10*3/uL (ref 0.0–0.5)
Eosinophils Relative: 2 %
HCT: 49.2 % (ref 39.0–52.0)
Hemoglobin: 16.4 g/dL (ref 13.0–17.0)
Immature Granulocytes: 1 %
Lymphocytes Relative: 26 %
Lymphs Abs: 1.5 10*3/uL (ref 0.7–4.0)
MCH: 31.2 pg (ref 26.0–34.0)
MCHC: 33.3 g/dL (ref 30.0–36.0)
MCV: 93.7 fL (ref 80.0–100.0)
Monocytes Absolute: 0.7 10*3/uL (ref 0.1–1.0)
Monocytes Relative: 13 %
Neutro Abs: 3.3 10*3/uL (ref 1.7–7.7)
Neutrophils Relative %: 57 %
Platelets: 208 10*3/uL (ref 150–400)
RBC: 5.25 MIL/uL (ref 4.22–5.81)
RDW: 13.3 % (ref 11.5–15.5)
WBC: 5.7 10*3/uL (ref 4.0–10.5)
nRBC: 0 % (ref 0.0–0.2)

## 2018-09-04 LAB — TSH: TSH: 0.32 u[IU]/mL — ABNORMAL LOW (ref 0.350–4.500)

## 2018-09-04 LAB — T4, FREE: Free T4: 1.09 ng/dL (ref 0.61–1.12)

## 2018-09-04 LAB — MAGNESIUM: Magnesium: 2 mg/dL (ref 1.7–2.4)

## 2018-09-04 MED ORDER — SODIUM CHLORIDE 0.9 % IV SOLN
Freq: Once | INTRAVENOUS | Status: AC
  Administered 2018-09-04: 10:00:00 via INTRAVENOUS
  Filled 2018-09-04: qty 250

## 2018-09-04 MED ORDER — SODIUM CHLORIDE 0.9 % IV SOLN
200.0000 mg | Freq: Once | INTRAVENOUS | Status: AC
  Administered 2018-09-04: 200 mg via INTRAVENOUS
  Filled 2018-09-04: qty 8

## 2018-09-04 NOTE — Progress Notes (Signed)
No new changes noted today 

## 2018-09-04 NOTE — Progress Notes (Signed)
Dr Mike Gip okay to proceed with Treatment today. Dr Holley Raring was called for elevated creatinine today he too is okay to  Proceed with treatment.

## 2018-09-16 DIAGNOSIS — R7989 Other specified abnormal findings of blood chemistry: Secondary | ICD-10-CM | POA: Insufficient documentation

## 2018-09-20 ENCOUNTER — Ambulatory Visit
Admission: RE | Admit: 2018-09-20 | Discharge: 2018-09-20 | Disposition: A | Discharge status: Home / Self Care | Payer: Medicare Other | Source: Ambulatory Visit | Attending: Hematology and Oncology | Admitting: Hematology and Oncology

## 2018-09-20 ENCOUNTER — Other Ambulatory Visit: Payer: Self-pay

## 2018-09-20 DIAGNOSIS — I700 Atherosclerosis of aorta: Secondary | ICD-10-CM | POA: Diagnosis not present

## 2018-09-20 DIAGNOSIS — I2510 Atherosclerotic heart disease of native coronary artery without angina pectoris: Secondary | ICD-10-CM | POA: Diagnosis not present

## 2018-09-20 DIAGNOSIS — C01 Malignant neoplasm of base of tongue: Secondary | ICD-10-CM | POA: Insufficient documentation

## 2018-09-20 DIAGNOSIS — C029 Malignant neoplasm of tongue, unspecified: Secondary | ICD-10-CM | POA: Diagnosis not present

## 2018-09-20 LAB — GLUCOSE, CAPILLARY: Glucose-Capillary: 111 mg/dL — ABNORMAL HIGH (ref 70–99)

## 2018-09-20 MED ORDER — FLUDEOXYGLUCOSE F - 18 (FDG) INJECTION
8.9000 | Freq: Once | INTRAVENOUS | Status: AC | PRN
  Administered 2018-09-20: 9.41 via INTRAVENOUS

## 2018-09-25 ENCOUNTER — Ambulatory Visit: Payer: Medicare Other

## 2018-09-25 ENCOUNTER — Ambulatory Visit: Payer: Medicare Other | Admitting: Hematology and Oncology

## 2018-09-25 ENCOUNTER — Other Ambulatory Visit: Payer: Medicare Other

## 2018-10-04 ENCOUNTER — Other Ambulatory Visit: Payer: Self-pay

## 2018-10-05 ENCOUNTER — Other Ambulatory Visit: Payer: Self-pay | Admitting: Internal Medicine

## 2018-10-05 ENCOUNTER — Encounter: Payer: Self-pay | Admitting: Nurse Practitioner

## 2018-10-05 ENCOUNTER — Inpatient Hospital Stay: Payer: Medicare Other | Attending: Hematology and Oncology

## 2018-10-05 ENCOUNTER — Inpatient Hospital Stay (HOSPITAL_BASED_OUTPATIENT_CLINIC_OR_DEPARTMENT_OTHER): Payer: Medicare Other | Admitting: Nurse Practitioner

## 2018-10-05 ENCOUNTER — Inpatient Hospital Stay: Payer: Medicare Other

## 2018-10-05 VITALS — BP 154/93 | HR 89 | Temp 97.2°F | Wt 175.3 lb

## 2018-10-05 DIAGNOSIS — Z79899 Other long term (current) drug therapy: Secondary | ICD-10-CM | POA: Insufficient documentation

## 2018-10-05 DIAGNOSIS — I129 Hypertensive chronic kidney disease with stage 1 through stage 4 chronic kidney disease, or unspecified chronic kidney disease: Secondary | ICD-10-CM | POA: Diagnosis not present

## 2018-10-05 DIAGNOSIS — C01 Malignant neoplasm of base of tongue: Secondary | ICD-10-CM | POA: Diagnosis not present

## 2018-10-05 DIAGNOSIS — Z809 Family history of malignant neoplasm, unspecified: Secondary | ICD-10-CM | POA: Diagnosis not present

## 2018-10-05 DIAGNOSIS — Z7982 Long term (current) use of aspirin: Secondary | ICD-10-CM | POA: Insufficient documentation

## 2018-10-05 DIAGNOSIS — K219 Gastro-esophageal reflux disease without esophagitis: Secondary | ICD-10-CM | POA: Diagnosis not present

## 2018-10-05 DIAGNOSIS — D751 Secondary polycythemia: Secondary | ICD-10-CM | POA: Diagnosis not present

## 2018-10-05 DIAGNOSIS — Z5112 Encounter for antineoplastic immunotherapy: Secondary | ICD-10-CM | POA: Diagnosis not present

## 2018-10-05 DIAGNOSIS — E039 Hypothyroidism, unspecified: Secondary | ICD-10-CM | POA: Diagnosis not present

## 2018-10-05 DIAGNOSIS — N189 Chronic kidney disease, unspecified: Secondary | ICD-10-CM | POA: Insufficient documentation

## 2018-10-05 DIAGNOSIS — N289 Disorder of kidney and ureter, unspecified: Secondary | ICD-10-CM

## 2018-10-05 LAB — COMPREHENSIVE METABOLIC PANEL
ALT: 11 U/L (ref 0–44)
AST: 16 U/L (ref 15–41)
Albumin: 3.9 g/dL (ref 3.5–5.0)
Alkaline Phosphatase: 41 U/L (ref 38–126)
Anion gap: 8 (ref 5–15)
BUN: 17 mg/dL (ref 8–23)
CO2: 27 mmol/L (ref 22–32)
Calcium: 9.1 mg/dL (ref 8.9–10.3)
Chloride: 101 mmol/L (ref 98–111)
Creatinine, Ser: 1.77 mg/dL — ABNORMAL HIGH (ref 0.61–1.24)
GFR calc Af Amer: 45 mL/min — ABNORMAL LOW (ref >60)
GFR calc non Af Amer: 39 mL/min — ABNORMAL LOW (ref >60)
Glucose, Bld: 111 mg/dL — ABNORMAL HIGH (ref 70–99)
Potassium: 4.1 mmol/L (ref 3.5–5.1)
Sodium: 136 mmol/L (ref 135–145)
Total Bilirubin: 0.8 mg/dL (ref 0.3–1.2)
Total Protein: 7.3 g/dL (ref 6.5–8.1)

## 2018-10-05 LAB — CBC WITH DIFFERENTIAL/PLATELET
Abs Immature Granulocytes: 0.01 10*3/uL (ref 0.00–0.07)
Basophils Absolute: 0 10*3/uL (ref 0.0–0.1)
Basophils Relative: 1 %
Eosinophils Absolute: 0.2 10*3/uL (ref 0.0–0.5)
Eosinophils Relative: 3 %
HCT: 46.5 % (ref 39.0–52.0)
Hemoglobin: 15.7 g/dL (ref 13.0–17.0)
Immature Granulocytes: 0 %
Lymphocytes Relative: 28 %
Lymphs Abs: 1.4 10*3/uL (ref 0.7–4.0)
MCH: 30.8 pg (ref 26.0–34.0)
MCHC: 33.8 g/dL (ref 30.0–36.0)
MCV: 91.2 fL (ref 80.0–100.0)
Monocytes Absolute: 0.6 10*3/uL (ref 0.1–1.0)
Monocytes Relative: 12 %
Neutro Abs: 2.9 10*3/uL (ref 1.7–7.7)
Neutrophils Relative %: 56 %
Platelets: 197 10*3/uL (ref 150–400)
RBC: 5.1 MIL/uL (ref 4.22–5.81)
RDW: 12.5 % (ref 11.5–15.5)
WBC: 5.2 10*3/uL (ref 4.0–10.5)
nRBC: 0 % (ref 0.0–0.2)

## 2018-10-05 LAB — MAGNESIUM: Magnesium: 1.9 mg/dL (ref 1.7–2.4)

## 2018-10-05 LAB — TSH: TSH: 0.617 u[IU]/mL (ref 0.350–4.500)

## 2018-10-05 MED ORDER — SODIUM CHLORIDE 0.9 % IV SOLN
200.0000 mg | Freq: Once | INTRAVENOUS | Status: AC
  Administered 2018-10-05: 200 mg via INTRAVENOUS
  Filled 2018-10-05: qty 8

## 2018-10-05 MED ORDER — SODIUM CHLORIDE 0.9 % IV SOLN
Freq: Once | INTRAVENOUS | Status: AC
  Administered 2018-10-05: 09:00:00 via INTRAVENOUS
  Filled 2018-10-05: qty 250

## 2018-10-05 NOTE — Patient Instructions (Signed)
Pembrolizumab injection What is this medicine? PEMBROLIZUMAB (pem broe liz ue mab) is a monoclonal antibody. It is used to treat bladder cancer, cervical cancer, endometrial cancer, esophageal cancer, head and neck cancer, hepatocellular cancer, Hodgkin lymphoma, kidney cancer, lymphoma, melanoma, Merkel cell carcinoma, lung cancer, stomach cancer, urothelial cancer, and cancers that have a certain genetic condition. This medicine may be used for other purposes; ask your health care provider or pharmacist if you have questions. COMMON BRAND NAME(S): Keytruda What should I tell my health care provider before I take this medicine? They need to know if you have any of these conditions:  diabetes  immune system problems  inflammatory bowel disease  liver disease  lung or breathing disease  lupus  received or scheduled to receive an organ transplant or a stem-cell transplant that uses donor stem cells  an unusual or allergic reaction to pembrolizumab, other medicines, foods, dyes, or preservatives  pregnant or trying to get pregnant  breast-feeding How should I use this medicine? This medicine is for infusion into a vein. It is given by a health care professional in a hospital or clinic setting. A special MedGuide will be given to you before each treatment. Be sure to read this information carefully each time. Talk to your pediatrician regarding the use of this medicine in children. While this drug may be prescribed for selected conditions, precautions do apply. Overdosage: If you think you have taken too much of this medicine contact a poison control center or emergency room at once. NOTE: This medicine is only for you. Do not share this medicine with others. What if I miss a dose? It is important not to miss your dose. Call your doctor or health care professional if you are unable to keep an appointment. What may interact with this medicine? Interactions have not been studied. Give  your health care provider a list of all the medicines, herbs, non-prescription drugs, or dietary supplements you use. Also tell them if you smoke, drink alcohol, or use illegal drugs. Some items may interact with your medicine. This list may not describe all possible interactions. Give your health care provider a list of all the medicines, herbs, non-prescription drugs, or dietary supplements you use. Also tell them if you smoke, drink alcohol, or use illegal drugs. Some items may interact with your medicine. What should I watch for while using this medicine? Your condition will be monitored carefully while you are receiving this medicine. You may need blood work done while you are taking this medicine. Do not become pregnant while taking this medicine or for 4 months after stopping it. Women should inform their doctor if they wish to become pregnant or think they might be pregnant. There is a potential for serious side effects to an unborn child. Talk to your health care professional or pharmacist for more information. Do not breast-feed an infant while taking this medicine or for 4 months after the last dose. What side effects may I notice from receiving this medicine? Side effects that you should report to your doctor or health care professional as soon as possible:  allergic reactions like skin rash, itching or hives, swelling of the face, lips, or tongue  bloody or black, tarry  breathing problems  changes in vision  chest pain  chills  confusion  constipation  cough  diarrhea  dizziness or feeling faint or lightheaded  fast or irregular heartbeat  fever  flushing  hair loss  joint pain  low blood counts - this   medicine may decrease the number of white blood cells, red blood cells and platelets. You may be at increased risk for infections and bleeding.  muscle pain  muscle weakness  persistent headache  redness, blistering, peeling or loosening of the skin,  including inside the mouth  signs and symptoms of high blood sugar such as dizziness; dry mouth; dry skin; fruity breath; nausea; stomach pain; increased hunger or thirst; increased urination  signs and symptoms of kidney injury like trouble passing urine or change in the amount of urine  signs and symptoms of liver injury like dark urine, light-colored stools, loss of appetite, nausea, right upper belly pain, yellowing of the eyes or skin  sweating  swollen lymph nodes  weight loss Side effects that usually do not require medical attention (report to your doctor or health care professional if they continue or are bothersome):  decreased appetite  muscle pain  tiredness This list may not describe all possible side effects. Call your doctor for medical advice about side effects. You may report side effects to FDA at 1-800-FDA-1088. Where should I keep my medicine? This drug is given in a hospital or clinic and will not be stored at home. NOTE: This sheet is a summary. It may not cover all possible information. If you have questions about this medicine, talk to your doctor, pharmacist, or health care provider.  2020 Elsevier/Gold Standard (2018-02-27 13:46:58)  

## 2018-10-05 NOTE — Progress Notes (Signed)
Patient here for follow up. Denies any concerns.  

## 2018-10-05 NOTE — Progress Notes (Signed)
[facility]  [address], [address] [place], [place] [address] Phone: [phone]  Fax: [phone]   Clinic Day:  10/05/2018  Referring physician: Juline Patch, MD  Chief Complaint: Troy [name] is a 68 y.o. 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  HPI: He was last seen in medical oncology clinic by Dr. Mike Gip on 09/04/2018.  Previously, he had increased serum creatinine and saw Dr. Holley Raring with nephrology.  Increased creatinine was not felt to be secondary to pembrolizumab and treatment was restarted.  He underwent a PET scan on 09/20/2018.  Today, he reports feeling well.  He is anxious regarding results of PET scan.  He denies new symptoms and continues to have intermittent dry mouth.  Weight is up 4 pounds today.  Denies any new lumps or bumps.  Denies fever or chills.  Denies cough or shortness of breath.  He has been social distancing and is wearing a mask today as part of COVID-19 pandemic precautions.  He says he has been tolerating Keytruda well without significant side effects.  Reports compliance with his levothyroxine.   Past Medical History:  Diagnosis Date  . Cancer ([place])   . GERD (gastroesophageal reflux disease)   . Hypertension   . Thyroid disease     Past Surgical History:  Procedure Laterality Date  . tumor removed      Family History  Problem Relation Age of Onset  . Cancer Mother   . Cancer Sister   . Cancer Maternal Aunt   . Cancer Paternal Uncle     Social History:  reports that he has never smoked. He has never used smokeless tobacco. He reports that he does not drink alcohol or use drugs. He lives in [place]. He is close with his sister.  Allergies: No Known Allergies  Current Medications: Current Outpatient Medications  Medication Sig Dispense Refill  . acetaminophen  (TYLENOL) 650 MG CR tablet Take 1,300 mg by mouth every 8 (eight) hours.    Marland Kitchen amLODipine (NORVASC) 5 MG tablet     . aspirin EC 81 MG tablet Take 81 mg by mouth daily.    Marland Kitchen esomeprazole (NEXIUM) 40 MG capsule Take 1 capsule (40 mg total) by mouth daily. 30 capsule 5  . levothyroxine (SYNTHROID, LEVOTHROID) 125 MCG tablet Take 1 tablet (125 mcg total) by mouth daily. 30 tablet 5  . lisinopril-hydrochlorothiazide (ZESTORETIC) 10-12.5 MG tablet Take 1 tablet by mouth daily. 30 tablet 5  . sildenafil (VIAGRA) 100 MG tablet Take 0.5-1 tablets (50-100 mg total) by mouth daily as needed for erectile dysfunction. 5 tablet 11   No current facility-administered medications for this visit.     Review of Systems  Constitutional: Negative for chills, fever, malaise/fatigue and weight loss.       Doing well.  HENT: Negative for congestion, ear discharge, hearing loss, nosebleeds and sore throat.        Intermittent dry mouth.  Eyes: Negative.  Negative for blurred vision, double vision and photophobia.  Respiratory: Negative.  Negative for cough, hemoptysis, sputum production and shortness of breath.   Cardiovascular: Negative.  Negative for chest pain, palpitations, orthopnea and leg swelling.  Gastrointestinal: Negative.  Negative for abdominal pain, blood in stool, constipation, diarrhea, melena, nausea and vomiting.  Genitourinary: Negative.  Negative for dysuria, frequency, hematuria and urgency.  Musculoskeletal: Negative.  Negative for  back pain, myalgias and neck pain.  Skin: Negative.  Negative for rash.  Neurological: Negative.  Negative for dizziness, tingling, sensory change, weakness and headaches.  Endo/Heme/Allergies: Negative.  Does not bruise/bleed easily.  Psychiatric/Behavioral: Negative for depression and memory loss. The patient is nervous/anxious. The patient does not have insomnia.   All other systems reviewed and are negative.  Performance status (ECOG): 1  Vitals Blood  pressure (!) 154/93, pulse 89, temperature (!) 97.2 F (36.2 C), temperature source Tympanic, weight 175 lb 4.3 oz (79.5 kg), SpO2 100 %.   Physical Exam  Constitutional: He is oriented to person, place, and time. He appears well-developed and well-nourished. No distress.  HENT:  Head: Normocephalic and atraumatic.  Mouth/Throat: Oropharynx is clear and moist. No oropharyngeal exudate.  Black hair. Wearing a mask.  Eyes: Pupils are equal, round, and reactive to light. Conjunctivae and EOM are normal. No scleral icterus.  Brown eyes.  Neck: Normal range of motion. Neck supple. No JVD present.  Cardiovascular: Normal rate, regular rhythm and normal heart sounds. Exam reveals no gallop.  No murmur heard. Pulmonary/Chest: Effort normal and breath sounds normal. No respiratory distress. He has no wheezes. He has no rales.  Abdominal: Soft. Bowel sounds are normal. He exhibits no mass. There is no abdominal tenderness. There is no rebound and no guarding.  Musculoskeletal: Normal range of motion.        General: No tenderness or edema.  Lymphadenopathy:    He has no cervical adenopathy.    He has no axillary adenopathy.       Right: No supraclavicular adenopathy present.       Left: No supraclavicular adenopathy present.  Neurological: He is alert and oriented to person, place, and time. He has normal reflexes.  Skin: Skin is warm and dry. No rash noted. He is not diaphoretic. No erythema. No pallor.  Psychiatric: His behavior is normal. Judgment and thought content normal. His mood appears anxious.  Nursing note and vitals reviewed.   Appointment on 10/05/2018  Component Date Value Ref Range Status  . Sodium 10/05/2018 136  135 - 145 mmol/L Final  . Potassium 10/05/2018 4.1  3.5 - 5.1 mmol/L Final  . Chloride 10/05/2018 101  98 - 111 mmol/L Final  . CO2 10/05/2018 27  22 - 32 mmol/L Final  . Glucose, Bld 10/05/2018 111* 70 - 99 mg/dL Final  . BUN 10/05/2018 17  8 - 23 mg/dL Final  .  Creatinine, Ser 10/05/2018 1.77* 0.61 - 1.24 mg/dL Final  . Calcium 10/05/2018 9.1  8.9 - 10.3 mg/dL Final  . Total Protein 10/05/2018 7.3  6.5 - 8.1 g/dL Final  . Albumin 10/05/2018 3.9  3.5 - 5.0 g/dL Final  . AST 10/05/2018 16  15 - 41 U/L Final  . ALT 10/05/2018 11  0 - 44 U/L Final  . Alkaline Phosphatase 10/05/2018 41  38 - 126 U/L Final  . Total Bilirubin 10/05/2018 0.8  0.3 - 1.2 mg/dL Final  . GFR calc non Af Amer 10/05/2018 39* >60 mL/min Final  . GFR calc Af Amer 10/05/2018 45* >60 mL/min Final  . Anion gap 10/05/2018 8  5 - 15 Final   Performed at [facility], [address], [place], [place] [address]  . WBC 10/05/2018 5.2  4.0 - 10.5 K/uL Final  . RBC 10/05/2018 5.10  4.22 - 5.81 MIL/uL Final  . Hemoglobin 10/05/2018 15.7  13.0 - 17.0 g/dL Final  . HCT 10/05/2018 46.5  39.0 - 52.0 % Final  . MCV 10/05/2018 91.2  80.0 - 100.0 fL Final  . MCH 10/05/2018 30.8  26.0 - 34.0 pg Final  . MCHC 10/05/2018 33.8  30.0 - 36.0 g/dL Final  . RDW 10/05/2018 12.5  11.5 - 15.5 % Final  . Platelets 10/05/2018 197  150 - 400 K/uL Final  . nRBC 10/05/2018 0.0  0.0 - 0.2 % Final  . Neutrophils Relative % 10/05/2018 56  % Final  . Neutro Abs 10/05/2018 2.9  1.7 - 7.7 K/uL Final  . Lymphocytes Relative 10/05/2018 28  % Final  . Lymphs Abs 10/05/2018 1.4  0.7 - 4.0 K/uL Final  . Monocytes Relative 10/05/2018 12  % Final  . Monocytes Absolute 10/05/2018 0.6  0.1 - 1.0 K/uL Final  . Eosinophils Relative 10/05/2018 3  % Final  . Eosinophils Absolute 10/05/2018 0.2  0.0 - 0.5 K/uL Final  . Basophils Relative 10/05/2018 1  % Final  . Basophils Absolute 10/05/2018 0.0  0.0 - 0.1 K/uL Final  . Immature Granulocytes 10/05/2018 0  % Final  . Abs Immature Granulocytes 10/05/2018 0.01  0.00 - 0.07 K/uL Final   Performed at [facility], [address], [place], [place] [address]  . Magnesium 10/05/2018 1.9  1.7 - 2.4 mg/dL Final   Performed at [facility],  [address] [place], [place] [address]    Assessment:  Troy [name] is a 68 y.o. male with recurrent base of tongue carcinoma. Clinical stagewasT3 N3 M0, p16+ in 11/2014. He received concurrent cisplatin and radiationfrom 03/2015 - 05/21/2015.   PET scanon 09/02/2015 was equivocal. PET scanon 12/07/2015 revealed abnormal FDG uptake (reactive from prior radiation therapy).   PET scanon 07/05/2016 revealed an enlarging level 2 lymph node (increasing SUV).   PET scanon 04/12/2017 revealed no evidence of hypermetabolic local recurrence in the oral cavity or metastatic disease within the neck, chest, abdomen or pelvis. There was no suspicious metabolic activity.   Biopsyin 07/2017 confirmed recurrence. He had several delays in treatment but was scheduled to have surgery.  Unfortunately, he was found that recurrence was inoperable.   Based on KEYNOTE-048 trial, he initiated palliative Keytruda (pembrolizumab) every 3 weeks on 10/27/2016.  He will plan for 2 years of treatment any toxicity.  Neck and chest CTon 04/03/2018 revealed no evidence of residual or recurrent disease. He has chronic posttreatment changes of the tongue.  Chronic scarring and right parotid and right neck.  PET scan on 09/20/2018 was independently reviewed and I agree with radiologist findings which show no specific findings suggestive of local recurrence or distant metastatic disease. There was a new asymmetric focus of increased uptake posterior to left mandibular condyle.   He has renal insufficiency.Baseline creatininehas been1.62 - 1.81 in past year.Urinalysis on 07/04/2018 revealed no proteinuria or hematuria.  He has erythrocytosis. Hemoglobin was 16.7 on 04/06/2018. Work-upon 04/27/2018 revealed the followingnormal/negative tests: JAK2 V617F, exon 12-15, CALR, MPL, epo level (13.3). Carbon monoxide was 3.9% (0-3.6%)with repeat 3.0% on 07/04/2018. Testosteronewas normal on 07/04/2018. He does  not smoke. No formal diagnosis of sleep apnea but symptomatic.  Encouraged him to discuss sleep testing with his PCP.  Clinically, he is doing well and tolerating Keytruda without significant side effects.  We reviewed findings of the PET scan in detail including asymmetric area of increased uptake in soft tissue between the left mandibular condyle and mastoid air cells with SUV of 4.39 as prior exam.  Also seen on CT.  Asymptomatic on exam today.   Plan: 1.   Labs today: CBC with differential, CMP, TSH, Mg 2.  Recurrent base of tongue carcinoma  - Clinically, no evidence of progression  - Will get MRI to further evaluate left mandibular condyle abnormality on  PET scan  -Labs today reviewed and acceptable for continuation of palliative  pembrolizumab based on [id number]-048 trial. Tolerating well without  significant side effects or evidence of toxicity.  He will have received 2  years of treatment in September 2020.   -Again reinforced that we cannot rule out that pembrolizumab may be  contributing to increased creatinine.  Results today stable.  He wishes to  continue treatment\ 3.   Renal insufficiency  -Acute on chronic renal insufficiency-Baseline creatinine 1.62-1.81 with  increases to 2.1-2.2 in May 2020-July 2020.   -Work-up by Dr. Holley Raring and nephrology  -Increased serum creatinine not felt to be secondary to immunotherapy.    -Creatinine today was 1.77 2 is more consistent with his baseline.  -Again encouraged avoidance of nephrotoxic medications and good oral  hydration 4.   Hypothyroidism -On levothyroxine per PCP -Reports compliance with medication -TSH on 09/04/2022 0.320, free T4 was 1.09.  Levels consistent with subclinical hypothyroidism.  -TSH today pending -In setting of pembrolizumab, will check TSH and free T4 at next visit.   5.Erythrocytosis  - JAK2 with reflex, carbon monoxide, EPO level, and testosterone were  previously normal  -Hemoglobin today is 15.7  -Resolved.   Continue to monitor. 6.   Goals of Care/Advanced Care Planning  - Given stage of disease consider discussion of advanced care planning  including MOST form, HCPOA, and LW at next visit or consider referral  to outpatient palliative care in [facility] for either in person or  telemedicine visit.   DISPOSITION: RTC in 3 weeks for labs (CBC with differential, CMP, TSH, free T4), discussion of MRI results, and evaluation and consideration of pembrolizumab with MD   I discussed the assessment and treatment plan with the patient.  The patient was provided with an opportunity ask questions and all were answered.  Patient agreed with the plan and demonstrated an understanding of the instructions with verbal teach back.  The patient was advised to call the clinic if symptoms worsen or if condition fails to improve as anticipated.  Beckey Rutter, DNP, AGNP-C [facility] [place] (work cell) [phone] (office)  CC: Dr. Mike Gip

## 2018-10-19 ENCOUNTER — Ambulatory Visit
Admission: RE | Admit: 2018-10-19 | Discharge: 2018-10-19 | Disposition: A | Discharge status: Home / Self Care | Payer: Medicare Other | Source: Ambulatory Visit | Attending: Nurse Practitioner | Admitting: Nurse Practitioner

## 2018-10-19 ENCOUNTER — Other Ambulatory Visit: Payer: Self-pay

## 2018-10-19 DIAGNOSIS — C01 Malignant neoplasm of base of tongue: Secondary | ICD-10-CM | POA: Insufficient documentation

## 2018-10-25 NOTE — Progress Notes (Signed)
[facility]  [address] [place], [place] [address] Phone: [phone]  Fax: [phone]   Clinic Day:  10/26/2018  Referring physician: Juline Patch, MD  Chief Complaint: Troy [name] is a 68 y.o. male with recurrent squamous cell carcinoma of the base of tonguewho is seen for review interval MRI and continuation of pembrolizumab.  HPI: The patient was last seen in the medical oncology clinic on 10/05/2018 by Beckey Rutter, NP. At that time, he was doing well and tolerating Keytruda without significant side effects. PET scan was reviewed. He was asymptomatic. Exam was unremarkable.  Creatinine was 1.77.  He received pembrolizumab.   PET scan on 09/20/2018 revealed no specific findings identified to suggest recurrent tumor within the base of tongue or cervical nodal metastasis.  There was no evidence for distant FDG avid metastatic disease.  There was a new, asymmetric focus of increased radiotracer uptake localizing to the area posterior to the left mandibular condyle.  There was a subtle area of increased soft tissue noted on the corresponding CT images. The appearance was nonspecific.  Soft tissue neck MRI on 10/19/2018 revealed no anatomic explanation for the uptake posterior to the left mandibular condyle. There was no mass or metastatic disease identified by MRI.  During the interim, he has felt "pretty good".  He denies any jaw pain.  He denies any shortness of breath or cough.  He is currently staying with his sister.   Past Medical History:  Diagnosis Date  . Cancer ([place])   . GERD (gastroesophageal reflux disease)   . Hypertension   . Thyroid disease     Past Surgical History:  Procedure Laterality Date  . tumor removed      Family History  Problem Relation Age of Onset  . Cancer Mother   . Cancer Sister   . Cancer Maternal Aunt   . Cancer Paternal Uncle     Social History:  reports that he has never smoked. He has never  used smokeless tobacco. He reports that he does not drink alcohol or use drugs. He lives in [place].He has been helping his sister with yard work. He is currently staying with his sister.  The patient is alone today.  Allergies: No Known Allergies  Current Medications: Current Outpatient Medications  Medication Sig Dispense Refill  . acetaminophen (TYLENOL) 650 MG CR tablet Take 1,300 mg by mouth every 8 (eight) hours.    Marland Kitchen amLODipine (NORVASC) 5 MG tablet     . aspirin EC 81 MG tablet Take 81 mg by mouth daily.    Marland Kitchen esomeprazole (NEXIUM) 40 MG capsule Take 1 capsule (40 mg total) by mouth daily. 30 capsule 5  . levothyroxine (SYNTHROID, LEVOTHROID) 125 MCG tablet Take 1 tablet (125 mcg total) by mouth daily. 30 tablet 5  . lisinopril-hydrochlorothiazide (ZESTORETIC) 10-12.5 MG tablet Take 1 tablet by mouth daily. 30 tablet 5  . sildenafil (VIAGRA) 100 MG tablet Take 0.5-1 tablets (50-100 mg total) by mouth daily as needed for erectile dysfunction. (Patient not taking: Reported on 10/26/2018) 5 tablet 11   No current facility-administered medications for this visit.     Review of Systems  Constitutional: Negative.  Negative for chills, fever, malaise/fatigue and weight loss (up 1 pound).       Feels "pretty good".  HENT: Negative.  Negative for congestion, ear discharge, ear pain, hearing loss, nosebleeds, sinus pain and sore throat.        No jaw pain.  Eyes:  Negative.  Negative for blurred vision and double vision.  Respiratory: Negative.  Negative for cough, hemoptysis, sputum production and shortness of breath.   Cardiovascular: Negative.  Negative for chest pain, palpitations, orthopnea and leg swelling.  Gastrointestinal: Negative.  Negative for abdominal pain, blood in stool, constipation, diarrhea, melena, nausea and vomiting.  Genitourinary: Negative.  Negative for dysuria, frequency, hematuria and urgency.  Musculoskeletal: Negative.  Negative for back pain, joint pain, myalgias  and neck pain.  Skin: Negative.  Negative for rash.  Neurological: Negative.  Negative for dizziness, tingling, sensory change, speech change, focal weakness, weakness and headaches.  Endo/Heme/Allergies: Negative.  Does not bruise/bleed easily.  Psychiatric/Behavioral: Negative for depression and memory loss. The patient is not nervous/anxious and does not have insomnia.   All other systems reviewed and are negative.  Performance status (ECOG): 1  Vitals Blood pressure (!) 139/91, pulse 86, temperature 97.6 F (36.4 C), temperature source Tympanic, resp. rate 18, height 5\' 7"  (1.702 m), weight 176 lb 9.4 oz (80.1 kg), SpO2 100 %.   Physical Exam  Constitutional: He is oriented to person, place, and time. He appears well-developed and well-nourished. No distress.  HENT:  Head: Normocephalic and atraumatic.  Mouth/Throat: Oropharynx is clear and moist. No oropharyngeal exudate.  Black hair. Mask.  No pain on jaw palpation or with opening and closing mouth.  Eyes: Pupils are equal, round, and reactive to light. Conjunctivae and EOM are normal. No scleral icterus.  Brown eyes.  Neck: Normal range of motion. Neck supple. No JVD present.  Cardiovascular: Normal rate, regular rhythm and normal heart sounds. Exam reveals no gallop.  No murmur heard. Pulmonary/Chest: Effort normal and breath sounds normal. No respiratory distress. He has no wheezes. He has no rales.  Abdominal: Soft. Bowel sounds are normal. He exhibits no mass. There is no abdominal tenderness. There is no rebound and no guarding.  Musculoskeletal: Normal range of motion.        General: No tenderness or edema.  Lymphadenopathy:    He has no cervical adenopathy.    He has no axillary adenopathy.       Right: No supraclavicular adenopathy present.       Left: No supraclavicular adenopathy present.  Neurological: He is alert and oriented to person, place, and time. He has normal reflexes.  Skin: Skin is warm and dry. No rash  noted. He is not diaphoretic. No erythema. No pallor.  Psychiatric: He has a normal mood and affect. His behavior is normal. Judgment and thought content normal.  Nursing note and vitals reviewed.   Appointment on 10/26/2018  Component Date Value Ref Range Status  . Free T4 10/26/2018 1.07  0.61 - 1.12 ng/dL Final   Comment: (NOTE) Biotin ingestion may interfere with free T4 tests. If the results are inconsistent with the TSH level, previous test results, or the clinical presentation, then consider biotin interference. If needed, order repeat testing after stopping biotin. Performed at [facility], [address], [place], [place] [address]   . TSH 10/26/2018 0.486  0.350 - 4.500 uIU/mL Final   Comment: Performed by a 3rd Generation assay with a functional sensitivity of <=0.01 uIU/mL. Performed at [facility], [address] [place], [place] [address]   . Sodium 10/26/2018 135  135 - 145 mmol/L Final  . Potassium 10/26/2018 3.8  3.5 - 5.1 mmol/L Final  . Chloride 10/26/2018 101  98 - 111 mmol/L Final  . CO2 10/26/2018 25  22 - 32 mmol/L  Final  . Glucose, Bld 10/26/2018 133* 70 - 99 mg/dL Final  . BUN 10/26/2018 17  8 - 23 mg/dL Final  . Creatinine, Ser 10/26/2018 1.99* 0.61 - 1.24 mg/dL Final  . Calcium 10/26/2018 8.8* 8.9 - 10.3 mg/dL Final  . Total Protein 10/26/2018 7.1  6.5 - 8.1 g/dL Final  . Albumin 10/26/2018 3.7  3.5 - 5.0 g/dL Final  . AST 10/26/2018 17  15 - 41 U/L Final  . ALT 10/26/2018 12  0 - 44 U/L Final  . Alkaline Phosphatase 10/26/2018 50  38 - 126 U/L Final  . Total Bilirubin 10/26/2018 0.7  0.3 - 1.2 mg/dL Final  . GFR calc non Af Amer 10/26/2018 34* >60 mL/min Final  . GFR calc Af Amer 10/26/2018 39* >60 mL/min Final  . Anion gap 10/26/2018 9  5 - 15 Final   Performed at [facility], [address] [place], [place] [address]  . WBC 10/26/2018 4.7  4.0 - 10.5 K/uL Final  . RBC 10/26/2018 5.18  4.22 - 5.81 MIL/uL Final   . Hemoglobin 10/26/2018 16.1  13.0 - 17.0 g/dL Final  . HCT 10/26/2018 48.4  39.0 - 52.0 % Final  . MCV 10/26/2018 93.4  80.0 - 100.0 fL Final  . MCH 10/26/2018 31.1  26.0 - 34.0 pg Final  . MCHC 10/26/2018 33.3  30.0 - 36.0 g/dL Final  . RDW 10/26/2018 13.0  11.5 - 15.5 % Final  . Platelets 10/26/2018 212  150 - 400 K/uL Final  . nRBC 10/26/2018 0.0  0.0 - 0.2 % Final  . Neutrophils Relative % 10/26/2018 53  % Final  . Neutro Abs 10/26/2018 2.5  1.7 - 7.7 K/uL Final  . Lymphocytes Relative 10/26/2018 29  % Final  . Lymphs Abs 10/26/2018 1.4  0.7 - 4.0 K/uL Final  . Monocytes Relative 10/26/2018 13  % Final  . Monocytes Absolute 10/26/2018 0.6  0.1 - 1.0 K/uL Final  . Eosinophils Relative 10/26/2018 3  % Final  . Eosinophils Absolute 10/26/2018 0.2  0.0 - 0.5 K/uL Final  . Basophils Relative 10/26/2018 1  % Final  . Basophils Absolute 10/26/2018 0.0  0.0 - 0.1 K/uL Final  . Immature Granulocytes 10/26/2018 1  % Final  . Abs Immature Granulocytes 10/26/2018 0.04  0.00 - 0.07 K/uL Final   Performed at [facility], [address] [place], [place] [address]  . Magnesium 10/26/2018 1.9  1.7 - 2.4 mg/dL Final   Performed at [facility], [address] [place],  [address]    Assessment:  VERLYN SAURMAN is a 68 y.o. male with recurrent base of tongue carcinoma. Clinical stagewasT3 N3 M0, p16+ in 11/2014. He received concurrent cisplatin and radiationfrom 03/2015 - 05/21/2015.   PET scanon 09/02/2015 was equivocal. PET scanon 12/07/2015 revealed abnormal FDG uptake (reactive from prior radiation therapy).   PET scanon 07/05/2016 revealed an enlarging level 2 lymph node (increasing SUV).   PET scanon 04/12/2017 revealed no evidence of hypermetabolic local recurrence in the oral cavity or metastatic disease within the neck, chest, abdomen or pelvis. There was no suspicious metabolic activity.   Biopsyin 07/2017 confirmed recurrence. He had  several delays in treatment but was scheduled to have surgery.  Unfortunately, he was found that recurrence was inoperable.   Based on KEYNOTE-048 trial, he initiated palliative Keytruda (pembrolizumab) every 3 weeks on 10/27/2016 (last 10/05/2018).  He will plan for 2 years of treatment any toxicity.  Neck and chest CTon 04/03/2018 revealed no evidence of residual or recurrent disease. He has chronic posttreatment changes of the tongue.  Chronic scarring and right parotid and right neck.  PET scan on 09/20/2018 was independently reviewed and I agree with radiologist findings which show no specific findings suggestive of local recurrence or distant metastatic disease. There was a new asymmetric focus of increased uptake posterior to left mandibular condyle.  Soft tissue neck MRI on 10/19/2018 revealed no anatomic explanation for the uptake posterior to the left mandibular condyle. There was no mass or metastatic disease identified by MRI.  He has renal insufficiency.Baseline creatininehas been1.62 - 1.81 in past year.Urinalysis on 07/04/2018 revealed no proteinuria or hematuria.  He has erythrocytosis. Hemoglobin was 16.7 on 04/06/2018. Work-upon 04/27/2018 revealed the followingnormal/negative tests: JAK2 V617F, exon 12-15, CALR, MPL, epo level (13.3). Carbon monoxide was 3.9% (0-3.6%)with repeat 3.0% on 07/04/2018. Testosteronewas normal on 07/04/2018. He does not smoke. No formal diagnosis of sleep apnea but symptomatic.  Encouraged him to discuss sleep testing with his PCP.  Symptomatically, he is doing well.  Exam is unremarkable.  Plan: 1.   Labs today: CBC with diff, CMP, TSH, free T4. 2.Recurrent base of tongue carcinoma Clinically, he is doing well.   Review interval PET scan (09/20/2018) and soft tissue neck MRI (10/19/2018).    Significance of focus of increased uptake in the left mandibular condyle unclear.  No abnormality seen on soft tissue MRI.  Exam is  unremarkable.  Review imaging with radiology. Re-review plan for 2 years of pembrolizumab based on the Keynote-048 trial Patient will complete last cycle of pembrolizumab today. Discuss plan for ongoing surveillance. 3.Renal insufficiency Patient has chronic renal insufficiency.  Nephrology work-up revealed no concern about pembrolizumab.  Baseline creatinine 1.62 - 1.81 in past year.  Creatinine was 2.12 on 07/04/2018 and1.99today. 4.Hypothyroidism Patient remains on levothyroxine.  TSH 0.486 and free T4 1.07 today. Continue to monitor. 5.Erythrocytosis, improved Adequate 48.4.  Hemoglobin 16.1. JAK2 with reflex, carbon monoxide, epo level, and testosterone were normal. Continue to monitor. 6.   Call patient after review of imaging with radiology. 7.   RTC in 10 weeks for MD assessment, labs (CBC with diff, CMP, TSH), and port flush.  I discussed the assessment and treatment plan with the patient.  The patient was provided an opportunity to ask questions and all were answered.  The patient agreed with the plan and demonstrated an understanding of the instructions.  The patient was advised to call back if the symptoms worsen or if the condition fails to improve as anticipated.   Lequita Asal, MD, PhD    10/26/2018, 3:35PM

## 2018-10-26 ENCOUNTER — Inpatient Hospital Stay: Payer: Medicare Other

## 2018-10-26 ENCOUNTER — Inpatient Hospital Stay: Payer: Medicare Other | Attending: Hematology and Oncology | Admitting: Hematology and Oncology

## 2018-10-26 ENCOUNTER — Other Ambulatory Visit: Payer: Self-pay

## 2018-10-26 ENCOUNTER — Encounter: Payer: Self-pay | Admitting: Hematology and Oncology

## 2018-10-26 VITALS — BP 139/91 | HR 86 | Temp 97.6°F | Resp 18 | Ht 67.0 in | Wt 176.6 lb

## 2018-10-26 VITALS — BP 128/85 | HR 78 | Resp 18

## 2018-10-26 DIAGNOSIS — R9402 Abnormal brain scan: Secondary | ICD-10-CM | POA: Insufficient documentation

## 2018-10-26 DIAGNOSIS — C01 Malignant neoplasm of base of tongue: Secondary | ICD-10-CM

## 2018-10-26 DIAGNOSIS — Z7982 Long term (current) use of aspirin: Secondary | ICD-10-CM | POA: Diagnosis not present

## 2018-10-26 DIAGNOSIS — D751 Secondary polycythemia: Secondary | ICD-10-CM | POA: Diagnosis not present

## 2018-10-26 DIAGNOSIS — E079 Disorder of thyroid, unspecified: Secondary | ICD-10-CM | POA: Insufficient documentation

## 2018-10-26 DIAGNOSIS — N189 Chronic kidney disease, unspecified: Secondary | ICD-10-CM | POA: Insufficient documentation

## 2018-10-26 DIAGNOSIS — Z5112 Encounter for antineoplastic immunotherapy: Secondary | ICD-10-CM

## 2018-10-26 DIAGNOSIS — I129 Hypertensive chronic kidney disease with stage 1 through stage 4 chronic kidney disease, or unspecified chronic kidney disease: Secondary | ICD-10-CM | POA: Insufficient documentation

## 2018-10-26 DIAGNOSIS — Z79899 Other long term (current) drug therapy: Secondary | ICD-10-CM | POA: Insufficient documentation

## 2018-10-26 DIAGNOSIS — K219 Gastro-esophageal reflux disease without esophagitis: Secondary | ICD-10-CM | POA: Insufficient documentation

## 2018-10-26 DIAGNOSIS — E039 Hypothyroidism, unspecified: Secondary | ICD-10-CM | POA: Insufficient documentation

## 2018-10-26 DIAGNOSIS — Z809 Family history of malignant neoplasm, unspecified: Secondary | ICD-10-CM | POA: Insufficient documentation

## 2018-10-26 DIAGNOSIS — I10 Essential (primary) hypertension: Secondary | ICD-10-CM | POA: Insufficient documentation

## 2018-10-26 LAB — CBC WITH DIFFERENTIAL/PLATELET
Abs Immature Granulocytes: 0.04 10*3/uL (ref 0.00–0.07)
Basophils Absolute: 0 10*3/uL (ref 0.0–0.1)
Basophils Relative: 1 %
Eosinophils Absolute: 0.2 10*3/uL (ref 0.0–0.5)
Eosinophils Relative: 3 %
HCT: 48.4 % (ref 39.0–52.0)
Hemoglobin: 16.1 g/dL (ref 13.0–17.0)
Immature Granulocytes: 1 %
Lymphocytes Relative: 29 %
Lymphs Abs: 1.4 10*3/uL (ref 0.7–4.0)
MCH: 31.1 pg (ref 26.0–34.0)
MCHC: 33.3 g/dL (ref 30.0–36.0)
MCV: 93.4 fL (ref 80.0–100.0)
Monocytes Absolute: 0.6 10*3/uL (ref 0.1–1.0)
Monocytes Relative: 13 %
Neutro Abs: 2.5 10*3/uL (ref 1.7–7.7)
Neutrophils Relative %: 53 %
Platelets: 212 10*3/uL (ref 150–400)
RBC: 5.18 MIL/uL (ref 4.22–5.81)
RDW: 13 % (ref 11.5–15.5)
WBC: 4.7 10*3/uL (ref 4.0–10.5)
nRBC: 0 % (ref 0.0–0.2)

## 2018-10-26 LAB — COMPREHENSIVE METABOLIC PANEL
ALT: 12 U/L (ref 0–44)
AST: 17 U/L (ref 15–41)
Albumin: 3.7 g/dL (ref 3.5–5.0)
Alkaline Phosphatase: 50 U/L (ref 38–126)
Anion gap: 9 (ref 5–15)
BUN: 17 mg/dL (ref 8–23)
CO2: 25 mmol/L (ref 22–32)
Calcium: 8.8 mg/dL — ABNORMAL LOW (ref 8.9–10.3)
Chloride: 101 mmol/L (ref 98–111)
Creatinine, Ser: 1.99 mg/dL — ABNORMAL HIGH (ref 0.61–1.24)
GFR calc Af Amer: 39 mL/min — ABNORMAL LOW (ref >60)
GFR calc non Af Amer: 34 mL/min — ABNORMAL LOW (ref >60)
Glucose, Bld: 133 mg/dL — ABNORMAL HIGH (ref 70–99)
Potassium: 3.8 mmol/L (ref 3.5–5.1)
Sodium: 135 mmol/L (ref 135–145)
Total Bilirubin: 0.7 mg/dL (ref 0.3–1.2)
Total Protein: 7.1 g/dL (ref 6.5–8.1)

## 2018-10-26 LAB — MAGNESIUM: Magnesium: 1.9 mg/dL (ref 1.7–2.4)

## 2018-10-26 LAB — TSH: TSH: 0.486 u[IU]/mL (ref 0.350–4.500)

## 2018-10-26 LAB — T4, FREE: Free T4: 1.07 ng/dL (ref 0.61–1.12)

## 2018-10-26 MED ORDER — SODIUM CHLORIDE 0.9 % IV SOLN
200.0000 mg | Freq: Once | INTRAVENOUS | Status: AC
  Administered 2018-10-26: 200 mg via INTRAVENOUS
  Filled 2018-10-26: qty 8

## 2018-10-26 MED ORDER — SODIUM CHLORIDE 0.9 % IV SOLN
Freq: Once | INTRAVENOUS | Status: AC
  Administered 2018-10-26: 11:00:00 via INTRAVENOUS
  Filled 2018-10-26: qty 250

## 2018-10-26 NOTE — Progress Notes (Signed)
No new changes noted today 

## 2018-10-26 NOTE — Patient Instructions (Signed)
Pembrolizumab injection What is this medicine? PEMBROLIZUMAB (pem broe liz ue mab) is a monoclonal antibody. It is used to treat bladder cancer, cervical cancer, endometrial cancer, esophageal cancer, head and neck cancer, hepatocellular cancer, Hodgkin lymphoma, kidney cancer, lymphoma, melanoma, Merkel cell carcinoma, lung cancer, stomach cancer, urothelial cancer, and cancers that have a certain genetic condition. This medicine may be used for other purposes; ask your health care provider or pharmacist if you have questions. COMMON BRAND NAME(S): Keytruda What should I tell my health care provider before I take this medicine? They need to know if you have any of these conditions:  diabetes  immune system problems  inflammatory bowel disease  liver disease  lung or breathing disease  lupus  received or scheduled to receive an organ transplant or a stem-cell transplant that uses donor stem cells  an unusual or allergic reaction to pembrolizumab, other medicines, foods, dyes, or preservatives  pregnant or trying to get pregnant  breast-feeding How should I use this medicine? This medicine is for infusion into a vein. It is given by a health care professional in a hospital or clinic setting. A special MedGuide will be given to you before each treatment. Be sure to read this information carefully each time. Talk to your pediatrician regarding the use of this medicine in children. While this drug may be prescribed for selected conditions, precautions do apply. Overdosage: If you think you have taken too much of this medicine contact a poison control center or emergency room at once. NOTE: This medicine is only for you. Do not share this medicine with others. What if I miss a dose? It is important not to miss your dose. Call your doctor or health care professional if you are unable to keep an appointment. What may interact with this medicine? Interactions have not been studied. Give  your health care provider a list of all the medicines, herbs, non-prescription drugs, or dietary supplements you use. Also tell them if you smoke, drink alcohol, or use illegal drugs. Some items may interact with your medicine. This list may not describe all possible interactions. Give your health care provider a list of all the medicines, herbs, non-prescription drugs, or dietary supplements you use. Also tell them if you smoke, drink alcohol, or use illegal drugs. Some items may interact with your medicine. What should I watch for while using this medicine? Your condition will be monitored carefully while you are receiving this medicine. You may need blood work done while you are taking this medicine. Do not become pregnant while taking this medicine or for 4 months after stopping it. Women should inform their doctor if they wish to become pregnant or think they might be pregnant. There is a potential for serious side effects to an unborn child. Talk to your health care professional or pharmacist for more information. Do not breast-feed an infant while taking this medicine or for 4 months after the last dose. What side effects may I notice from receiving this medicine? Side effects that you should report to your doctor or health care professional as soon as possible:  allergic reactions like skin rash, itching or hives, swelling of the face, lips, or tongue  bloody or black, tarry  breathing problems  changes in vision  chest pain  chills  confusion  constipation  cough  diarrhea  dizziness or feeling faint or lightheaded  fast or irregular heartbeat  fever  flushing  hair loss  joint pain  low blood counts - this   medicine may decrease the number of white blood cells, red blood cells and platelets. You may be at increased risk for infections and bleeding.  muscle pain  muscle weakness  persistent headache  redness, blistering, peeling or loosening of the skin,  including inside the mouth  signs and symptoms of high blood sugar such as dizziness; dry mouth; dry skin; fruity breath; nausea; stomach pain; increased hunger or thirst; increased urination  signs and symptoms of kidney injury like trouble passing urine or change in the amount of urine  signs and symptoms of liver injury like dark urine, light-colored stools, loss of appetite, nausea, right upper belly pain, yellowing of the eyes or skin  sweating  swollen lymph nodes  weight loss Side effects that usually do not require medical attention (report to your doctor or health care professional if they continue or are bothersome):  decreased appetite  muscle pain  tiredness This list may not describe all possible side effects. Call your doctor for medical advice about side effects. You may report side effects to FDA at 1-800-FDA-1088. Where should I keep my medicine? This drug is given in a hospital or clinic and will not be stored at home. NOTE: This sheet is a summary. It may not cover all possible information. If you have questions about this medicine, talk to your doctor, pharmacist, or health care provider.  2020 Elsevier/Gold Standard (2018-02-27 13:46:58)  

## 2018-11-06 ENCOUNTER — Inpatient Hospital Stay: Payer: Medicare Other

## 2018-11-06 ENCOUNTER — Inpatient Hospital Stay: Payer: Medicare Other | Admitting: Hematology and Oncology

## 2018-12-31 DIAGNOSIS — D751 Secondary polycythemia: Secondary | ICD-10-CM | POA: Diagnosis not present

## 2018-12-31 DIAGNOSIS — I10 Essential (primary) hypertension: Secondary | ICD-10-CM | POA: Diagnosis not present

## 2018-12-31 DIAGNOSIS — N1832 Chronic kidney disease, stage 3b: Secondary | ICD-10-CM | POA: Diagnosis not present

## 2019-01-01 NOTE — Progress Notes (Signed)
[facility]  [address] [place], [place] [address] Phone: [phone]  Fax: [phone]   Clinic Day:  01/03/2019  Referring physician: Juline Patch, MD  Chief Complaint: Troy BONWELL is a 68 y.o. 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  HPI: The patient was last seen in the medical oncology clinic on 10/26/2018. At that time, he was doing well. Exam was unremarkable. Hemoglobin 16.1. Creatinine was 1.99. TSH 0.486. Free T4 1.07.  Patient received his last cycle of pembrolizumab. Patient remained on levothyroxine.   During the interim, the patient felt "all right". He denies any jaw pain. His dry mouth is improving. He notes that he is not taking any calcium. I advised him to take oral calcium.   Patient is living with his sister. He would like to have his own place. He would like a paper to be signed so a family member could come assist him at home. He is worried he can fall down the stairs at his sister's house.   Past Medical History:  Diagnosis Date  . Cancer ([place])   . GERD (gastroesophageal reflux disease)   . Hypertension   . Thyroid disease     Past Surgical History:  Procedure Laterality Date  . tumor removed      Family History  Problem Relation Age of Onset  . Cancer Mother   . Cancer Sister   . Cancer Maternal Aunt   . Cancer Paternal Uncle     Social History:  reports that he has never smoked. He has never used smokeless tobacco. He reports that he does not drink alcohol or use drugs. He lives in [place].He has been helping his sister with yard work.He is currently staying with his sister. The patient is alone today.  Allergies: No Known Allergies  Current Medications: Current Outpatient Medications  Medication Sig Dispense Refill  . acetaminophen (TYLENOL) 650 MG CR tablet Take 1,300 mg by mouth every 8 (eight) hours.    Marland Kitchen amLODipine (NORVASC) 5 MG  tablet Take 5 mg by mouth daily.     Marland Kitchen aspirin EC 81 MG tablet Take 81 mg by mouth daily.    Marland Kitchen esomeprazole (NEXIUM) 40 MG capsule Take 1 capsule (40 mg total) by mouth daily. 30 capsule 5  . levothyroxine (SYNTHROID, LEVOTHROID) 125 MCG tablet Take 1 tablet (125 mcg total) by mouth daily. 30 tablet 5  . lisinopril-hydrochlorothiazide (ZESTORETIC) 10-12.5 MG tablet Take 1 tablet by mouth daily. 30 tablet 5  . sildenafil (VIAGRA) 100 MG tablet Take 0.5-1 tablets (50-100 mg total) by mouth daily as needed for erectile dysfunction. 5 tablet 11   No current facility-administered medications for this visit.     Review of Systems  Constitutional: Positive for weight loss (1 pound; fluctuates). Negative for chills and malaise/fatigue.       Feels "alright".  HENT: Negative.  Negative for congestion, ear discharge, ear pain, hearing loss, nosebleeds, sinus pain and sore throat.        No jaw pain. Dry mouth is improving.  Eyes: Negative.  Negative for blurred vision, double vision and photophobia.  Respiratory: Negative.  Negative for cough, hemoptysis, sputum production and shortness of breath.   Cardiovascular: Negative.  Negative for chest pain, palpitations, orthopnea and leg swelling.  Gastrointestinal: Negative.  Negative for abdominal pain, blood in stool, constipation, diarrhea, melena, nausea and vomiting.  Genitourinary: Negative.  Negative for dysuria,  frequency, hematuria and urgency.  Musculoskeletal: Negative.  Negative for back pain, joint pain, myalgias and neck pain.  Skin: Negative.  Negative for rash.  Neurological: Negative.  Negative for dizziness, tingling, sensory change, speech change, focal weakness, weakness and headaches.  Endo/Heme/Allergies: Negative.  Does not bruise/bleed easily.  Psychiatric/Behavioral: Negative.  Negative for depression and memory loss. The patient is not nervous/anxious and does not have insomnia.   All other systems reviewed and are negative.   Performance status (ECOG): 1  Vitals Blood pressure (!) 146/87, pulse 84, temperature (!) 97 F (36.1 C), temperature source Tympanic, resp. rate 16, weight 175 lb 14.8 oz (79.8 kg).   Physical Exam  Constitutional: He is oriented to person, place, and time. He appears well-developed and well-nourished. No distress.  HENT:  Head: Normocephalic and atraumatic.  Mouth/Throat: Oropharynx is clear and moist. No oropharyngeal exudate.  Black hair.  Mustache.  Mask.  No pain on jaw palpation or with opening and closing mouth.  Eyes: Pupils are equal, round, and reactive to light. Conjunctivae and EOM are normal. No scleral icterus.  Brown eyes.  Neck: Normal range of motion. Neck supple. No JVD present.  Cardiovascular: Normal rate, regular rhythm and normal heart sounds. Exam reveals no gallop.  No murmur heard. Pulmonary/Chest: Effort normal and breath sounds normal. No respiratory distress. He has no wheezes. He has no rales.  Abdominal: Soft. Bowel sounds are normal. He exhibits no distension and no mass. There is no abdominal tenderness. There is no rebound and no guarding.  Musculoskeletal: Normal range of motion.        General: No tenderness or edema.  Lymphadenopathy:       Head (right side): No preauricular, no posterior auricular and no occipital adenopathy present.       Head (left side): No preauricular, no posterior auricular and no occipital adenopathy present.    He has no cervical adenopathy.    He has no axillary adenopathy.       Right: No inguinal and no supraclavicular adenopathy present.       Left: No inguinal and no supraclavicular adenopathy present.  Neurological: He is alert and oriented to person, place, and time. He has normal reflexes.  Skin: Skin is warm and dry. No rash noted. He is not diaphoretic. No erythema. No pallor.  Psychiatric: He has a normal mood and affect. His behavior is normal. Judgment and thought content normal.  Nursing note and vitals  reviewed.   Appointment on 01/03/2019  Component Date Value Ref Range Status  . Sodium 01/03/2019 135  135 - 145 mmol/L Final  . Potassium 01/03/2019 3.7  3.5 - 5.1 mmol/L Final  . Chloride 01/03/2019 101  98 - 111 mmol/L Final  . CO2 01/03/2019 28  22 - 32 mmol/L Final  . Glucose, Bld 01/03/2019 118* 70 - 99 mg/dL Final  . BUN 01/03/2019 14  8 - 23 mg/dL Final  . Creatinine, Ser 01/03/2019 1.74* 0.61 - 1.24 mg/dL Final  . Calcium 01/03/2019 8.4* 8.9 - 10.3 mg/dL Final  . Total Protein 01/03/2019 7.0  6.5 - 8.1 g/dL Final  . Albumin 01/03/2019 3.7  3.5 - 5.0 g/dL Final  . AST 01/03/2019 19  15 - 41 U/L Final  . ALT 01/03/2019 12  0 - 44 U/L Final  . Alkaline Phosphatase 01/03/2019 44  38 - 126 U/L Final  . Total Bilirubin 01/03/2019 0.8  0.3 - 1.2 mg/dL Final  . GFR calc non Af Amer 01/03/2019  39* >60 mL/min Final  . GFR calc Af Amer 01/03/2019 46* >60 mL/min Final  . Anion gap 01/03/2019 6  5 - 15 Final   Performed at [facility], [address], [place], [place] [address]  . WBC 01/03/2019 5.4  4.0 - 10.5 K/uL Final  . RBC 01/03/2019 5.06  4.22 - 5.81 MIL/uL Final  . Hemoglobin 01/03/2019 15.7  13.0 - 17.0 g/dL Final  . HCT 01/03/2019 46.9  39.0 - 52.0 % Final  . MCV 01/03/2019 92.7  80.0 - 100.0 fL Final  . MCH 01/03/2019 31.0  26.0 - 34.0 pg Final  . MCHC 01/03/2019 33.5  30.0 - 36.0 g/dL Final  . RDW 01/03/2019 12.1  11.5 - 15.5 % Final  . Platelets 01/03/2019 202  150 - 400 K/uL Final  . nRBC 01/03/2019 0.0  0.0 - 0.2 % Final  . Neutrophils Relative % 01/03/2019 62  % Final  . Neutro Abs 01/03/2019 3.3  1.7 - 7.7 K/uL Final  . Lymphocytes Relative 01/03/2019 21  % Final  . Lymphs Abs 01/03/2019 1.2  0.7 - 4.0 K/uL Final  . Monocytes Relative 01/03/2019 14  % Final  . Monocytes Absolute 01/03/2019 0.7  0.1 - 1.0 K/uL Final  . Eosinophils Relative 01/03/2019 2  % Final  . Eosinophils Absolute 01/03/2019 0.1  0.0 - 0.5 K/uL Final  . Basophils Relative  01/03/2019 1  % Final  . Basophils Absolute 01/03/2019 0.1  0.0 - 0.1 K/uL Final  . Immature Granulocytes 01/03/2019 0  % Final  . Abs Immature Granulocytes 01/03/2019 0.02  0.00 - 0.07 K/uL Final   Performed at [facility], [address] [place], [place] [address]    Assessment:  RHIAN [name] is a 68 y.o. male with recurrent base of tongue carcinoma. Clinical stagewasT3 N3 M0, p16+ in 11/2014. He received concurrent cisplatin and radiationfrom 03/2015 - 05/21/2015.   PET scanon 09/02/2015 was equivocal. PET scanon 12/07/2015 revealed abnormal FDG uptake (reactive from prior radiation therapy).   PET scanon 07/05/2016 revealed an enlarging level 2 lymph node (increasing SUV).   PET scanon 04/12/2017 revealed no evidence of hypermetabolic local recurrence in the oral cavity or metastatic disease within the neck, chest, abdomen or pelvis. There was no suspicious metabolic activity.   Biopsyin 07/2017 confirmed recurrence. He had several delays in treatment but was scheduled to have surgery. Unfortunately, he was found that recurrence was inoperable.   Based onKEYNOTE-048 trial,he initiated palliative Keytruda (pembrolizumab)every 3 weeks on 10/27/2016 (last 10/26/2018).He will plan for 2 years of treatment any toxicity.  Neck and chest CTon 04/03/2018 revealed no evidence of residual or recurrent disease. He has chronic posttreatment changes of the tongue. Chronic scarring and right parotid and right neck.  PET scanon 08/06/2020was independently reviewed and I agree with radiologist findings which showno specific findings suggestive of local recurrence or distant metastatic disease. There was a new asymmetric focus of increased uptake posterior to left mandibular condyle. Soft tissue neck MRI on 10/19/2018 revealed no anatomic explanation for the uptake posterior to the left mandibular condyle. There was no mass or metastatic disease identified  by MRI.  He has renal insufficiency.Baseline creatininehas been1.62 - 1.81 in past year.Urinalysis on 07/04/2018 revealed no proteinuria or hematuria.  He has erythrocytosis. Hemoglobin was 16.7 on 04/06/2018. Work-upon 04/27/2018 revealed the followingnormal/negative tests: JAK2 V617F, exon 12-15, CALR, MPL, epo level (13.3). Carbon monoxide was 3.9% (0-3.6%)with repeat 3.0% on 07/04/2018. Testosteronewas normal on 07/04/2018. He  does not smoke. No formal diagnosis of sleep apnea but symptomatic. Encouraged him to discuss sleep testing with his PCP.  Symptomatically, he is doing well.  Exam is stable.  Plan: 1.   Labs today: CBC with diff, CMP, TSH. 2.Recurrent base of tongue carcinoma Clinically, he continues to do well.   PET scan on 09/20/2018 and soft tissue neck MRI on 10/19/2018 revealed:             Focus of increased uptake in the left mandibular condyle (unclear significance).             No abnormality seen on soft tissue MRI.             Exam remains unremarkable.             Continue to monitor. Patient completed 2 years of pembrolizumab based on the Keynote-048 trial Patient will complete last cycle of pembrolizumab on 10/26/2018. Continue surveillance. 3.Renal insufficiency Patient has chronic renal insufficiency.  Nephrology work-up revealed no concern about pembrolizumab.             Baseline creatinine 1.62 - 1.81 in past year.             Creatinine is 1.74 today.  Continue to monitor. 4.Hypothyroidism Patient remains on levothyroxine.  TSH 0.402.  Free T4 was 1.07 on 10/26/2018. Continue to monitor. 5.Erythrocytosis, improved Hematocrit 46.9.  Hemoglobin 15.7. JAK2 with reflex, carbon monoxide, epo level, and testosterone were normal. Continue to monitor. 6.Hypocalcemia  Calcium 8.4 (corrected calcium 8.655).  Discuss supplemental calcium. 7.   RN to talk to patient about housing (social work). 8.    RTC in 1 month for labs (BMP). 9.   RTC in 3 months for MD assessment, labs (CBC with diff, CMP, TSH).  I discussed the assessment and treatment plan with the patient.  The patient was provided an opportunity to ask questions and all were answered.  The patient agreed with the plan and demonstrated an understanding of the instructions.  The patient was advised to call back if the symptoms worsen or if the condition fails to improve as anticipated.   Lequita Asal, MD, PhD    01/03/2019, 9:50 AM  I, Selena Batten, am acting as scribe for [employer]. Mike Gip, MD, PhD.  I, Melissa C. Mike Gip, MD, have reviewed the above documentation for accuracy and completeness, and I agree with the above.

## 2019-01-02 ENCOUNTER — Other Ambulatory Visit: Payer: Self-pay

## 2019-01-03 ENCOUNTER — Inpatient Hospital Stay: Payer: Medicare Other

## 2019-01-03 ENCOUNTER — Inpatient Hospital Stay: Payer: Medicare Other | Attending: Hematology and Oncology | Admitting: Hematology and Oncology

## 2019-01-03 ENCOUNTER — Encounter: Payer: Self-pay | Admitting: Hematology and Oncology

## 2019-01-03 VITALS — BP 146/87 | HR 84 | Temp 97.0°F | Resp 16 | Wt 175.9 lb

## 2019-01-03 DIAGNOSIS — C01 Malignant neoplasm of base of tongue: Secondary | ICD-10-CM

## 2019-01-03 DIAGNOSIS — N289 Disorder of kidney and ureter, unspecified: Secondary | ICD-10-CM

## 2019-01-03 DIAGNOSIS — E039 Hypothyroidism, unspecified: Secondary | ICD-10-CM | POA: Diagnosis not present

## 2019-01-03 DIAGNOSIS — D751 Secondary polycythemia: Secondary | ICD-10-CM | POA: Insufficient documentation

## 2019-01-03 DIAGNOSIS — I129 Hypertensive chronic kidney disease with stage 1 through stage 4 chronic kidney disease, or unspecified chronic kidney disease: Secondary | ICD-10-CM | POA: Diagnosis not present

## 2019-01-03 DIAGNOSIS — Z79899 Other long term (current) drug therapy: Secondary | ICD-10-CM | POA: Insufficient documentation

## 2019-01-03 DIAGNOSIS — N189 Chronic kidney disease, unspecified: Secondary | ICD-10-CM | POA: Diagnosis not present

## 2019-01-03 DIAGNOSIS — E079 Disorder of thyroid, unspecified: Secondary | ICD-10-CM | POA: Insufficient documentation

## 2019-01-03 DIAGNOSIS — I10 Essential (primary) hypertension: Secondary | ICD-10-CM | POA: Insufficient documentation

## 2019-01-03 DIAGNOSIS — K219 Gastro-esophageal reflux disease without esophagitis: Secondary | ICD-10-CM | POA: Insufficient documentation

## 2019-01-03 LAB — CBC WITH DIFFERENTIAL/PLATELET
Abs Immature Granulocytes: 0.02 10*3/uL (ref 0.00–0.07)
Basophils Absolute: 0.1 10*3/uL (ref 0.0–0.1)
Basophils Relative: 1 %
Eosinophils Absolute: 0.1 10*3/uL (ref 0.0–0.5)
Eosinophils Relative: 2 %
HCT: 46.9 % (ref 39.0–52.0)
Hemoglobin: 15.7 g/dL (ref 13.0–17.0)
Immature Granulocytes: 0 %
Lymphocytes Relative: 21 %
Lymphs Abs: 1.2 10*3/uL (ref 0.7–4.0)
MCH: 31 pg (ref 26.0–34.0)
MCHC: 33.5 g/dL (ref 30.0–36.0)
MCV: 92.7 fL (ref 80.0–100.0)
Monocytes Absolute: 0.7 10*3/uL (ref 0.1–1.0)
Monocytes Relative: 14 %
Neutro Abs: 3.3 10*3/uL (ref 1.7–7.7)
Neutrophils Relative %: 62 %
Platelets: 202 10*3/uL (ref 150–400)
RBC: 5.06 MIL/uL (ref 4.22–5.81)
RDW: 12.1 % (ref 11.5–15.5)
WBC: 5.4 10*3/uL (ref 4.0–10.5)
nRBC: 0 % (ref 0.0–0.2)

## 2019-01-03 LAB — COMPREHENSIVE METABOLIC PANEL
ALT: 12 U/L (ref 0–44)
AST: 19 U/L (ref 15–41)
Albumin: 3.7 g/dL (ref 3.5–5.0)
Alkaline Phosphatase: 44 U/L (ref 38–126)
Anion gap: 6 (ref 5–15)
BUN: 14 mg/dL (ref 8–23)
CO2: 28 mmol/L (ref 22–32)
Calcium: 8.4 mg/dL — ABNORMAL LOW (ref 8.9–10.3)
Chloride: 101 mmol/L (ref 98–111)
Creatinine, Ser: 1.74 mg/dL — ABNORMAL HIGH (ref 0.61–1.24)
GFR calc Af Amer: 46 mL/min — ABNORMAL LOW (ref >60)
GFR calc non Af Amer: 39 mL/min — ABNORMAL LOW (ref >60)
Glucose, Bld: 118 mg/dL — ABNORMAL HIGH (ref 70–99)
Potassium: 3.7 mmol/L (ref 3.5–5.1)
Sodium: 135 mmol/L (ref 135–145)
Total Bilirubin: 0.8 mg/dL (ref 0.3–1.2)
Total Protein: 7 g/dL (ref 6.5–8.1)

## 2019-01-03 LAB — TSH: TSH: 0.402 u[IU]/mL (ref 0.350–4.500)

## 2019-01-03 NOTE — Progress Notes (Signed)
Patient here for follow up. Denies any concerns.  

## 2019-01-12 ENCOUNTER — Other Ambulatory Visit: Payer: Self-pay | Admitting: Family Medicine

## 2019-01-12 DIAGNOSIS — K219 Gastro-esophageal reflux disease without esophagitis: Secondary | ICD-10-CM

## 2019-02-01 ENCOUNTER — Other Ambulatory Visit: Payer: Self-pay

## 2019-02-04 ENCOUNTER — Other Ambulatory Visit: Payer: Self-pay

## 2019-02-04 ENCOUNTER — Inpatient Hospital Stay: Payer: Medicare Other | Attending: Hematology and Oncology

## 2019-02-04 DIAGNOSIS — C01 Malignant neoplasm of base of tongue: Secondary | ICD-10-CM

## 2019-02-04 DIAGNOSIS — D751 Secondary polycythemia: Secondary | ICD-10-CM

## 2019-02-04 DIAGNOSIS — N289 Disorder of kidney and ureter, unspecified: Secondary | ICD-10-CM

## 2019-02-04 DIAGNOSIS — E039 Hypothyroidism, unspecified: Secondary | ICD-10-CM

## 2019-02-04 LAB — BASIC METABOLIC PANEL
Anion gap: 6 (ref 5–15)
BUN: 18 mg/dL (ref 8–23)
CO2: 26 mmol/L (ref 22–32)
Calcium: 8.3 mg/dL — ABNORMAL LOW (ref 8.9–10.3)
Chloride: 102 mmol/L (ref 98–111)
Creatinine, Ser: 1.68 mg/dL — ABNORMAL HIGH (ref 0.61–1.24)
GFR calc Af Amer: 48 mL/min — ABNORMAL LOW (ref >60)
GFR calc non Af Amer: 41 mL/min — ABNORMAL LOW (ref >60)
Glucose, Bld: 108 mg/dL — ABNORMAL HIGH (ref 70–99)
Potassium: 3.4 mmol/L — ABNORMAL LOW (ref 3.5–5.1)
Sodium: 134 mmol/L — ABNORMAL LOW (ref 135–145)

## 2019-02-14 ENCOUNTER — Ambulatory Visit (HOSPITAL_COMMUNITY): Admission: RE | Admit: 2019-02-14 | Payer: 59 | Source: Ambulatory Visit

## 2019-03-04 ENCOUNTER — Other Ambulatory Visit: Payer: Self-pay | Admitting: Family Medicine

## 2019-03-04 DIAGNOSIS — K219 Gastro-esophageal reflux disease without esophagitis: Secondary | ICD-10-CM

## 2019-03-05 ENCOUNTER — Encounter: Payer: Self-pay | Admitting: Family Medicine

## 2019-03-05 ENCOUNTER — Other Ambulatory Visit: Payer: Self-pay

## 2019-03-05 ENCOUNTER — Ambulatory Visit (INDEPENDENT_AMBULATORY_CARE_PROVIDER_SITE_OTHER): Payer: Medicare Other | Admitting: Family Medicine

## 2019-03-05 VITALS — BP 130/64 | HR 72 | Ht 67.0 in | Wt 173.0 lb

## 2019-03-05 DIAGNOSIS — E890 Postprocedural hypothyroidism: Secondary | ICD-10-CM

## 2019-03-05 DIAGNOSIS — K219 Gastro-esophageal reflux disease without esophagitis: Secondary | ICD-10-CM

## 2019-03-05 DIAGNOSIS — I10 Essential (primary) hypertension: Secondary | ICD-10-CM

## 2019-03-05 DIAGNOSIS — N5201 Erectile dysfunction due to arterial insufficiency: Secondary | ICD-10-CM

## 2019-03-05 DIAGNOSIS — Z23 Encounter for immunization: Secondary | ICD-10-CM | POA: Diagnosis not present

## 2019-03-05 MED ORDER — LEVOTHYROXINE SODIUM 125 MCG PO TABS: 125.0000 ug | ORAL_TABLET | Freq: Every day | ORAL | 1 refills | Status: DC

## 2019-03-05 MED ORDER — SILDENAFIL CITRATE 100 MG PO TABS: 50.0000 mg | ORAL_TABLET | Freq: Every day | ORAL | 11 refills | Status: DC | PRN

## 2019-03-05 MED ORDER — LISINOPRIL-HYDROCHLOROTHIAZIDE 10-12.5 MG PO TABS: 1.0000 | ORAL_TABLET | Freq: Every day | ORAL | 1 refills | Status: DC

## 2019-03-05 MED ORDER — ESOMEPRAZOLE MAGNESIUM 40 MG PO CPDR: 40.0000 mg | DELAYED_RELEASE_CAPSULE | Freq: Every day | ORAL | 1 refills | Status: DC

## 2019-03-05 NOTE — Progress Notes (Signed)
Date:  03/05/2019   Name:  Troy [name]   DOB:  1950/12/15   MRN:  [id number]   Chief Complaint: Gastroesophageal Reflux, Hypertension, Hypothyroidism, Erectile Dysfunction, and Flu Vaccine  Gastroesophageal Reflux He reports no abdominal pain, no belching, no chest pain, no choking, no coughing, no dysphagia, no early satiety, no globus sensation, no heartburn, no hoarse voice, no nausea, no sore throat, no stridor, no tooth decay, no water brash or no wheezing. This is a chronic problem. The current episode started more than 1 year ago. The problem occurs rarely. The problem has been gradually improving. Nothing aggravates the symptoms. Pertinent negatives include no anemia, fatigue, melena, muscle weakness, orthopnea or weight loss. There are no known risk factors. He has tried a PPI for the symptoms. The treatment provided moderate relief. Past procedures include an EGD.  Hypertension This is a chronic problem. The current episode started more than 1 year ago. The problem has been gradually improving since onset. Pertinent negatives include no anxiety, blurred vision, chest pain, headaches, malaise/fatigue, neck pain, orthopnea, palpitations, peripheral edema, PND, shortness of breath or sweats. There are no associated agents to hypertension. Risk factors for coronary artery disease include dyslipidemia. Past treatments include ACE inhibitors and diuretics. The current treatment provides mild improvement. There are no compliance problems.  There is no history of angina, kidney disease, CAD/MI, CVA, heart failure, left ventricular hypertrophy, PVD or retinopathy. Identifiable causes of hypertension include a thyroid problem. There is no history of chronic renal disease, a hypertension causing med or renovascular disease.  Erectile Dysfunction This is a chronic problem. The current episode started more than 1 year ago. The problem has been gradually improving since onset. The nature of his  difficulty is achieving erection. He reports no anxiety. Irritative symptoms do not include frequency or urgency. Obstructive symptoms do not include dribbling, an intermittent stream, a slower stream, straining or a weak stream. Pertinent negatives include no chills, dysuria, hematuria, hesitancy or inability to urinate. Past treatments include sildenafil.  Thyroid Problem Presents for follow-up visit. Patient reports no anxiety, cold intolerance, constipation, depressed mood, diarrhea, fatigue, hair loss, heat intolerance, hoarse voice, palpitations, weight gain or weight loss. The symptoms have been stable. There is no history of heart failure.    Lab Results  Component Value Date   CREATININE 1.68 (H) 02/04/2019   BUN 18 02/04/2019   NA 134 (L) 02/04/2019   K 3.4 (L) 02/04/2019   CL 102 02/04/2019   CO2 26 02/04/2019   Lab Results  Component Value Date   CHOL 220 (H) 03/31/2017   HDL 58 03/31/2017   LDLCALC 144 (H) 03/31/2017   TRIG 90 03/31/2017   CHOLHDL 3.8 03/31/2017   Lab Results  Component Value Date   TSH 0.402 01/03/2019   No results found for: HGBA1C   Review of Systems  Constitutional: Negative for chills, fatigue, fever, malaise/fatigue, weight gain and weight loss.  HENT: Negative for drooling, ear discharge, ear pain, hearing loss, hoarse voice, mouth sores, nosebleeds, postnasal drip and sore throat.   Eyes: Negative for blurred vision.  Respiratory: Negative for cough, choking, shortness of breath and wheezing.   Cardiovascular: Negative for chest pain, palpitations, orthopnea, leg swelling and PND.  Gastrointestinal: Negative for abdominal pain, blood in stool, constipation, diarrhea, dysphagia, heartburn, melena and nausea.  Endocrine: Negative for cold intolerance, heat intolerance and polydipsia.  Genitourinary: Negative for dysuria, frequency, hematuria, hesitancy and urgency.  Musculoskeletal: Negative for back pain, myalgias, muscle  weakness and neck  pain.  Skin: Negative for rash.  Allergic/Immunologic: Negative for environmental allergies.  Neurological: Negative for dizziness and headaches.  Hematological: Does not bruise/bleed easily.  Psychiatric/Behavioral: Negative for suicidal ideas. The patient is not nervous/anxious.     Patient Active Problem List   Diagnosis Date Noted  . Hypocalcemia 01/03/2019  . Abnormal PET scan of head 10/26/2018  . Low TSH level 09/16/2018  . Renal insufficiency 07/09/2018  . Atherosclerosis of aorta ([place]) 06/27/2018  . Encounter for antineoplastic immunotherapy 05/18/2018  . Erythrocytosis 04/27/2018  . 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 Goals of care, counseling/discussion 04/07/2017  . CKD (chronic kidney disease) stage 3, GFR 30-59 ml/min 04/03/2017  . Hypothyroid 03/31/2017  . ED (erectile dysfunction) 03/31/2017  . GERD (gastroesophageal reflux disease) 09/23/2016  . Hypertension 09/23/2016  . Squamous cell carcinoma of base of tongue ([facility]) 08/04/2016    No Known Allergies  Past Surgical History:  Procedure Laterality Date  . tumor removed      Social History   Tobacco Use  . Smoking status: Never Smoker  . Smokeless tobacco: Never Used  Substance Use Topics  . Alcohol use: No  . Drug use: No     Medication list has been reviewed and updated.  Current Meds  Medication Sig  . acetaminophen (TYLENOL) 650 MG CR tablet Take 1,300 mg by mouth every 8 (eight) hours.  Marland Kitchen amLODipine (NORVASC) 5 MG tablet Take 5 mg by mouth daily.   Marland Kitchen aspirin EC 81 MG tablet Take 81 mg by mouth daily.  Marland Kitchen esomeprazole (NEXIUM) 40 MG capsule Take 1 capsule (40 mg total) by mouth daily.  Marland Kitchen levothyroxine (SYNTHROID, LEVOTHROID) 125 MCG tablet Take 1 tablet (125 mcg total) by mouth daily.  Marland Kitchen lisinopril-hydrochlorothiazide (ZESTORETIC) 10-12.5 MG tablet Take 1 tablet by mouth daily.  . sildenafil (VIAGRA) 100 MG tablet Take 0.5-1 tablets (50-100 mg total) by mouth daily as needed for  erectile dysfunction.    PHQ 2/9 Scores 03/05/2019 05/18/2018 03/31/2017  PHQ - 2 Score 0 0 0  PHQ- 9 Score 0 1 -    BP Readings from Last 3 Encounters:  03/05/19 130/64  01/03/19 (!) 146/87  10/26/18 128/85    Physical Exam Vitals and nursing note reviewed.  HENT:     Head: Normocephalic.     Right Ear: Tympanic membrane, ear canal and external ear normal.     Left Ear: Tympanic membrane, ear canal and external ear normal.     Nose: Nose normal. No congestion or rhinorrhea.     Mouth/Throat:     Mouth: Mucous membranes are moist.  Eyes:     General: No scleral icterus.       Right eye: No discharge.        Left eye: No discharge.     Extraocular Movements: Extraocular movements intact.     Conjunctiva/sclera: Conjunctivae normal.     Pupils: Pupils are equal, round, and reactive to light.  Neck:     Thyroid: No thyromegaly.     Vascular: No JVD.     Trachea: No tracheal deviation.  Cardiovascular:     Rate and Rhythm: Normal rate and regular rhythm.     Heart sounds: Normal heart sounds. No murmur. No friction rub. No gallop.   Pulmonary:     Effort: No respiratory distress.     Breath sounds: Normal breath sounds. No wheezing or rales.  Abdominal:     General: Abdomen is flat. Bowel  sounds are normal. There is no distension.     Palpations: Abdomen is soft. There is no mass.     Tenderness: There is no abdominal tenderness. There is no right CVA tenderness, left CVA tenderness, guarding or rebound.     Hernia: No hernia is present.  Musculoskeletal:        General: No tenderness. Normal range of motion.     Cervical back: Normal range of motion and neck supple.  Lymphadenopathy:     Cervical: No cervical adenopathy.  Skin:    General: Skin is warm.     Findings: No bruising, erythema or rash.  Neurological:     Mental Status: He is alert and oriented to person, place, and time.     Cranial Nerves: No cranial nerve deficit.     Deep Tendon Reflexes: Reflexes are  normal and symmetric.  Psychiatric:        Mood and Affect: Mood normal.     Wt Readings from Last 3 Encounters:  03/05/19 173 lb (78.5 kg)  01/03/19 175 lb 14.8 oz (79.8 kg)  10/26/18 176 lb 9.4 oz (80.1 kg)    BP 130/64   Pulse 72   Ht 5\' 7"  (1.702 m)   Wt 173 lb (78.5 kg)   BMI 27.10 kg/m   Assessment and Plan: 1. Erectile dysfunction due to arterial insufficiency Chronic.  Controlled.  Stable.  Patient wanting something stronger at home is probably the strongest that we can use and that there is no advantage to going to Cialis we will continue sildenafil 100 mg 1/2-1 as needed. - sildenafil (VIAGRA) 100 MG tablet; Take 0.5-1 tablets (50-100 mg total) by mouth daily as needed for erectile dysfunction.  Dispense: 5 tablet; Refill: 11  2. Essential hypertension Chronic.  Controlled.  Stable.  Continue lisinopril hydrochlorothiazide 10-12 0.5.  Reviewed previous labs from Dr. Mike Gip and we will use current labs for verification of GFR. - lisinopril-hydrochlorothiazide (ZESTORETIC) 10-12.5 MG tablet; Take 1 tablet by mouth daily.  Dispense: 90 tablet; Refill: 1  3. Gastroesophageal reflux disease Chronic.  Controlled.  Stable.  Continue Nexium 40 mg once a day. - esomeprazole (NEXIUM) 40 MG capsule; Take 1 capsule (40 mg total) by mouth daily.  Dispense: 90 capsule; Refill: 1  4. Postoperative hypothyroidism Chronic.  Controlled.  Stable.  Status post ablation of thyroid patient is currently stable on levothyroxine 125 mcg. - levothyroxine (SYNTHROID) 125 MCG tablet; Take 1 tablet (125 mcg total) by mouth daily.  Dispense: 90 tablet; Refill: 1  5. Influenza vaccine needed Discussed and administered - Flu Vaccine QUAD High Dose(Fluad)

## 2019-04-07 NOTE — Progress Notes (Signed)
[facility]  [address], [address] [place], [place] [address] Phone: [phone]  Fax: [phone]   Telephone Visit:  04/09/2019  Referring physician: Juline Patch, MD  I connected with Luanna Salk on 04/09/2019 at 10:31 AM by telephone and verified that I was speaking with the correct person using 2 identifiers.  The patient was at home.  I discussed the limitations, risk, security and privacy concerns of performing an evaluation and management service by telephone and the availability of in person appointments.  I also discussed with the patient that there may be a patient responsible charge related to this service.  The patient expressed understanding and agreed to proceed.   Chief Complaint: Troy [name] is a 69 y.o. 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   HPI: The patient was last seen in the medical oncology clinic on 01/03/2019. At that time, he was doing well. Exam was stable. Hematocrit was 46.9, hemoglobin 15.7, platelets 202,000, WBC 5,400. Creatinine was 1.74. Potassium 3.4.  Calcium 8.4. TSH was 0.402.  We discussed continuation or surveillance.   Labs on 04/08/2019 revealed a hematocrit 48.5, hemoglobin 15.9, platelets 237,000, WBC 6,400. Creatinine was 1.80.  Sodium 133, potassium 3.3, calcium 8.6. TSH 1.067.   During the interim, he has felt "great". His reports some weight gain. His dry mouth has improved. He denies any B symptoms. He does not have a follow up with ENT. He is being followed by his PCP for is low potassium. He has not picked up any potassium pills.   He denies any adenopathy around his neck. His needs two teeth extracted. He does not have a port. I assured him that it was okay to get his teeth extracted. I informed the patient to let his dentist know he has had radiation.    Past Medical History:  Diagnosis Date  . Cancer ([place])   . GERD (gastroesophageal  reflux disease)   . Hypertension   . Thyroid disease     Past Surgical History:  Procedure Laterality Date  . tumor removed      Family History  Problem Relation Age of Onset  . Cancer Mother   . Cancer Sister   . Cancer Maternal Aunt   . Cancer Paternal Uncle     Social History:  reports that he has never smoked. He has never used smokeless tobacco. He reports that he does not drink alcohol or use drugs.  He lives in [place].He has been helping his sister with yard work.He is currently staying with his sister The patient is accompanied by his sister today.  Participants in the patient's visit and their role in the encounter included the patient, his sister, and Vito Berger, CMA, today.  The intake visit was provided by Vito Berger, CMA.  Allergies: No Known Allergies  Current Medications: Current Outpatient Medications  Medication Sig Dispense Refill  . acetaminophen (TYLENOL) 650 MG CR tablet Take 1,300 mg by mouth every 8 (eight) hours.    Marland Kitchen amLODipine (NORVASC) 5 MG tablet Take 5 mg by mouth daily.     Marland Kitchen aspirin EC 81 MG tablet Take 81 mg by mouth daily.    Marland Kitchen esomeprazole (NEXIUM) 40 MG capsule Take 1 capsule (40 mg total) by mouth daily. 90 capsule 1  . levothyroxine (SYNTHROID) 125 MCG tablet Take 1 tablet (125 mcg total) by mouth daily. 90 tablet 1  . lisinopril-hydrochlorothiazide (ZESTORETIC)  10-12.5 MG tablet Take 1 tablet by mouth daily. 90 tablet 1  . sildenafil (VIAGRA) 100 MG tablet Take 0.5-1 tablets (50-100 mg total) by mouth daily as needed for erectile dysfunction. 5 tablet 11   No current facility-administered medications for this visit.     Review of Systems  Constitutional: Negative for chills, diaphoresis, fever, malaise/fatigue and weight loss (gaining).       Feels "great".  HENT: Negative.  Negative for congestion, ear discharge, ear pain, hearing loss, nosebleeds, sinus pain and sore throat.        No jaw pain. Dry mouth is improved.  Needs teeth extracted.  Eyes: Negative.  Negative for blurred vision, double vision and photophobia.  Respiratory: Negative.  Negative for cough, hemoptysis, sputum production and shortness of breath.   Cardiovascular: Negative.  Negative for chest pain, palpitations, orthopnea and leg swelling.  Gastrointestinal: Negative.  Negative for abdominal pain, blood in stool, constipation, diarrhea, melena, nausea and vomiting.  Genitourinary: Negative.  Negative for dysuria, frequency, hematuria and urgency.  Musculoskeletal: Negative.  Negative for back pain, joint pain, myalgias and neck pain.  Skin: Negative.  Negative for rash.  Neurological: Negative.  Negative for dizziness, tingling, sensory change, speech change, focal weakness, weakness and headaches.  Endo/Heme/Allergies: Environmental allergies:  Does not bruise/bleed easily.  Psychiatric/Behavioral: Negative.  Negative for depression and memory loss. The patient is not nervous/anxious and does not have insomnia.   All other systems reviewed and are negative.   Performance status (ECOG):  1   Appointment on 04/08/2019  Component Date Value Ref Range Status  . TSH 04/08/2019 1.067  0.350 - 4.500 uIU/mL Final   Comment: Performed by a 3rd Generation assay with a functional sensitivity of <=0.01 uIU/mL. Performed at [facility], [address] [place], [place] [address]   . Sodium 04/08/2019 133* 135 - 145 mmol/L Final  . Potassium 04/08/2019 3.3* 3.5 - 5.1 mmol/L Final  . Chloride 04/08/2019 100  98 - 111 mmol/L Final  . CO2 04/08/2019 26  22 - 32 mmol/L Final  . Glucose, Bld 04/08/2019 111* 70 - 99 mg/dL Final  . BUN 04/08/2019 19  8 - 23 mg/dL Final  . Creatinine, Ser 04/08/2019 1.80* 0.61 - 1.24 mg/dL Final  . Calcium 04/08/2019 8.6* 8.9 - 10.3 mg/dL Final  . Total Protein 04/08/2019 7.1  6.5 - 8.1 g/dL Final  . Albumin 04/08/2019 3.6  3.5 - 5.0 g/dL Final  . AST 04/08/2019 18  15 - 41 U/L Final  . ALT 04/08/2019 16   0 - 44 U/L Final  . Alkaline Phosphatase 04/08/2019 42  38 - 126 U/L Final  . Total Bilirubin 04/08/2019 1.1  0.3 - 1.2 mg/dL Final  . GFR calc non Af Amer 04/08/2019 38* >60 mL/min Final  . GFR calc Af Amer 04/08/2019 44* >60 mL/min Final  . Anion gap 04/08/2019 7  5 - 15 Final   Performed at [facility], [address] [place], [place] [address]  . WBC 04/08/2019 6.4  4.0 - 10.5 K/uL Final  . RBC 04/08/2019 5.32  4.22 - 5.81 MIL/uL Final  . Hemoglobin 04/08/2019 15.9  13.0 - 17.0 g/dL Final  . HCT 04/08/2019 48.5  39.0 - 52.0 % Final  . MCV 04/08/2019 91.2  80.0 - 100.0 fL Final  . MCH 04/08/2019 29.9  26.0 - 34.0 pg Final  . MCHC 04/08/2019 32.8  30.0 - 36.0 g/dL Final  . RDW 04/08/2019 13.3  11.5 -  15.5 % Final  . Platelets 04/08/2019 237  150 - 400 K/uL Final  . nRBC 04/08/2019 0.0  0.0 - 0.2 % Final  . Neutrophils Relative % 04/08/2019 56  % Final  . Neutro Abs 04/08/2019 3.7  1.7 - 7.7 K/uL Final  . Lymphocytes Relative 04/08/2019 26  % Final  . Lymphs Abs 04/08/2019 1.6  0.7 - 4.0 K/uL Final  . Monocytes Relative 04/08/2019 13  % Final  . Monocytes Absolute 04/08/2019 0.8  0.1 - 1.0 K/uL Final  . Eosinophils Relative 04/08/2019 3  % Final  . Eosinophils Absolute 04/08/2019 0.2  0.0 - 0.5 K/uL Final  . Basophils Relative 04/08/2019 1  % Final  . Basophils Absolute 04/08/2019 0.1  0.0 - 0.1 K/uL Final  . Immature Granulocytes 04/08/2019 1  % Final  . Abs Immature Granulocytes 04/08/2019 0.03  0.00 - 0.07 K/uL Final   Performed at [facility], [address] [place], [place] [address]    Assessment:  JEN CADOTTE is a 69 y.o. male with recurrent base of tongue carcinoma. Clinical stagewasT3 N3 M0, p16+ in 11/2014. He received concurrent cisplatin and radiationfrom 03/2015 - 05/21/2015.   PET scanon 09/02/2015 was equivocal. PET scanon 12/07/2015 revealed abnormal FDG uptake (reactive from prior radiation therapy).   PET scanon  07/05/2016 revealed an enlarging level 2 lymph node (increasing SUV).   PET scanon 04/12/2017 revealed no evidence of hypermetabolic local recurrence in the oral cavity or metastatic disease within the neck, chest, abdomen or pelvis. There was no suspicious metabolic activity.   Biopsyin 07/2017 confirmed recurrence. He had several delays in treatment but was scheduled to have surgery. Unfortunately, he was found that recurrence was inoperable.   Based onKEYNOTE-048 trial,he initiated palliative Keytruda (pembrolizumab)every 3 weeks on09/13/2018(last 10/26/2018).He will plan for 2 years of treatment any toxicity.  Neck and chest CTon 04/03/2018 revealed no evidence of residual or recurrent disease. He has chronic posttreatment changes of the tongue. Chronic scarring and right parotid and right neck.  PET scanon08/06/2020was independently reviewed and I agree with radiologist findings which showno specific findings suggestive of local recurrence or distant metastatic disease. There was a new asymmetric focus of increased uptake posterior to left mandibular condyle.Soft tissue neck MRIon 10/19/2018 revealed no anatomic explanation for the uptake posterior to the left mandibular condyle.There was nomass or metastatic disease identified by MRI.  He has renal insufficiency.Baseline creatininehas been1.62 - 1.81 in past year.Urinalysis on 07/04/2018 revealed no proteinuria or hematuria.  He has erythrocytosis. Hemoglobin was 16.7 on 04/06/2018. Work-upon 04/27/2018 revealed the followingnormal/negative tests: JAK2 V617F, exon 12-15, CALR, MPL, epo level (13.3). Carbon monoxide was 3.9% (0-3.6%)with repeat 3.0% on 07/04/2018. Testosteronewas normal on 07/04/2018. He does not smoke. No formal diagnosis of sleep apnea but symptomatic. Encouraged him to discuss sleep testing with his PCP.  Symptomatically, he is doing well.  He denies any concerns.  He has not  been on potassium supplementation.  Plan: 1.   Review labs from 04/08/2019. 2.Recurrent base of tongue carcinoma Clinically, he is doing well.  PETscanon 08/06/2020and soft tissue neck MRIon 10/19/2018 revealed: Focus of increased uptake in the left mandibular condyle (unclear significance). No abnormality seen on soft tissue MRI. Exam is unremarkable. Continue to monitor. He completed 2 years of pembrolizumab based on theKeynote-048 trial on 10/26/2018 Continue surveillance. 3.Renal insufficiency Patienthas chronic renal insufficiency.  Nephrology work-up revealed noconcern aboutpembrolizumab. Baseline creatinine 1.62 - 1.81 in past year. Creatinine is 1.80 today.  Continue to monitor. 4.Hypothyroidism Patient remains on levothyroxine.  TSH 1.067 today. Continue to monitor. 5.Erythrocytosis, improved Hematocrit 48.5. Hemoglobin 15.9. JAK2 with reflex, carbon monoxide, epo level, and testosterone were normal. Continue to monitor. 6.Hypocalcemia             Calcium 8.6 (corrected calcium 8.94).             Continue supplemental calcium. 7.   Hypokalemia  Potassium 3.3.  Patient has chronic hypokalemia secondary to lisinopril-HCTZ.  Patient to contact PCP.  Labs sent. 8.   Schedule follow-up ENT evaluation for ongoing surveillance 9.   RTC in 3 months for MD assessment (in person) and labs (CBC with diff, CMP, TSH).  I discussed the assessment and treatment plan with the patient.  The patient was provided an opportunity to ask questions and all were answered.  The patient agreed with the plan and demonstrated an understanding of the instructions.  The patient was advised to call back or seek an in person evaluation if the symptoms worsen or if the condition fails to improve as anticipated.  I provided 16 minutes (10:25 AM - 10:41 AM) of non  face-to-face telephone time during this encounter and > 50% was spent counseling as documented under my assessment and plan.  I provided these services from the [facility] office.   Nolon Stalls, MD, PhD  04/09/2019, 10:31 AM  I, Selena Batten, am acting as scribe for [employer]. Mike Gip, MD, PhD.  I, Melissa C. Mike Gip, MD, have reviewed the above documentation for accuracy and completeness, and I agree with the above.

## 2019-04-08 ENCOUNTER — Other Ambulatory Visit: Payer: Self-pay

## 2019-04-08 ENCOUNTER — Inpatient Hospital Stay: Payer: Medicare Other | Attending: Hematology and Oncology

## 2019-04-08 ENCOUNTER — Ambulatory Visit: Payer: Medicare Other | Admitting: Hematology and Oncology

## 2019-04-08 ENCOUNTER — Telehealth: Payer: Self-pay

## 2019-04-08 DIAGNOSIS — E079 Disorder of thyroid, unspecified: Secondary | ICD-10-CM | POA: Diagnosis not present

## 2019-04-08 DIAGNOSIS — D751 Secondary polycythemia: Secondary | ICD-10-CM | POA: Diagnosis not present

## 2019-04-08 DIAGNOSIS — K219 Gastro-esophageal reflux disease without esophagitis: Secondary | ICD-10-CM | POA: Insufficient documentation

## 2019-04-08 DIAGNOSIS — I10 Essential (primary) hypertension: Secondary | ICD-10-CM | POA: Insufficient documentation

## 2019-04-08 DIAGNOSIS — Z85810 Personal history of malignant neoplasm of tongue: Secondary | ICD-10-CM | POA: Insufficient documentation

## 2019-04-08 DIAGNOSIS — E876 Hypokalemia: Secondary | ICD-10-CM | POA: Diagnosis not present

## 2019-04-08 DIAGNOSIS — Z79899 Other long term (current) drug therapy: Secondary | ICD-10-CM | POA: Insufficient documentation

## 2019-04-08 DIAGNOSIS — C01 Malignant neoplasm of base of tongue: Secondary | ICD-10-CM

## 2019-04-08 DIAGNOSIS — Z7982 Long term (current) use of aspirin: Secondary | ICD-10-CM | POA: Insufficient documentation

## 2019-04-08 DIAGNOSIS — E039 Hypothyroidism, unspecified: Secondary | ICD-10-CM

## 2019-04-08 DIAGNOSIS — N289 Disorder of kidney and ureter, unspecified: Secondary | ICD-10-CM

## 2019-04-08 LAB — CBC WITH DIFFERENTIAL/PLATELET
Abs Immature Granulocytes: 0.03 10*3/uL (ref 0.00–0.07)
Basophils Absolute: 0.1 10*3/uL (ref 0.0–0.1)
Basophils Relative: 1 %
Eosinophils Absolute: 0.2 10*3/uL (ref 0.0–0.5)
Eosinophils Relative: 3 %
HCT: 48.5 % (ref 39.0–52.0)
Hemoglobin: 15.9 g/dL (ref 13.0–17.0)
Immature Granulocytes: 1 %
Lymphocytes Relative: 26 %
Lymphs Abs: 1.6 10*3/uL (ref 0.7–4.0)
MCH: 29.9 pg (ref 26.0–34.0)
MCHC: 32.8 g/dL (ref 30.0–36.0)
MCV: 91.2 fL (ref 80.0–100.0)
Monocytes Absolute: 0.8 10*3/uL (ref 0.1–1.0)
Monocytes Relative: 13 %
Neutro Abs: 3.7 10*3/uL (ref 1.7–7.7)
Neutrophils Relative %: 56 %
Platelets: 237 10*3/uL (ref 150–400)
RBC: 5.32 MIL/uL (ref 4.22–5.81)
RDW: 13.3 % (ref 11.5–15.5)
WBC: 6.4 10*3/uL (ref 4.0–10.5)
nRBC: 0 % (ref 0.0–0.2)

## 2019-04-08 LAB — COMPREHENSIVE METABOLIC PANEL
ALT: 16 U/L (ref 0–44)
AST: 18 U/L (ref 15–41)
Albumin: 3.6 g/dL (ref 3.5–5.0)
Alkaline Phosphatase: 42 U/L (ref 38–126)
Anion gap: 7 (ref 5–15)
BUN: 19 mg/dL (ref 8–23)
CO2: 26 mmol/L (ref 22–32)
Calcium: 8.6 mg/dL — ABNORMAL LOW (ref 8.9–10.3)
Chloride: 100 mmol/L (ref 98–111)
Creatinine, Ser: 1.8 mg/dL — ABNORMAL HIGH (ref 0.61–1.24)
GFR calc Af Amer: 44 mL/min — ABNORMAL LOW (ref >60)
GFR calc non Af Amer: 38 mL/min — ABNORMAL LOW (ref >60)
Glucose, Bld: 111 mg/dL — ABNORMAL HIGH (ref 70–99)
Potassium: 3.3 mmol/L — ABNORMAL LOW (ref 3.5–5.1)
Sodium: 133 mmol/L — ABNORMAL LOW (ref 135–145)
Total Bilirubin: 1.1 mg/dL (ref 0.3–1.2)
Total Protein: 7.1 g/dL (ref 6.5–8.1)

## 2019-04-08 LAB — TSH: TSH: 1.067 u[IU]/mL (ref 0.350–4.500)

## 2019-04-08 NOTE — Telephone Encounter (Signed)
The patient labs has been routed to his PCP ( Dr Ronnald Ramp).

## 2019-04-09 ENCOUNTER — Encounter: Payer: Self-pay | Admitting: Hematology and Oncology

## 2019-04-09 ENCOUNTER — Inpatient Hospital Stay (HOSPITAL_BASED_OUTPATIENT_CLINIC_OR_DEPARTMENT_OTHER): Payer: Medicare Other | Admitting: Hematology and Oncology

## 2019-04-09 ENCOUNTER — Other Ambulatory Visit: Payer: Medicare Other

## 2019-04-09 DIAGNOSIS — E876 Hypokalemia: Secondary | ICD-10-CM

## 2019-04-09 DIAGNOSIS — E039 Hypothyroidism, unspecified: Secondary | ICD-10-CM | POA: Diagnosis not present

## 2019-04-09 DIAGNOSIS — C01 Malignant neoplasm of base of tongue: Secondary | ICD-10-CM

## 2019-04-09 DIAGNOSIS — D751 Secondary polycythemia: Secondary | ICD-10-CM | POA: Diagnosis not present

## 2019-04-09 DIAGNOSIS — N183 Chronic kidney disease, stage 3 unspecified: Secondary | ICD-10-CM | POA: Diagnosis not present

## 2019-04-09 NOTE — Progress Notes (Signed)
No new changes noted today. The patient name and DOB has been verified by phone today. 

## 2019-04-16 DIAGNOSIS — Z8520 Personal history of malignant neoplasm of unspecified respiratory organ: Secondary | ICD-10-CM | POA: Diagnosis not present

## 2019-04-23 ENCOUNTER — Encounter: Payer: Self-pay | Admitting: Family Medicine

## 2019-04-23 ENCOUNTER — Other Ambulatory Visit: Payer: Self-pay

## 2019-04-23 ENCOUNTER — Ambulatory Visit (INDEPENDENT_AMBULATORY_CARE_PROVIDER_SITE_OTHER): Payer: Medicare Other | Admitting: Family Medicine

## 2019-04-23 VITALS — BP 120/80 | HR 80 | Ht 67.0 in | Wt 173.0 lb

## 2019-04-23 DIAGNOSIS — B029 Zoster without complications: Secondary | ICD-10-CM | POA: Diagnosis not present

## 2019-04-23 DIAGNOSIS — G629 Polyneuropathy, unspecified: Secondary | ICD-10-CM

## 2019-04-23 DIAGNOSIS — N183 Chronic kidney disease, stage 3 unspecified: Secondary | ICD-10-CM | POA: Diagnosis not present

## 2019-04-23 MED ORDER — VALACYCLOVIR HCL 1 G PO TABS: 1000.0000 mg | ORAL_TABLET | Freq: Two times a day (BID) | ORAL | 0 refills | Status: DC

## 2019-04-23 MED ORDER — GABAPENTIN 100 MG PO CAPS: 100.0000 mg | ORAL_CAPSULE | Freq: Three times a day (TID) | ORAL | 0 refills | Status: DC

## 2019-04-23 NOTE — Progress Notes (Signed)
Date:  04/23/2019   Name:  Troy [name]   DOB:  05-Feb-1951   MRN:  [id number]   Chief Complaint: Rash (itching and hurting)  Rash This is a new problem. The current episode started in the past 7 days (72 hours). The problem is unchanged. The affected locations include the right shoulder. The rash is characterized by burning, redness and itchiness. Pertinent negatives include no cough, diarrhea, eye pain, fatigue, fever, shortness of breath or sore throat. The treatment provided moderate relief.    Lab Results  Component Value Date   CREATININE 1.80 (H) 04/08/2019   BUN 19 04/08/2019   NA 133 (L) 04/08/2019   K 3.3 (L) 04/08/2019   CL 100 04/08/2019   CO2 26 04/08/2019   Lab Results  Component Value Date   CHOL 220 (H) 03/31/2017   HDL 58 03/31/2017   LDLCALC 144 (H) 03/31/2017   TRIG 90 03/31/2017   CHOLHDL 3.8 03/31/2017   Lab Results  Component Value Date   TSH 1.067 04/08/2019   No results found for: HGBA1C   Review of Systems  Constitutional: Negative for chills, fatigue and fever.  HENT: Negative for drooling, ear discharge, ear pain and sore throat.   Eyes: Negative for pain.  Respiratory: Negative for cough, shortness of breath and wheezing.   Cardiovascular: Negative for chest pain, palpitations and leg swelling.  Gastrointestinal: Negative for abdominal pain, blood in stool, constipation, diarrhea and nausea.  Endocrine: Negative for polydipsia.  Genitourinary: Negative for dysuria, frequency, hematuria and urgency.  Musculoskeletal: Negative for back pain, myalgias and neck pain.  Skin: Positive for rash.  Allergic/Immunologic: Negative for environmental allergies.  Neurological: Negative for dizziness and headaches.  Hematological: Does not bruise/bleed easily.  Psychiatric/Behavioral: Negative for suicidal ideas. The patient is not nervous/anxious.     Patient Active Problem List   Diagnosis Date Noted  . Hypokalemia 04/09/2019  . Hypocalcemia  01/03/2019  . Abnormal PET scan of head 10/26/2018  . Low TSH level 09/16/2018  . Renal insufficiency 07/09/2018  . Atherosclerosis of aorta ([place]) 06/27/2018  . Encounter for antineoplastic immunotherapy 05/18/2018  . Erythrocytosis 04/27/2018  . 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 Goals of care, counseling/discussion 04/07/2017  . CKD (chronic kidney disease) stage 3, GFR 30-59 ml/min 04/03/2017  . Hypothyroid 03/31/2017  . ED (erectile dysfunction) 03/31/2017  . Gastroesophageal reflux disease 09/23/2016  . Hypertension 09/23/2016  . Squamous cell carcinoma of base of tongue ([facility]) 08/04/2016    No Known Allergies  Past Surgical History:  Procedure Laterality Date  . tumor removed      Social History   Tobacco Use  . Smoking status: Never Smoker  . Smokeless tobacco: Never Used  Substance Use Topics  . Alcohol use: No  . Drug use: No     Medication list has been reviewed and updated.  Current Meds  Medication Sig  . acetaminophen (TYLENOL) 650 MG CR tablet Take 1,300 mg by mouth every 8 (eight) hours.  Marland Kitchen amLODipine (NORVASC) 5 MG tablet Take 5 mg by mouth daily.   Marland Kitchen aspirin EC 81 MG tablet Take 81 mg by mouth daily.  Marland Kitchen esomeprazole (NEXIUM) 40 MG capsule Take 1 capsule (40 mg total) by mouth daily.  Marland Kitchen levothyroxine (SYNTHROID) 125 MCG tablet Take 1 tablet (125 mcg total) by mouth daily.  Marland Kitchen lisinopril-hydrochlorothiazide (ZESTORETIC) 10-12.5 MG tablet Take 1 tablet by mouth daily.  . sildenafil (VIAGRA) 100 MG tablet Take 0.5-1 tablets (50-100 mg  total) by mouth daily as needed for erectile dysfunction.    PHQ 2/9 Scores 03/05/2019 05/18/2018 03/31/2017  PHQ - 2 Score 0 0 0  PHQ- 9 Score 0 1 -    BP Readings from Last 3 Encounters:  04/23/19 120/80  03/05/19 130/64  01/03/19 (!) 146/87    Physical Exam Vitals and nursing note reviewed.  HENT:     Head: Normocephalic.     Right Ear: Tympanic membrane, ear canal and external ear normal.     Left  Ear: Tympanic membrane, ear canal and external ear normal.     Nose: Nose normal.  Eyes:     General: No scleral icterus.       Right eye: No discharge.        Left eye: No discharge.     Conjunctiva/sclera: Conjunctivae normal.     Pupils: Pupils are equal, round, and reactive to light.  Neck:     Thyroid: No thyromegaly.     Vascular: No JVD.     Trachea: No tracheal deviation.  Cardiovascular:     Rate and Rhythm: Normal rate and regular rhythm.     Heart sounds: Normal heart sounds. No murmur. No friction rub. No gallop.   Pulmonary:     Effort: No respiratory distress.     Breath sounds: Normal breath sounds. No wheezing or rales.  Abdominal:     General: Bowel sounds are normal.     Palpations: Abdomen is soft. There is no mass.     Tenderness: There is no abdominal tenderness. There is no guarding or rebound.  Musculoskeletal:        General: No tenderness. Normal range of motion.     Cervical back: Normal range of motion and neck supple.  Lymphadenopathy:     Cervical: No cervical adenopathy.  Skin:    General: Skin is warm.     Findings: Rash present. Rash is vesicular.     Comments: Clusters with erythematous base  Neurological:     Mental Status: He is alert and oriented to person, place, and time.     Cranial Nerves: No cranial nerve deficit.     Deep Tendon Reflexes: Reflexes are normal and symmetric.     Wt Readings from Last 3 Encounters:  04/23/19 173 lb (78.5 kg)  03/05/19 173 lb (78.5 kg)  01/03/19 175 lb 14.8 oz (79.8 kg)    BP 120/80   Pulse 80   Ht 5\' 7"  (1.702 m)   Wt 173 lb (78.5 kg)   BMI 27.10 kg/m   Assessment and Plan: 1. Herpes zoster without complication Acute.  Persistent.  Relatively stable.  With some discomfort.  Will initiate Valtrex 1 g twice a day for 7 days. - valACYclovir (VALTREX) 1000 MG tablet; Take 1 tablet (1,000 mg total) by mouth 2 (two) times daily.  Dispense: 14 tablet; Refill: 0  2. Stage 3 chronic kidney disease,  unspecified whether stage 3a or 3b CKD Patient has a history of chronic kidney disease that is followed by urology.  This is necessitated a decreased dosing of the Valtrex as noted above.  3. Neuropathy Patient having some discomfort and will control this with a little gabapentin 100 mg 3 times a day as needed for a week. - gabapentin (NEURONTIN) 100 MG capsule; Take 1 capsule (100 mg total) by mouth 3 (three) times daily.  Dispense: 30 capsule; Refill: 0

## 2019-06-19 ENCOUNTER — Other Ambulatory Visit: Payer: Self-pay | Admitting: Family Medicine

## 2019-06-19 NOTE — Telephone Encounter (Signed)
Requested medication (s) are due for refill today: Yes  Requested medication (s) are on the active medication list: Yes  Last refill:  09/04/18  Future visit scheduled: Yes  Notes to clinic:  List historical provider on last refill.    Requested Prescriptions  Pending Prescriptions Disp Refills   amLODipine (NORVASC) 5 MG tablet [Pharmacy Med Name: AMLODIPINE BESYLATE 5 MG TAB] 30 tablet 0    Sig: Take 1 tablet by mouth once a day      Cardiovascular:  Calcium Channel Blockers Passed - 06/19/2019 11:07 AM      Passed - Last BP in normal range    BP Readings from Last 1 Encounters:  04/23/19 120/80          Passed - Valid encounter within last 6 months    Recent Outpatient Visits           1 month ago Herpes zoster without complication   [facility] Juline Patch, MD   3 months ago Essential hypertension   [place], MD   11 months ago Elevated serum creatinine   [facility] Juline Patch, MD   11 months ago Essential hypertension   [facility] Juline Patch, MD   1 year ago Essential hypertension   [place], Deanna C, MD       Future Appointments             In 2 months Juline Patch, MD [facility], [facility]

## 2019-06-19 NOTE — Telephone Encounter (Signed)
Patient is coming in this Friday for med refill for htn.

## 2019-06-21 ENCOUNTER — Ambulatory Visit: Payer: Medicare Other | Admitting: Family Medicine

## 2019-07-04 ENCOUNTER — Other Ambulatory Visit: Payer: Self-pay

## 2019-07-04 ENCOUNTER — Ambulatory Visit (INDEPENDENT_AMBULATORY_CARE_PROVIDER_SITE_OTHER): Payer: Medicare Other | Admitting: Family Medicine

## 2019-07-04 ENCOUNTER — Encounter: Payer: Self-pay | Admitting: Family Medicine

## 2019-07-04 VITALS — BP 134/80 | HR 80 | Ht 67.0 in | Wt 172.0 lb

## 2019-07-04 DIAGNOSIS — K219 Gastro-esophageal reflux disease without esophagitis: Secondary | ICD-10-CM | POA: Diagnosis not present

## 2019-07-04 DIAGNOSIS — N5201 Erectile dysfunction due to arterial insufficiency: Secondary | ICD-10-CM | POA: Diagnosis not present

## 2019-07-04 DIAGNOSIS — E890 Postprocedural hypothyroidism: Secondary | ICD-10-CM | POA: Diagnosis not present

## 2019-07-04 DIAGNOSIS — I10 Essential (primary) hypertension: Secondary | ICD-10-CM

## 2019-07-04 MED ORDER — AMLODIPINE BESYLATE 5 MG PO TABS: 5.0000 mg | ORAL_TABLET | Freq: Every day | ORAL | 5 refills | Status: DC

## 2019-07-04 MED ORDER — ESOMEPRAZOLE MAGNESIUM 40 MG PO CPDR: 40.0000 mg | DELAYED_RELEASE_CAPSULE | Freq: Every day | ORAL | 5 refills | Status: DC

## 2019-07-04 MED ORDER — LEVOTHYROXINE SODIUM 125 MCG PO TABS: 125.0000 ug | ORAL_TABLET | Freq: Every day | ORAL | 5 refills | Status: DC

## 2019-07-04 MED ORDER — SILDENAFIL CITRATE 100 MG PO TABS: 50.0000 mg | ORAL_TABLET | Freq: Every day | ORAL | 11 refills | Status: DC | PRN

## 2019-07-04 NOTE — Progress Notes (Signed)
Date:  07/04/2019   Name:  Troy [name]   DOB:  [birth year]   MRN:  [id number]   Chief Complaint: Hypertension, Gastroesophageal Reflux, Hypothyroidism, and Erectile Dysfunction  Hypertension This is a chronic problem. The current episode started more than 1 year ago. The problem has been gradually improving since onset. The problem is controlled. Pertinent negatives include no anxiety, blurred vision, chest pain, headaches, malaise/fatigue, neck pain, orthopnea, palpitations, peripheral edema, PND, shortness of breath or sweats. There are no associated agents to hypertension. There are no known risk factors for coronary artery disease. Past treatments include calcium channel blockers. The current treatment provides moderate improvement. There are no compliance problems.  There is no history of angina, kidney disease, CAD/MI, CVA, heart failure, left ventricular hypertrophy, PVD or retinopathy. Identifiable causes of hypertension include a thyroid problem. There is no history of chronic renal disease, a hypertension causing med or renovascular disease.  Gastroesophageal Reflux He reports no abdominal pain, no belching, no chest pain, no coughing, no dysphagia, no nausea, no sore throat or no wheezing. This is a chronic problem. The problem has been gradually improving. The symptoms are aggravated by certain foods. Pertinent negatives include no fatigue or weight loss. He has tried a PPI for the symptoms.  Erectile Dysfunction This is a chronic problem. The problem has been gradually improving since onset. The nature of his difficulty is achieving erection and maintaining erection. He reports no anxiety. Irritative symptoms do not include frequency or urgency. Pertinent negatives include no chills, dysuria or hematuria.  Thyroid Problem Presents for follow-up visit. Patient reports no anxiety, cold intolerance, constipation, depressed mood, diarrhea, fatigue, heat intolerance, palpitations or  weight loss. The symptoms have been stable. There is no history of heart failure.    Lab Results  Component Value Date   CREATININE 1.80 (H) 04/08/2019   BUN 19 04/08/2019   NA 133 (L) 04/08/2019   K 3.3 (L) 04/08/2019   CL 100 04/08/2019   CO2 26 04/08/2019   Lab Results  Component Value Date   CHOL 220 (H) 03/31/2017   HDL 58 03/31/2017   LDLCALC 144 (H) 03/31/2017   TRIG 90 03/31/2017   CHOLHDL 3.8 03/31/2017   Lab Results  Component Value Date   TSH 1.067 04/08/2019   No results found for: HGBA1C Lab Results  Component Value Date   WBC 6.4 04/08/2019   HGB 15.9 04/08/2019   HCT 48.5 04/08/2019   MCV 91.2 04/08/2019   PLT 237 04/08/2019   Lab Results  Component Value Date   ALT 16 04/08/2019   AST 18 04/08/2019   ALKPHOS 42 04/08/2019   BILITOT 1.1 04/08/2019     Review of Systems  Constitutional: Negative for chills, fatigue, fever, malaise/fatigue and weight loss.  HENT: Negative for drooling, ear discharge, ear pain and sore throat.   Eyes: Negative for blurred vision.  Respiratory: Negative for cough, shortness of breath and wheezing.   Cardiovascular: Negative for chest pain, palpitations, orthopnea, leg swelling and PND.  Gastrointestinal: Negative for abdominal pain, blood in stool, constipation, diarrhea, dysphagia and nausea.  Endocrine: Negative for cold intolerance, heat intolerance and polydipsia.  Genitourinary: Negative for dysuria, frequency, hematuria and urgency.  Musculoskeletal: Negative for back pain, myalgias and neck pain.  Skin: Negative for rash.  Allergic/Immunologic: Negative for environmental allergies.  Neurological: Negative for dizziness and headaches.  Hematological: Does not bruise/bleed easily.  Psychiatric/Behavioral: Negative for suicidal ideas. The patient is not nervous/anxious.  Patient Active Problem List   Diagnosis Date Noted  . Hypokalemia 04/09/2019  . Hypocalcemia 01/03/2019  . Abnormal PET scan of head  10/26/2018  . Low TSH level 09/16/2018  . Renal insufficiency 07/09/2018  . Atherosclerosis of aorta ([place]) 06/27/2018  . Encounter for antineoplastic immunotherapy 05/18/2018  . Erythrocytosis 04/27/2018  . 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 Goals of care, counseling/discussion 04/07/2017  . CKD (chronic kidney disease) stage 3, GFR 30-59 ml/min 04/03/2017  . Hypothyroid 03/31/2017  . ED (erectile dysfunction) 03/31/2017  . Gastroesophageal reflux disease 09/23/2016  . Hypertension 09/23/2016  . Squamous cell carcinoma of base of tongue (HCC) 08/04/2016    Allergies  Allergen Reactions  . Ace Inhibitors Cough    Past Surgical History:  Procedure Laterality Date  . tumor removed      Social History   Tobacco Use  . Smoking status: Never Smoker  . Smokeless tobacco: Never Used  Substance Use Topics  . Alcohol use: No  . Drug use: No     Medication list has been reviewed and updated.  Current Meds  Medication Sig  . acetaminophen (TYLENOL) 650 MG CR tablet Take 1,300 mg by mouth every 8 (eight) hours.  Marland Kitchen amLODipine (NORVASC) 5 MG tablet Take 1 tablet by mouth once a day  . aspirin EC 81 MG tablet Take 81 mg by mouth daily.  Marland Kitchen esomeprazole (NEXIUM) 40 MG capsule Take 1 capsule (40 mg total) by mouth daily.  Marland Kitchen levothyroxine (SYNTHROID) 125 MCG tablet Take 1 tablet (125 mcg total) by mouth daily.  . sildenafil (VIAGRA) 100 MG tablet Take 0.5-1 tablets (50-100 mg total) by mouth daily as needed for erectile dysfunction.  . [DISCONTINUED] gabapentin (NEURONTIN) 100 MG capsule Take 1 capsule (100 mg total) by mouth 3 (three) times daily.  . [DISCONTINUED] lisinopril-hydrochlorothiazide (ZESTORETIC) 10-12.5 MG tablet Take 1 tablet by mouth daily.    PHQ 2/9 Scores 07/04/2019 03/05/2019 05/18/2018 03/31/2017  PHQ - 2 Score 0 0 0 0  PHQ- 9 Score 0 0 1 -    BP Readings from Last 3 Encounters:  07/04/19 134/80  04/23/19 120/80  03/05/19 130/64    Physical  Exam Vitals and nursing note reviewed.  HENT:     Head: Normocephalic.     Right Ear: Tympanic membrane and external ear normal.     Left Ear: Tympanic membrane and external ear normal.     Nose: Nose normal.  Eyes:     General: No scleral icterus.       Right eye: No discharge.        Left eye: No discharge.     Conjunctiva/sclera: Conjunctivae normal.     Pupils: Pupils are equal, round, and reactive to light.  Neck:     Thyroid: No thyromegaly.     Vascular: No JVD.     Trachea: No tracheal deviation.  Cardiovascular:     Rate and Rhythm: Normal rate and regular rhythm.     Heart sounds: Normal heart sounds, S1 normal and S2 normal. No murmur. No systolic murmur. No diastolic murmur. No friction rub. No gallop. No S3 or S4 sounds.   Pulmonary:     Effort: No respiratory distress.     Breath sounds: Normal breath sounds. No wheezing or rales.  Abdominal:     General: Bowel sounds are normal.     Palpations: Abdomen is soft. There is no mass.     Tenderness: There is no abdominal tenderness. There is  no guarding or rebound.  Musculoskeletal:        General: No tenderness. Normal range of motion.     Cervical back: Normal range of motion and neck supple.     Right lower leg: No edema.     Left lower leg: No edema.  Lymphadenopathy:     Cervical: No cervical adenopathy.  Skin:    General: Skin is warm.     Findings: No rash.  Neurological:     Mental Status: He is alert and oriented to person, place, and time.     Cranial Nerves: No cranial nerve deficit.     Deep Tendon Reflexes: Reflexes are normal and symmetric.     Wt Readings from Last 3 Encounters:  07/04/19 172 lb (78 kg)  04/23/19 173 lb (78.5 kg)  03/05/19 173 lb (78.5 kg)    BP 134/80   Pulse 80   Ht 5\' 7"  (1.702 m)   Wt 172 lb (78 kg)   BMI 26.94 kg/m   Assessment and Plan: 1. Gastroesophageal reflux disease, unspecified whether esophagitis present Chronic.  Controlled.  Stable.  Continue Nexium 40  mg once a day.  Will recheck in 6 months. - esomeprazole (NEXIUM) 40 MG capsule; Take 1 capsule (40 mg total) by mouth daily.  Dispense: 30 capsule; Refill: 5  2. Postoperative hypothyroidism Chronic.  Controlled.  Stable.  Continue levothyroxine at 125 mcg.  We will recheck TSH per previous visit. - levothyroxine (SYNTHROID) 125 MCG tablet; Take 1 tablet (125 mcg total) by mouth daily.  Dispense: 30 tablet; Refill: 5  3. Erectile dysfunction due to arterial insufficiency 5.  Controlled.  Patient has satisfaction at current dosing we will continue at current sildenafil 100 mg 1/2 to 1 tablet as needed. - sildenafil (VIAGRA) 100 MG tablet; Take 0.5-1 tablets (50-100 mg total) by mouth daily as needed for erectile dysfunction.  Dispense: 5 tablet; Refill: 11  4. Essential hypertension Chronic.  Controlled.  Stable.  Patient was unable to tolerate previous medication due to persistent cough he is currently on amlodipine and has excellent control of his blood pressure 134/80. - amLODipine (NORVASC) 5 MG tablet; Take 1 tablet (5 mg total) by mouth daily.  Dispense: 30 tablet; Refill: 5

## 2019-07-07 NOTE — Progress Notes (Signed)
[facility]  [address] [place], [place] [address] Phone: [phone]  Fax: [phone]   Clinic Day:  07/08/2019  Referring physician: Juline Patch, MD  Chief Complaint: Troy [name] is a 69 y.o. 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   HPI: The patient was last seen in the medical oncology clinic on 04/09/2019 for a telephone visit.  At that time, he was doing well. He denied any concerns. He had not been on potassium supplementation. CBC was normal. Sodium 133, potassium 3.3, creatinine 1.80, calcium 8.6. TSH was 1.067.  Surveillance continued.   Patient was presented to Dr. Ronnald Ramp with a rash on 04/23/2019. The rash was present for 1 week. The rash on was located on his right shoulder with associated burning, redness, and itching. Patient was treated for shingles. He began Valtrex 1 g BID x 7 days.   During the interim, the patient was doing great. He has chronic dry mouth. He has no jaw paint. He denies trouble swallowing. Right shoulder and chest are healing after receiving treatment for shingles. His calcium was low and I recommended eating some ice cream and other foods rich in calcium.   Past Medical History:  Diagnosis Date  . Cancer ([place])   . GERD (gastroesophageal reflux disease)   . Hypertension   . Thyroid disease     Past Surgical History:  Procedure Laterality Date  . tumor removed      Family History  Problem Relation Age of Onset  . Cancer Mother   . Cancer Sister   . Cancer Maternal Aunt   . Cancer Paternal Uncle     Social History:  reports that he has never smoked. He has never used smokeless tobacco. He reports that he does not drink alcohol or use drugs.  He lives in [place].He has been helping his sister with yard work.He is currently staying with his sister The patient is alone today.  Allergies:  Allergies  Allergen Reactions  . Ace  Inhibitors Cough    Current Medications: Current Outpatient Medications  Medication Sig Dispense Refill  . acetaminophen (TYLENOL) 650 MG CR tablet Take 1,300 mg by mouth every 8 (eight) hours.    Marland Kitchen amLODipine (NORVASC) 5 MG tablet Take 1 tablet (5 mg total) by mouth daily. 30 tablet 5  . aspirin EC 81 MG tablet Take 81 mg by mouth daily.    Marland Kitchen esomeprazole (NEXIUM) 40 MG capsule Take 1 capsule (40 mg total) by mouth daily. 30 capsule 5  . levothyroxine (SYNTHROID) 125 MCG tablet Take 1 tablet (125 mcg total) by mouth daily. 30 tablet 5  . sildenafil (VIAGRA) 100 MG tablet Take 0.5-1 tablets (50-100 mg total) by mouth daily as needed for erectile dysfunction. 5 tablet 11   No current facility-administered medications for this visit.    Review of Systems  Constitutional: Positive for weight loss (3 lbs). Negative for chills, diaphoresis, fever and malaise/fatigue.       Feels "great".  HENT: Negative.  Negative for congestion, ear discharge, ear pain, hearing loss, nosebleeds, sinus pain and sore throat.        No jaw pain. Chronic dry mouth. Needs teeth extracted.  Eyes: Negative.  Negative for blurred vision, double vision and photophobia.  Respiratory: Negative.  Negative for cough, hemoptysis, sputum production and shortness of breath.   Cardiovascular: Negative.  Negative for chest pain, palpitations, orthopnea and  leg swelling.  Gastrointestinal: Negative.  Negative for abdominal pain, blood in stool, constipation, diarrhea, melena, nausea and vomiting.  Genitourinary: Negative.  Negative for dysuria, frequency, hematuria and urgency.  Musculoskeletal: Negative.  Negative for back pain, joint pain, myalgias and neck pain.  Skin: Negative.  Negative for rash.       Right shoulder and chest healing well after Valtrex.  Neurological: Negative.  Negative for dizziness, tingling, sensory change, speech change, focal weakness, weakness and headaches.  Endo/Heme/Allergies: Negative for  environmental allergies. Does not bruise/bleed easily.  Psychiatric/Behavioral: Negative.  Negative for depression and memory loss. The patient is not nervous/anxious and does not have insomnia.   All other systems reviewed and are negative.   Performance status (ECOG):  1  Vitals  Blood pressure (!) 152/88, pulse 91, temperature (!) 96.5 F (35.8 C), temperature source Tympanic, resp. rate 16, weight 172 lb 9.9 oz (78.3 kg), SpO2 100 %.  Physical Exam  Constitutional: He is oriented to person, place, and time. He appears well-developed and well-nourished. No distress.  HENT:  Head: Normocephalic and atraumatic.  Mouth/Throat: Oropharynx is clear and moist. No oropharyngeal exudate.  Eyes: Pupils are equal, round, and reactive to light. Conjunctivae and EOM are normal. No scleral icterus.  Cardiovascular: Normal rate, regular rhythm and normal heart sounds.  No murmur heard. Pulmonary/Chest: Effort normal and breath sounds normal. No respiratory distress. He has no wheezes. He has no rales. He exhibits no tenderness.  Abdominal: Soft. Bowel sounds are normal. He exhibits no distension and no mass. There is no abdominal tenderness. There is no rebound and no guarding.  Musculoskeletal:        General: No tenderness or edema. Normal range of motion.     Cervical back: Normal range of motion and neck supple.  Lymphadenopathy:    He has no cervical adenopathy.    He has no axillary adenopathy.       Right: No supraclavicular adenopathy present.       Left: No supraclavicular adenopathy present.  Neurological: He is alert and oriented to person, place, and time.  Skin: Skin is warm and dry. He is not diaphoretic.  Scar on right shoulder and chest healing after shingles.  Psychiatric: He has a normal mood and affect. His behavior is normal. Judgment and thought content normal.  Nursing note and vitals reviewed.   Appointment on 07/08/2019  Component Date Value Ref Range Status  .  Sodium 07/08/2019 135  135 - 145 mmol/L Final  . Potassium 07/08/2019 3.5  3.5 - 5.1 mmol/L Final  . Chloride 07/08/2019 102  98 - 111 mmol/L Final  . CO2 07/08/2019 24  22 - 32 mmol/L Final  . Glucose, Bld 07/08/2019 105* 70 - 99 mg/dL Final   Glucose reference range applies only to samples taken after fasting for at least 8 hours.  . BUN 07/08/2019 15  8 - 23 mg/dL Final  . Creatinine, Ser 07/08/2019 1.54* 0.61 - 1.24 mg/dL Final  . Calcium 07/08/2019 8.4* 8.9 - 10.3 mg/dL Final  . Total Protein 07/08/2019 7.2  6.5 - 8.1 g/dL Final  . Albumin 07/08/2019 3.6  3.5 - 5.0 g/dL Final  . AST 07/08/2019 19  15 - 41 U/L Final  . ALT 07/08/2019 16  0 - 44 U/L Final  . Alkaline Phosphatase 07/08/2019 48  38 - 126 U/L Final  . Total Bilirubin 07/08/2019 0.6  0.3 - 1.2 mg/dL Final  . GFR calc non Af Amer 07/08/2019 46* >  60 mL/min Final  . GFR calc Af Amer 07/08/2019 53* >60 mL/min Final  . Anion gap 07/08/2019 9  5 - 15 Final   Performed at [facility], [address], [place], [place] [address]  . WBC 07/08/2019 5.0  4.0 - 10.5 K/uL Final  . RBC 07/08/2019 4.97  4.22 - 5.81 MIL/uL Final  . Hemoglobin 07/08/2019 15.5  13.0 - 17.0 g/dL Final  . HCT 07/08/2019 46.0  39.0 - 52.0 % Final  . MCV 07/08/2019 92.6  80.0 - 100.0 fL Final  . MCH 07/08/2019 31.2  26.0 - 34.0 pg Final  . MCHC 07/08/2019 33.7  30.0 - 36.0 g/dL Final  . RDW 07/08/2019 13.3  11.5 - 15.5 % Final  . Platelets 07/08/2019 178  150 - 400 K/uL Final  . nRBC 07/08/2019 0.0  0.0 - 0.2 % Final  . Neutrophils Relative % 07/08/2019 52  % Final  . Neutro Abs 07/08/2019 2.6  1.7 - 7.7 K/uL Final  . Lymphocytes Relative 07/08/2019 27  % Final  . Lymphs Abs 07/08/2019 1.3  0.7 - 4.0 K/uL Final  . Monocytes Relative 07/08/2019 16  % Final  . Monocytes Absolute 07/08/2019 0.8  0.1 - 1.0 K/uL Final  . Eosinophils Relative 07/08/2019 3  % Final  . Eosinophils Absolute 07/08/2019 0.1  0.0 - 0.5 K/uL Final  . Basophils Relative  07/08/2019 1  % Final  . Basophils Absolute 07/08/2019 0.0  0.0 - 0.1 K/uL Final  . Immature Granulocytes 07/08/2019 1  % Final  . Abs Immature Granulocytes 07/08/2019 0.03  0.00 - 0.07 K/uL Final   Performed at [facility], [address] [place], [place] [address]    Assessment:  ARPAN ARNWINE is a 69 y.o. male with recurrent base of tongue carcinoma. Clinical stagewasT3 N3 M0, p16+ in 11/2014. He received concurrent cisplatin and radiationfrom 03/2015 - 05/21/2015.   PET scanon 09/02/2015 was equivocal. PET scanon 12/07/2015 revealed abnormal FDG uptake (reactive from prior radiation therapy).   PET scanon 07/05/2016 revealed an enlarging level 2 lymph node (increasing SUV).   PET scanon 04/12/2017 revealed no evidence of hypermetabolic local recurrence in the oral cavity or metastatic disease within the neck, chest, abdomen or pelvis. There was no suspicious metabolic activity.   Biopsyin 07/2017 confirmed recurrence. He had several delays in treatment but was scheduled to have surgery. Unfortunately, he was found that recurrence was inoperable.   Based onKEYNOTE-048 trial,he initiated palliative Keytruda (pembrolizumab)every 3 weeks on09/13/2018(last 10/26/2018).He will plan for 2 years of treatment any toxicity.  Neck and chest CTon 04/03/2018 revealed no evidence of residual or recurrent disease. He has chronic posttreatment changes of the tongue. Chronic scarring and right parotid and right neck.  PET scanon08/06/2020was independently reviewed and I agree with radiologist findings which showno specific findings suggestive of local recurrence or distant metastatic disease. There was a new asymmetric focus of increased uptake posterior to left mandibular condyle.Soft tissue neck MRIon 10/19/2018 revealed no anatomic explanation for the uptake posterior to the left mandibular condyle.There was nomass or metastatic disease identified  by MRI.  He has renal insufficiency.Baseline creatininehas been1.62 - 1.81 in past year.Urinalysis on 07/04/2018 revealed no proteinuria or hematuria.  He has erythrocytosis. Hemoglobin was 16.7 on 04/06/2018. Work-upon 04/27/2018 revealed the followingnormal/negative tests: JAK2 V617F, exon 12-15, CALR, MPL, epo level (13.3). Carbon monoxide was 3.9% (0-3.6%)with repeat 3.0% on 07/04/2018. Testosteronewas normal on 07/04/2018. He does not smoke. No formal diagnosis of sleep  apnea but symptomatic. Encouraged him to discuss sleep testing with his PCP.  Symptomatically, he is doing well.  Skin has healed following an episode of shingles during the interim.  Plan: 1.   Labs today: CBC with diff, CMP, TSH. 2.Recurrent base of tongue carcinoma Clinically, he is doing well. He completed 2 years of pembrolizumab based on theKeynote-048 trial on 10/26/2018. PETscanon 08/06/2020and soft tissue neck MRIon 10/19/2018 revealed: Focus of increased uptake in the left mandibular condyle (unclear significance). No abnormality seen on soft tissue MRI. Exam is unremarkable. Continue follow-up with ENT for ongoing surveillance. Continue surveillance in the oncology clinic every 6 months. 3.Renal insufficiency Creatinine 1.54.    Patienthas chronic renal insufficiency.  Baseline creatinine 1.62 - 1.81 in past year.             Continue to monitor. 4.Hypothyroidism TSH 0.391 today. Patient remains on levothyroxine.  Continue to monitor. 5.Erythrocytosis, improved Hematocrit  46.0. Hemoglobin 15.5. JAK2 with reflex, carbon monoxide, epo level, and testosterone were normal. Continue to monitor 6.Hypocalcemia             Calcium 8.4 (corrected calcium 8.74).             Continue supplemental calcium. 7.   Hypokalemia  Potassium 3.5.  Patient has chronic hypokalemia secondary to lisinopril-HCTZ.  Continue to  monitor. 8.   RTC in 6 months for MD assessment (in person) and labs (CBC with diff, CMP, TSH, free T4).  I discussed the assessment and treatment plan with the patient.  The patient was provided an opportunity to ask questions and all were answered.  The patient agreed with the plan and demonstrated an understanding of the instructions.  The patient was advised to call back or seek an in person evaluation if the symptoms worsen or if the condition fails to improve as anticipated.   Troy Stalls, MD, PhD  07/08/2019, 11:16 AM  I, Selena Batten, am acting as scribe for [employer]. Mike Gip, MD, PhD.  I, Melissa C. Mike Gip, MD, have reviewed the above documentation for accuracy and completeness, and I agree with the above.

## 2019-07-08 ENCOUNTER — Other Ambulatory Visit: Payer: Self-pay

## 2019-07-08 ENCOUNTER — Inpatient Hospital Stay: Payer: Medicare Other

## 2019-07-08 ENCOUNTER — Inpatient Hospital Stay: Payer: Medicare Other | Attending: Hematology and Oncology | Admitting: Hematology and Oncology

## 2019-07-08 ENCOUNTER — Encounter: Payer: Self-pay | Admitting: Hematology and Oncology

## 2019-07-08 VITALS — BP 152/88 | HR 91 | Temp 96.5°F | Resp 16 | Wt 172.6 lb

## 2019-07-08 DIAGNOSIS — C01 Malignant neoplasm of base of tongue: Secondary | ICD-10-CM

## 2019-07-08 DIAGNOSIS — Z85810 Personal history of malignant neoplasm of tongue: Secondary | ICD-10-CM | POA: Insufficient documentation

## 2019-07-08 DIAGNOSIS — E039 Hypothyroidism, unspecified: Secondary | ICD-10-CM | POA: Diagnosis not present

## 2019-07-08 DIAGNOSIS — N289 Disorder of kidney and ureter, unspecified: Secondary | ICD-10-CM

## 2019-07-08 DIAGNOSIS — E876 Hypokalemia: Secondary | ICD-10-CM

## 2019-07-08 LAB — COMPREHENSIVE METABOLIC PANEL
ALT: 16 U/L (ref 0–44)
AST: 19 U/L (ref 15–41)
Albumin: 3.6 g/dL (ref 3.5–5.0)
Alkaline Phosphatase: 48 U/L (ref 38–126)
Anion gap: 9 (ref 5–15)
BUN: 15 mg/dL (ref 8–23)
CO2: 24 mmol/L (ref 22–32)
Calcium: 8.4 mg/dL — ABNORMAL LOW (ref 8.9–10.3)
Chloride: 102 mmol/L (ref 98–111)
Creatinine, Ser: 1.54 mg/dL — ABNORMAL HIGH (ref 0.61–1.24)
GFR calc Af Amer: 53 mL/min — ABNORMAL LOW (ref >60)
GFR calc non Af Amer: 46 mL/min — ABNORMAL LOW (ref >60)
Glucose, Bld: 105 mg/dL — ABNORMAL HIGH (ref 70–99)
Potassium: 3.5 mmol/L (ref 3.5–5.1)
Sodium: 135 mmol/L (ref 135–145)
Total Bilirubin: 0.6 mg/dL (ref 0.3–1.2)
Total Protein: 7.2 g/dL (ref 6.5–8.1)

## 2019-07-08 LAB — CBC WITH DIFFERENTIAL/PLATELET
Abs Immature Granulocytes: 0.03 10*3/uL (ref 0.00–0.07)
Basophils Absolute: 0 10*3/uL (ref 0.0–0.1)
Basophils Relative: 1 %
Eosinophils Absolute: 0.1 10*3/uL (ref 0.0–0.5)
Eosinophils Relative: 3 %
HCT: 46 % (ref 39.0–52.0)
Hemoglobin: 15.5 g/dL (ref 13.0–17.0)
Immature Granulocytes: 1 %
Lymphocytes Relative: 27 %
Lymphs Abs: 1.3 10*3/uL (ref 0.7–4.0)
MCH: 31.2 pg (ref 26.0–34.0)
MCHC: 33.7 g/dL (ref 30.0–36.0)
MCV: 92.6 fL (ref 80.0–100.0)
Monocytes Absolute: 0.8 10*3/uL (ref 0.1–1.0)
Monocytes Relative: 16 %
Neutro Abs: 2.6 10*3/uL (ref 1.7–7.7)
Neutrophils Relative %: 52 %
Platelets: 178 10*3/uL (ref 150–400)
RBC: 4.97 MIL/uL (ref 4.22–5.81)
RDW: 13.3 % (ref 11.5–15.5)
WBC: 5 10*3/uL (ref 4.0–10.5)
nRBC: 0 % (ref 0.0–0.2)

## 2019-07-08 LAB — TSH: TSH: 0.391 u[IU]/mL (ref 0.350–4.500)

## 2019-07-08 NOTE — Progress Notes (Signed)
Patient here for follow up. Denies any concerns.  

## 2019-09-03 ENCOUNTER — Other Ambulatory Visit: Payer: Self-pay

## 2019-09-03 ENCOUNTER — Ambulatory Visit (INDEPENDENT_AMBULATORY_CARE_PROVIDER_SITE_OTHER): Payer: Medicare Other | Admitting: Family Medicine

## 2019-09-03 ENCOUNTER — Encounter: Payer: Self-pay | Admitting: Family Medicine

## 2019-09-03 VITALS — BP 120/78 | HR 88 | Ht 67.0 in | Wt 167.0 lb

## 2019-09-03 DIAGNOSIS — I700 Atherosclerosis of aorta: Secondary | ICD-10-CM | POA: Diagnosis not present

## 2019-09-03 DIAGNOSIS — K219 Gastro-esophageal reflux disease without esophagitis: Secondary | ICD-10-CM | POA: Diagnosis not present

## 2019-09-03 DIAGNOSIS — E890 Postprocedural hypothyroidism: Secondary | ICD-10-CM

## 2019-09-03 DIAGNOSIS — Z1211 Encounter for screening for malignant neoplasm of colon: Secondary | ICD-10-CM | POA: Diagnosis not present

## 2019-09-03 DIAGNOSIS — I10 Essential (primary) hypertension: Secondary | ICD-10-CM

## 2019-09-03 DIAGNOSIS — E782 Mixed hyperlipidemia: Secondary | ICD-10-CM

## 2019-09-03 DIAGNOSIS — N5201 Erectile dysfunction due to arterial insufficiency: Secondary | ICD-10-CM

## 2019-09-03 MED ORDER — ESOMEPRAZOLE MAGNESIUM 40 MG PO CPDR: 40.0000 mg | DELAYED_RELEASE_CAPSULE | Freq: Every day | ORAL | 5 refills | Status: DC

## 2019-09-03 MED ORDER — SILDENAFIL CITRATE 100 MG PO TABS: 50.0000 mg | ORAL_TABLET | Freq: Every day | ORAL | 11 refills | Status: DC | PRN

## 2019-09-03 MED ORDER — LEVOTHYROXINE SODIUM 125 MCG PO TABS: 125.0000 ug | ORAL_TABLET | Freq: Every day | ORAL | 5 refills | Status: DC

## 2019-09-03 MED ORDER — AMLODIPINE BESYLATE 5 MG PO TABS: 5.0000 mg | ORAL_TABLET | Freq: Every day | ORAL | 5 refills | Status: DC

## 2019-09-03 NOTE — Progress Notes (Signed)
Date:  09/03/2019   Name:  Troy [name]   DOB:  08-Mar-1950   MRN:  [id number]   Chief Complaint: Gastroesophageal Reflux, Hypertension, Hypothyroidism, and Erectile Dysfunction  Gastroesophageal Reflux He reports no abdominal pain, no belching, no chest pain, no choking, no coughing, no dysphagia, no early satiety, no globus sensation, no heartburn, no hoarse voice, no nausea, no sore throat, no stridor, no tooth decay, no water brash or no wheezing. This is a chronic problem. The current episode started more than 1 year ago. The problem has been gradually improving. The symptoms are aggravated by certain foods. Pertinent negatives include no anemia, fatigue, melena, muscle weakness, orthopnea or weight loss. There are no known risk factors. He has tried a PPI for the symptoms. The treatment provided moderate relief.  Hypertension This is a chronic problem. The current episode started more than 1 year ago. The problem has been gradually improving since onset. The problem is controlled. Pertinent negatives include no chest pain, headaches, neck pain, palpitations or shortness of breath. There are no associated agents to hypertension. There are no known risk factors for coronary artery disease. Past treatments include calcium channel blockers. The current treatment provides moderate improvement. There are no compliance problems.  There is no history of angina, kidney disease, CAD/MI, CVA, heart failure, left ventricular hypertrophy, PVD or retinopathy. Identifiable causes of hypertension include a thyroid problem. There is no history of chronic renal disease, a hypertension causing med or renovascular disease.  Erectile Dysfunction This is a chronic problem. The current episode started more than 1 year ago. The problem has been gradually improving since onset. The nature of his difficulty is achieving erection and maintaining erection. He reports no anxiety. Irritative symptoms do not include  frequency, nocturia or urgency. Obstructive symptoms do not include dribbling, incomplete emptying, an intermittent stream, a slower stream, straining or a weak stream. Pertinent negatives include no chills, dysuria or hematuria. Past treatments include sildenafil. The treatment provided moderate relief.  Thyroid Problem Presents for follow-up visit. Patient reports no anxiety, cold intolerance, constipation, depressed mood, diaphoresis, diarrhea, dry skin, fatigue, hair loss, heat intolerance, hoarse voice, leg swelling, nail problem, palpitations, tremors, visual change, weight gain or weight loss. The symptoms have been stable. There is no history of heart failure.    Lab Results  Component Value Date   CREATININE 1.54 (H) 07/08/2019   BUN 15 07/08/2019   NA 135 07/08/2019   K 3.5 07/08/2019   CL 102 07/08/2019   CO2 24 07/08/2019   Lab Results  Component Value Date   CHOL 220 (H) 03/31/2017   HDL 58 03/31/2017   LDLCALC 144 (H) 03/31/2017   TRIG 90 03/31/2017   CHOLHDL 3.8 03/31/2017   Lab Results  Component Value Date   TSH 0.391 07/08/2019   No results found for: HGBA1C Lab Results  Component Value Date   WBC 5.0 07/08/2019   HGB 15.5 07/08/2019   HCT 46.0 07/08/2019   MCV 92.6 07/08/2019   PLT 178 07/08/2019   Lab Results  Component Value Date   ALT 16 07/08/2019   AST 19 07/08/2019   ALKPHOS 48 07/08/2019   BILITOT 0.6 07/08/2019     Review of Systems  Constitutional: Negative for chills, diaphoresis, fatigue, fever, weight gain and weight loss.  HENT: Negative for drooling, ear discharge, ear pain, hoarse voice and sore throat.   Respiratory: Negative for cough, choking, shortness of breath and wheezing.   Cardiovascular: Negative for chest  pain, palpitations and leg swelling.  Gastrointestinal: Negative for abdominal pain, blood in stool, constipation, diarrhea, dysphagia, heartburn, melena and nausea.  Endocrine: Negative for cold intolerance, heat  intolerance and polydipsia.  Genitourinary: Negative for dysuria, frequency, hematuria, incomplete emptying, nocturia and urgency.  Musculoskeletal: Negative for back pain, myalgias, muscle weakness and neck pain.  Skin: Negative for rash.  Allergic/Immunologic: Negative for environmental allergies.  Neurological: Negative for dizziness, tremors and headaches.  Hematological: Does not bruise/bleed easily.  Psychiatric/Behavioral: Negative for suicidal ideas. The patient is not nervous/anxious.     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 06/22/2017   Goals of care, counseling/discussion 04/07/2017   CKD (chronic kidney disease) stage 3, GFR 30-59 ml/min 04/03/2017   Hypothyroid 03/31/2017   ED (erectile dysfunction) 03/31/2017   Gastroesophageal reflux disease 09/23/2016   Hypertension 09/23/2016   Squamous cell carcinoma of base of tongue ([place]) 08/04/2016    Allergies  Allergen Reactions   Ace Inhibitors Cough    Past Surgical History:  Procedure Laterality Date   tumor removed      Social History   Tobacco Use   Smoking status: Never Smoker   Smokeless tobacco: Never Used  Vaping Use   Vaping Use: Never used  Substance Use Topics   Alcohol use: No   Drug use: No     Medication list has been reviewed and updated.  Current Meds  Medication Sig   acetaminophen (TYLENOL) 650 MG CR tablet Take 1,300 mg by mouth every 8 (eight) hours.   amLODipine (NORVASC) 5 MG tablet Take 1 tablet (5 mg total) by mouth daily.   aspirin EC 81 MG tablet Take 81 mg by mouth daily.   esomeprazole (NEXIUM) 40 MG capsule Take 1 capsule (40  mg total) by mouth daily.   levothyroxine (SYNTHROID) 125 MCG tablet Take 1 tablet (125 mcg total) by mouth daily.   sildenafil (VIAGRA) 100 MG tablet Take 0.5-1 tablets (50-100 mg total) by mouth daily as needed for erectile dysfunction.    PHQ 2/9 Scores 09/03/2019 07/04/2019 03/05/2019 05/18/2018  PHQ - 2 Score 0 0 0 0  PHQ- 9 Score 0 0 0 1    GAD 7 : Generalized Anxiety Score 09/03/2019 07/04/2019 03/05/2019  Nervous, Anxious, on Edge 0 0 0  Control/stop worrying 0 0 0  Worry too much - different things 0 0 0  Trouble relaxing 0 0 0  Restless 0 0 0  Easily annoyed or irritable 0 0 0  Afraid - awful might happen 0 0 0  Total GAD 7 Score 0 0 0    BP Readings from Last 3 Encounters:  09/03/19 120/78  07/08/19 (!) 152/88  07/04/19 134/80    Physical Exam Vitals and nursing note reviewed.  HENT:     Head: Normocephalic.     Right Ear: Tympanic membrane and external ear normal. There is no impacted cerumen.     Left Ear: Tympanic membrane and external ear normal. There is no impacted cerumen.     Nose: Nose normal. No congestion or rhinorrhea.     Mouth/Throat:     Mouth: Mucous membranes are moist.  Eyes:     General: No  scleral icterus.       Right eye: No discharge.        Left eye: No discharge.     Conjunctiva/sclera: Conjunctivae normal.     Pupils: Pupils are equal, round, and reactive to light.  Neck:     Thyroid: No thyromegaly.     Vascular: No JVD.     Trachea: No tracheal deviation.  Cardiovascular:     Rate and Rhythm: Normal rate and regular rhythm.     Heart sounds: Normal heart sounds. No murmur heard.  No friction rub. No gallop.   Pulmonary:     Effort: No respiratory distress.     Breath sounds: Normal breath sounds. No wheezing, rhonchi or rales.  Abdominal:     General: Bowel sounds are normal.     Palpations: Abdomen is soft. There is no mass.     Tenderness: There is no abdominal tenderness. There is no guarding or rebound.  Musculoskeletal:         General: No tenderness. Normal range of motion.     Cervical back: Normal range of motion and neck supple.  Lymphadenopathy:     Cervical: No cervical adenopathy.  Skin:    General: Skin is warm.     Findings: No rash.  Neurological:     Mental Status: He is alert and oriented to person, place, and time.     Cranial Nerves: No cranial nerve deficit.     Deep Tendon Reflexes: Reflexes are normal and symmetric.     Wt Readings from Last 3 Encounters:  09/03/19 167 lb (75.8 kg)  07/08/19 172 lb 9.9 oz (78.3 kg)  07/04/19 172 lb (78 kg)    BP 120/78    Pulse 88    Ht 5\' 7"  (1.702 m)    Wt 167 lb (75.8 kg)    BMI 26.16 kg/m   Assessment and Plan:  1. Essential hypertension Chronic.  Controlled.  Stable.  Continue amlodipine 5 mg once a day.  Patient will be having CMP done with oncology and we will review labs at that time. - amLODipine (NORVASC) 5 MG tablet; Take 1 tablet (5 mg total) by mouth daily.  Dispense: 30 tablet; Refill: 5  2. Gastroesophageal reflux disease, unspecified whether esophagitis present Chronic.  Controlled.  Stable.  Continue Nexium 40 mg once a day. - esomeprazole (NEXIUM) 40 MG capsule; Take 1 capsule (40 mg total) by mouth daily.  Dispense: 30 capsule; Refill: 5  3. Postoperative hypothyroidism Chronic.  Controlled.  Stable.  Patient will be having TSH drawn at the time of his oncology appointment and that will be reviewed and levothyroxine will be adjust accordingly. - levothyroxine (SYNTHROID) 125 MCG tablet; Take 1 tablet (125 mcg total) by mouth daily.  Dispense: 30 tablet; Refill: 5  4. Erectile dysfunction due to arterial insufficiency Chronic.  Controlled.  Stable.  Continue sildenafil 100 mg 1/2 to 1 tablet as needed as needed - sildenafil (VIAGRA) 100 MG tablet; Take 0.5-1 tablets (50-100 mg total) by mouth daily as needed for erectile dysfunction.  Dispense: 5 tablet; Refill: 11  5. Colon cancer screening Discussed with patient and  referral to colonoscopy was made. - Ambulatory referral to Gastroenterology  6. Atherosclerosis of aorta ([facility]) Chronic.  Controlled.  Uncomplicated.  This will be controlled with optimizing diet.  We will also encourage weight loss.  Patient will also be optimizing lipid management as well as blood pressure control.

## 2019-09-11 ENCOUNTER — Other Ambulatory Visit: Payer: Self-pay

## 2019-09-11 ENCOUNTER — Telehealth (INDEPENDENT_AMBULATORY_CARE_PROVIDER_SITE_OTHER): Payer: Self-pay | Admitting: Gastroenterology

## 2019-09-11 DIAGNOSIS — Z1211 Encounter for screening for malignant neoplasm of colon: Secondary | ICD-10-CM

## 2019-09-11 MED ORDER — NA SULFATE-K SULFATE-MG SULF 17.5-3.13-1.6 GM/177ML PO SOLN: 1.0000 | Freq: Once | ORAL | 0 refills | Status: AC

## 2019-09-11 NOTE — Progress Notes (Signed)
Gastroenterology Pre-Procedure Review  Request Date: Monday 10/07/19 Requesting Physician: Dr. Allen Norris  PATIENT REVIEW QUESTIONS: The patient responded to the following health history questions as indicated:    1. Are you having any GI issues? no 2. Do you have a personal history of Polyps? no 3. Do you have a family history of Colon Cancer or Polyps? no 4. Diabetes Mellitus? no 5. Joint replacements in the past 12 months?no 6. Major health problems in the past 3 months?no 7. Any artificial heart valves, MVP, or defibrillator?no    MEDICATIONS & ALLERGIES:    Patient reports the following regarding taking any anticoagulation/antiplatelet therapy:   Plavix, Coumadin, Eliquis, Xarelto, Lovenox, Pradaxa, Brilinta, or Effient? no Aspirin? no  Patient confirms/reports the following medications:  Current Outpatient Medications  Medication Sig Dispense Refill  . acetaminophen (TYLENOL) 650 MG CR tablet Take 1,300 mg by mouth every 8 (eight) hours.    Marland Kitchen amLODipine (NORVASC) 5 MG tablet Take 1 tablet (5 mg total) by mouth daily. 30 tablet 5  . aspirin EC 81 MG tablet Take 81 mg by mouth daily.    Marland Kitchen esomeprazole (NEXIUM) 40 MG capsule Take 1 capsule (40 mg total) by mouth daily. 30 capsule 5  . levothyroxine (SYNTHROID) 125 MCG tablet Take 1 tablet (125 mcg total) by mouth daily. 30 tablet 5  . sildenafil (VIAGRA) 100 MG tablet Take 0.5-1 tablets (50-100 mg total) by mouth daily as needed for erectile dysfunction. 5 tablet 11  . Na Sulfate-K Sulfate-Mg Sulf 17.5-3.13-1.6 GM/177ML SOLN Take 1 kit by mouth once for 1 dose. 354 mL 0   No current facility-administered medications for this visit.    Patient confirms/reports the following allergies:  Allergies  Allergen Reactions  . Ace Inhibitors Cough    No orders of the defined types were placed in this encounter.   AUTHORIZATION INFORMATION Primary Insurance: 1D#: Group #:  Secondary Insurance: 1D#: Group #:  SCHEDULE  INFORMATION: Date:Monday 10/07/19  Time: Location:[facility]

## 2019-10-03 ENCOUNTER — Other Ambulatory Visit: Payer: Medicare Other

## 2019-10-07 ENCOUNTER — Ambulatory Visit: Admission: RE | Admit: 2019-10-07 | Payer: Medicare Other | Source: Home / Self Care | Admitting: Gastroenterology

## 2019-10-07 ENCOUNTER — Encounter: Admission: RE | Payer: Self-pay | Source: Home / Self Care

## 2019-10-07 SURGERY — COLONOSCOPY WITH PROPOFOL
Anesthesia: Choice

## 2019-12-16 ENCOUNTER — Ambulatory Visit: Payer: Medicare HMO

## 2019-12-23 ENCOUNTER — Other Ambulatory Visit: Payer: Self-pay

## 2019-12-23 ENCOUNTER — Ambulatory Visit (INDEPENDENT_AMBULATORY_CARE_PROVIDER_SITE_OTHER): Payer: Medicare HMO

## 2019-12-23 VITALS — BP 138/80 | HR 88 | Temp 98.1°F | Resp 16 | Ht 67.0 in | Wt 174.0 lb

## 2019-12-23 DIAGNOSIS — Z0000 Encounter for general adult medical examination without abnormal findings: Secondary | ICD-10-CM

## 2019-12-23 DIAGNOSIS — Z23 Encounter for immunization: Secondary | ICD-10-CM

## 2019-12-23 NOTE — Progress Notes (Signed)
Subjective:   Troy [name] is a 69 y.o. male who presents for an Initial Medicare Annual Wellness Visit.  Review of Systems     Cardiac Risk Factors include: advanced age (>9men, >35 women);male gender;hypertension     Objective:    Today's Vitals   12/23/19 0924  BP: 138/80  Pulse: 88  Resp: 16  Temp: 98.1 F (36.7 C)  TempSrc: Oral  SpO2: 98%  Weight: 174 lb (78.9 kg)  Height: 5\' 7"  (1.702 m)   Body mass index is 27.25 kg/m.  Advanced Directives 12/23/2019 07/08/2019 04/09/2019 01/03/2019 10/26/2018 10/05/2018 09/04/2018  Does Patient Have a Medical Advance Directive? No No No No No No No  Would patient like information on creating a medical advance directive? No - Patient declined No - Patient declined No - Patient declined No - Patient declined No - Patient declined No - Patient declined No - Patient declined    Current Medications (verified) Outpatient Encounter Medications as of 12/23/2019  Medication Sig   acetaminophen (TYLENOL) 650 MG CR tablet Take 1,300 mg by mouth every 8 (eight) hours.   amLODipine (NORVASC) 5 MG tablet Take 1 tablet (5 mg total) by mouth daily.   aspirin EC 81 MG tablet Take 81 mg by mouth daily.   esomeprazole (NEXIUM) 40 MG capsule Take 1 capsule (40 mg total) by mouth daily.   levothyroxine (SYNTHROID) 125 MCG tablet Take 1 tablet (125 mcg total) by mouth daily.   sildenafil (VIAGRA) 100 MG tablet Take 0.5-1 tablets (50-100 mg total) by mouth daily as needed for erectile dysfunction.   No facility-administered encounter medications on file as of 12/23/2019.    Allergies (verified) Ace inhibitors   History: Past Medical History:  Diagnosis Date   Cancer ([place])    GERD (gastroesophageal reflux disease)    Hypertension    Thyroid disease    Past Surgical History:  Procedure Laterality Date   tumor removed     Family History  Problem Relation Age of Onset   Cancer Mother    Cancer Sister    Cancer Maternal Aunt      Cancer Paternal Uncle    Social History   Socioeconomic History   Marital status: Single    Spouse name: Not on file   Number of children: 2   Years of education: Not on file   Highest education level: Not on file  Occupational History   Not on file  Tobacco Use   Smoking status: Never Smoker   Smokeless tobacco: Never Used  Vaping Use   Vaping Use: Never used  Substance and Sexual Activity   Alcohol use: No   Drug use: No   Sexual activity: Yes  Other Topics Concern   Not on file  Social History Narrative   Pt lives with his sister   Social Determinants of Health   Financial Resource Strain: Low Risk    Difficulty of Paying Living Expenses: Not very hard  Food Insecurity: No Food Insecurity   Worried About Charity fundraiser in the Last Year: Never true   [place] in the Last Year: Never true  Transportation Needs: No Transportation Needs   Lack of Transportation (Medical): No   Lack of Transportation (Non-Medical): No  Physical Activity: Inactive   Days of Exercise per Week: 0 days   Minutes of Exercise per Session: 0 min  Stress: No Stress Concern Present   Feeling of Stress : Not at all  Social Connections:  Socially Isolated   Frequency of Communication with Friends and Family: More than three times a week   Frequency of Social Gatherings with Friends and Family: More than three times a week   Attends Religious Services: Never   Marine scientist or Organizations: No   Attends Music therapist: Never   Marital Status: Divorced    Tobacco Counseling Counseling given: Not Answered   Clinical Intake:  Pre-visit preparation completed: Yes  Pain : No/denies pain     BMI - recorded: 27.25 Nutritional Status: BMI 25 -29 Overweight Nutritional Risks: None Diabetes: No  How often do you need to have someone help you when you read instructions, pamphlets, or other written materials from your doctor or  pharmacy?: 1 - Never    Interpreter Needed?: No  Information entered by :: Clemetine Marker LPN   Activities of Daily Living In your present state of health, do you have any difficulty performing the following activities: 12/23/2019  Hearing? N  Comment declines hearing aids  Vision? N  Difficulty concentrating or making decisions? N  Walking or climbing stairs? N  Dressing or bathing? N  Doing errands, shopping? N  Preparing Food and eating ? N  Using the Toilet? N  In the past six months, have you accidently leaked urine? N  Do you have problems with loss of bowel control? N  Managing your Medications? N  Managing your Finances? N  Housekeeping or managing your Housekeeping? N  Some recent data might be hidden    Patient Care Team: Juline Patch, MD as PCP - General (Family Medicine) Anthonette Legato, MD (Internal Medicine) Lequita Asal, MD as Referring Physician (Hematology and Oncology)  Indicate any recent Medical Services you may have received from other than [facility] providers in the past year (date may be approximate).     Assessment:   This is a routine wellness examination for Troy [name].  Hearing/Vision screen  Hearing Screening   125Hz  250Hz  500Hz  1000Hz  2000Hz  3000Hz  4000Hz  6000Hz  8000Hz   Right ear:           Left ear:           Comments: Pt denies hearing difficulty  Vision Screening Comments: Past due for eye exam; declines referral to ophthalmology  Dietary issues and exercise activities discussed: Current Exercise Habits: The patient does not participate in regular exercise at present, Exercise limited by: None identified  Goals     Increase physical activity     Recommend increasing physical activity to at least 3 times per week      Depression Screen PHQ 2/9 Scores 12/23/2019 09/03/2019 07/04/2019 03/05/2019 05/18/2018 03/31/2017  PHQ - 2 Score 0 0 0 0 0 0  PHQ- 9 Score - 0 0 0 1 -    Fall Risk Fall Risk  12/23/2019 09/03/2019 07/04/2019 03/05/2019  03/31/2017  Falls in the past year? 0 0 0 0 No  Number falls in past yr: 0 - - - -  Injury with Fall? 0 - - - -  Risk for fall due to : No Fall Risks - - - -  Follow up Falls prevention discussed Falls evaluation completed Falls evaluation completed Falls evaluation completed -    Any stairs in or around the home? Yes  If so, are there any without handrails? No  Home free of loose throw rugs in walkways, pet beds, electrical cords, etc? Yes  Adequate lighting in your home to reduce risk of falls? Yes  ASSISTIVE DEVICES UTILIZED TO PREVENT FALLS:  Life alert? No  Use of a cane, walker or w/c? No  Grab bars in the bathroom? No  Shower chair or bench in shower? No  Elevated toilet seat or a handicapped toilet? No   TIMED UP AND GO:  Was the test performed? Yes .  Length of time to ambulate 10 feet: 4 sec.   Gait steady and fast without use of assistive device  Cognitive Function:     6CIT Screen 12/23/2019  What Year? 0 points  What month? 0 points  What time? 0 points  Count back from 20 0 points  Months in reverse 4 points  Repeat phrase 4 points  Total Score 8    Immunizations Immunization History  Administered Date(s) Administered   Fluad Quad(high Dose 65+) 03/05/2019, 12/23/2019   Moderna SARS-COVID-2 Vaccination 04/17/2019, 05/15/2019   Pneumococcal Conjugate-13 03/31/2017   Tdap 03/31/2017    TDAP status: Up to date   Flu Vaccine status: Completed at today's visit   Pneumococcal vaccine status: due for PPSV23; plans to get at next visit.   Covid-19 vaccine status: Completed vaccines  Qualifies for Shingles Vaccine? Yes   Zostavax completed No   Shingrix Completed?: No.    Education has been provided regarding the importance of this vaccine. Patient has been advised to call insurance company to determine out of pocket expense if they have not yet received this vaccine. Advised may also receive vaccine at local pharmacy or Health Dept. Verbalized  acceptance and understanding.  Screening Tests Health Maintenance  Topic Date Due   PNA vac Low Risk Adult (2 of 2 - PPSV23) 03/31/2018   Hepatitis C Screening  03/04/2020 (Originally 1950/05/22)   COLONOSCOPY  02/14/2022   TETANUS/TDAP  04/01/2027   INFLUENZA VACCINE  Completed   COVID-19 Vaccine  Completed    Health Maintenance  Health Maintenance Due  Topic Date Due   PNA vac Low Risk Adult (2 of 2 - PPSV23) 03/31/2018    Colorectal cancer screening: Completed 2014. Repeat every 10 years  Lung Cancer Screening: (Low Dose CT Chest recommended if Age 66-80 years, 30 pack-year currently smoking OR have quit w/in 15years.) does not qualify.   Additional Screening:  Hepatitis C Screening: does qualify; postponed  Vision Screening: Recommended annual ophthalmology exams for early detection of glaucoma and other disorders of the eye. Is the patient up to date with their annual eye exam?  No  Who is the provider or what is the name of the office in which the patient attends annual eye exams? Not established If pt is not established with a provider, would they like to be referred to a provider to establish care? No  - declined  Dental Screening: Recommended annual dental exams for proper oral hygiene  Community Resource Referral / Chronic Care Management: CRR required this visit?  No   CCM required this visit?  No      Plan:     I have personally reviewed and noted the following in the patients chart:    Medical and social history  Use of alcohol, tobacco or illicit drugs   Current medications and supplements  Functional ability and status  Nutritional status  Physical activity  Advanced directives  List of other physicians  Hospitalizations, surgeries, and ER visits in previous 12 months  Vitals  Screenings to include cognitive, depression, and falls  Referrals and appointments  In addition, I have reviewed and discussed with patient certain  preventive  protocols, quality metrics, and best practice recommendations. A written personalized care plan for preventive services as well as general preventive health recommendations were provided to patient.     Clemetine Marker, LPN   20/10/4707   Nurse Notes: pt states he has a spot on inside of lower lip that is more irritating than painful; pt plans to discuss at next visit bc he would like it removed. Also plans to discuss with Dr. Mike Gip due to history of cancer of tongue.

## 2019-12-23 NOTE — Patient Instructions (Signed)
Troy [name] , Thank you for taking time to come for your Medicare Wellness Visit. I appreciate your ongoing commitment to your health goals. Please review the following plan we discussed and let me know if I can assist you in the future.   Screening recommendations/referrals: Colonoscopy: done 2014. Repeat in 2024.  Recommended yearly ophthalmology/optometry visit for glaucoma screening and checkup Recommended yearly dental visit for hygiene and checkup  Vaccinations: Influenza vaccine: done today Pneumococcal vaccine: done 03/31/17. Due for Pneumovax23 Tdap vaccine: done 03/31/17 Shingles vaccine: Shingrix discussed. Please contact your pharmacy for coverage information.  Covid-19: done 04/17/19 & 05/15/19  Advanced directives: Advance directive discussed with you today. Even though you declined this today please call our office should you change your mind and we can give you the proper paperwork for you to fill out.  Conditions/risks identified: Recommend increasing physical activity to at least 3 days per week  Next appointment: Follow up in one year for your annual wellness visit.   Preventive Care 69 Years and Older, Male Preventive care refers to lifestyle choices and visits with your health care provider that can promote health and wellness. What does preventive care include?  A yearly physical exam. This is also called an annual well check.  Dental exams once or twice a year.  Routine eye exams. Ask your health care provider how often you should have your eyes checked.  Personal lifestyle choices, including:  Daily care of your teeth and gums.  Regular physical activity.  Eating a healthy diet.  Avoiding tobacco and drug use.  Limiting alcohol use.  Practicing safe sex.  Taking low doses of aspirin every day.  Taking vitamin and mineral supplements as recommended by your health care provider. What happens during an annual well check? The services and screenings done  by your health care provider during your annual well check will depend on your age, overall health, lifestyle risk factors, and family history of disease. Counseling  Your health care provider may ask you questions about your:  Alcohol use.  Tobacco use.  Drug use.  Emotional well-being.  Home and relationship well-being.  Sexual activity.  Eating habits.  History of falls.  Memory and ability to understand (cognition).  Work and work Statistician. Screening  You may have the following tests or measurements:  Height, weight, and BMI.  Blood pressure.  Lipid and cholesterol levels. These may be checked every 5 years, or more frequently if you are over 100 years old.  Skin check.  Lung cancer screening. You may have this screening every year starting at age 69 if you have a 30-pack-year history of smoking and currently smoke or have quit within the past 15 years.  Fecal occult blood test (FOBT) of the stool. You may have this test every year starting at age 54.  Flexible sigmoidoscopy or colonoscopy. You may have a sigmoidoscopy every 5 years or a colonoscopy every 10 years starting at age 30.  Prostate cancer screening. Recommendations will vary depending on your family history and other risks.  Hepatitis C blood test.  Hepatitis B blood test.  Sexually transmitted disease (STD) testing.  Diabetes screening. This is done by checking your blood sugar (glucose) after you have not eaten for a while (fasting). You may have this done every 1-3 years.  Abdominal aortic aneurysm (AAA) screening. You may need this if you are a current or former smoker.  Osteoporosis. You may be screened starting at age 69 if you are at high risk.  Talk with your health care provider about your test results, treatment options, and if necessary, the need for more tests. Vaccines  Your health care provider may recommend certain vaccines, such as:  Influenza vaccine. This is recommended every  year.  Tetanus, diphtheria, and acellular pertussis (Tdap, Td) vaccine. You may need a Td booster every 10 years.  Zoster vaccine. You may need this after age 42.  Pneumococcal 13-valent conjugate (PCV13) vaccine. One dose is recommended after age 69.  Pneumococcal polysaccharide (PPSV23) vaccine. One dose is recommended after age 69. Talk to your health care provider about which screenings and vaccines you need and how often you need them. This information is not intended to replace advice given to you by your health care provider. Make sure you discuss any questions you have with your health care provider. Document Released: 02/27/2015 Document Revised: 10/21/2015 Document Reviewed: 12/02/2014 Elsevier Interactive Patient Education  2017 [place] Prevention in the Home Falls can cause injuries. They can happen to people of all ages. There are many things you can do to make your home safe and to help prevent falls. What can I do on the outside of my home?  Regularly fix the edges of walkways and driveways and fix any cracks.  Remove anything that might make you trip as you walk through a door, such as a raised step or threshold.  Trim any bushes or trees on the path to your home.  Use bright outdoor lighting.  Clear any walking paths of anything that might make someone trip, such as rocks or tools.  Regularly check to see if handrails are loose or broken. Make sure that both sides of any steps have handrails.  Any raised decks and porches should have guardrails on the edges.  Have any leaves, snow, or ice cleared regularly.  Use sand or salt on walking paths during winter.  Clean up any spills in your garage right away. This includes oil or grease spills. What can I do in the bathroom?  Use night lights.  Install grab bars by the toilet and in the tub and shower. Do not use towel bars as grab bars.  Use non-skid mats or decals in the tub or shower.  If you  need to sit down in the shower, use a plastic, non-slip stool.  Keep the floor dry. Clean up any water that spills on the floor as soon as it happens.  Remove soap buildup in the tub or shower regularly.  Attach bath mats securely with double-sided non-slip rug tape.  Do not have throw rugs and other things on the floor that can make you trip. What can I do in the bedroom?  Use night lights.  Make sure that you have a light by your bed that is easy to reach.  Do not use any sheets or blankets that are too big for your bed. They should not hang down onto the floor.  Have a firm chair that has side arms. You can use this for support while you get dressed.  Do not have throw rugs and other things on the floor that can make you trip. What can I do in the kitchen?  Clean up any spills right away.  Avoid walking on wet floors.  Keep items that you use a lot in easy-to-reach places.  If you need to reach something above you, use a strong step stool that has a grab bar.  Keep electrical cords out of the way.  Do not use floor polish or wax that makes floors slippery. If you must use wax, use non-skid floor wax.  Do not have throw rugs and other things on the floor that can make you trip. What can I do with my stairs?  Do not leave any items on the stairs.  Make sure that there are handrails on both sides of the stairs and use them. Fix handrails that are broken or loose. Make sure that handrails are as long as the stairways.  Check any carpeting to make sure that it is firmly attached to the stairs. Fix any carpet that is loose or worn.  Avoid having throw rugs at the top or bottom of the stairs. If you do have throw rugs, attach them to the floor with carpet tape.  Make sure that you have a light switch at the top of the stairs and the bottom of the stairs. If you do not have them, ask someone to add them for you. What else can I do to help prevent falls?  Wear shoes  that:  Do not have high heels.  Have rubber bottoms.  Are comfortable and fit you well.  Are closed at the toe. Do not wear sandals.  If you use a stepladder:  Make sure that it is fully opened. Do not climb a closed stepladder.  Make sure that both sides of the stepladder are locked into place.  Ask someone to hold it for you, if possible.  Clearly mark and make sure that you can see:  Any grab bars or handrails.  First and last steps.  Where the edge of each step is.  Use tools that help you move around (mobility aids) if they are needed. These include:  Canes.  Walkers.  Scooters.  Crutches.  Turn on the lights when you go into a dark area. Replace any light bulbs as soon as they burn out.  Set up your furniture so you have a clear path. Avoid moving your furniture around.  If any of your floors are uneven, fix them.  If there are any pets around you, be aware of where they are.  Review your medicines with your doctor. Some medicines can make you feel dizzy. This can increase your chance of falling. Ask your doctor what other things that you can do to help prevent falls. This information is not intended to replace advice given to you by your health care provider. Make sure you discuss any questions you have with your health care provider. Document Released: 11/27/2008 Document Revised: 07/09/2015 Document Reviewed: 03/07/2014 Elsevier Interactive Patient Education  2017 [employer].

## 2020-01-07 ENCOUNTER — Encounter: Payer: Self-pay | Admitting: Family Medicine

## 2020-01-07 ENCOUNTER — Other Ambulatory Visit: Payer: Self-pay

## 2020-01-07 ENCOUNTER — Ambulatory Visit (INDEPENDENT_AMBULATORY_CARE_PROVIDER_SITE_OTHER): Payer: Medicare HMO | Admitting: Family Medicine

## 2020-01-07 VITALS — BP 122/72 | HR 80 | Ht 67.0 in | Wt 173.0 lb

## 2020-01-07 DIAGNOSIS — I10 Essential (primary) hypertension: Secondary | ICD-10-CM

## 2020-01-07 DIAGNOSIS — N5201 Erectile dysfunction due to arterial insufficiency: Secondary | ICD-10-CM

## 2020-01-07 DIAGNOSIS — K1329 Other disturbances of oral epithelium, including tongue: Secondary | ICD-10-CM | POA: Diagnosis not present

## 2020-01-07 DIAGNOSIS — E890 Postprocedural hypothyroidism: Secondary | ICD-10-CM

## 2020-01-07 DIAGNOSIS — K219 Gastro-esophageal reflux disease without esophagitis: Secondary | ICD-10-CM

## 2020-01-07 MED ORDER — LEVOTHYROXINE SODIUM 125 MCG PO TABS: 125.0000 ug | ORAL_TABLET | Freq: Every day | ORAL | 5 refills | Status: DC

## 2020-01-07 MED ORDER — ESOMEPRAZOLE MAGNESIUM 40 MG PO CPDR: 40.0000 mg | DELAYED_RELEASE_CAPSULE | Freq: Every day | ORAL | 5 refills | Status: DC

## 2020-01-07 MED ORDER — AMLODIPINE BESYLATE 5 MG PO TABS: 5.0000 mg | ORAL_TABLET | Freq: Every day | ORAL | 5 refills | Status: DC

## 2020-01-07 NOTE — Progress Notes (Signed)
Date:  01/07/2020   Name:  Troy [name]   DOB:  1950/04/04   MRN:  [id number]   Chief Complaint: Gastroesophageal Reflux, Hypertension, Hypothyroidism, and Erectile Dysfunction  Gastroesophageal Reflux He complains of tooth decay. He reports no abdominal pain, no belching, no chest pain, no choking, no coughing, no dysphagia, no early satiety, no globus sensation, no heartburn, no hoarse voice, no nausea, no sore throat, no stridor or no wheezing. This is a chronic problem. The problem occurs rarely. The problem has been gradually improving. The symptoms are aggravated by certain foods. Pertinent negatives include no anemia, fatigue, melena, muscle weakness, orthopnea or weight loss. He has tried a PPI for the symptoms. The treatment provided moderate relief.  Hypertension This is a chronic problem. The current episode started more than 1 year ago. The problem has been waxing and waning since onset. The problem is controlled. Pertinent negatives include no anxiety, blurred vision, chest pain, headaches, malaise/fatigue, neck pain, orthopnea, palpitations, peripheral edema, PND, shortness of breath or sweats. There are no associated agents to hypertension. Past treatments include calcium channel blockers. The current treatment provides moderate improvement. There are no compliance problems.  There is no history of angina, kidney disease, CAD/MI, CVA, heart failure, left ventricular hypertrophy, PVD or retinopathy. Identifiable causes of hypertension include a thyroid problem. There is no history of chronic renal disease, a hypertension causing med or renovascular disease.  Erectile Dysfunction The problem has been gradually improving since onset. The nature of his difficulty is achieving erection and maintaining erection. He reports no anxiety. Irritative symptoms do not include frequency or urgency. Pertinent negatives include no chills, dysuria or hematuria. Past treatments include sildenafil.  The treatment provided moderate relief.  Thyroid Problem Presents for follow-up visit. Patient reports no anxiety, cold intolerance, constipation, depressed mood, diaphoresis, diarrhea, dry skin, fatigue, hair loss, heat intolerance, hoarse voice, leg swelling, nail problem, palpitations, tremors, visual change, weight gain or weight loss. The symptoms have been stable. There is no history of heart failure.    Lab Results  Component Value Date   CREATININE 1.54 (H) 07/08/2019   BUN 15 07/08/2019   NA 135 07/08/2019   K 3.5 07/08/2019   CL 102 07/08/2019   CO2 24 07/08/2019   Lab Results  Component Value Date   CHOL 220 (H) 03/31/2017   HDL 58 03/31/2017   LDLCALC 144 (H) 03/31/2017   TRIG 90 03/31/2017   CHOLHDL 3.8 03/31/2017   Lab Results  Component Value Date   TSH 0.391 07/08/2019   No results found for: HGBA1C Lab Results  Component Value Date   WBC 5.0 07/08/2019   HGB 15.5 07/08/2019   HCT 46.0 07/08/2019   MCV 92.6 07/08/2019   PLT 178 07/08/2019   Lab Results  Component Value Date   ALT 16 07/08/2019   AST 19 07/08/2019   ALKPHOS 48 07/08/2019   BILITOT 0.6 07/08/2019     Review of Systems  Constitutional: Negative for chills, diaphoresis, fatigue, fever, malaise/fatigue, weight gain and weight loss.  HENT: Negative for drooling, ear discharge, ear pain, hoarse voice and sore throat.   Eyes: Negative for blurred vision.  Respiratory: Negative for cough, choking, shortness of breath and wheezing.   Cardiovascular: Negative for chest pain, palpitations, orthopnea, leg swelling and PND.  Gastrointestinal: Negative for abdominal pain, blood in stool, constipation, diarrhea, dysphagia, heartburn, melena and nausea.  Endocrine: Negative for cold intolerance, heat intolerance and polydipsia.  Genitourinary: Negative for dysuria, frequency,  hematuria and urgency.  Musculoskeletal: Negative for back pain, myalgias, muscle weakness and neck pain.  Skin: Negative  for rash.  Allergic/Immunologic: Negative for environmental allergies.  Neurological: Negative for dizziness, tremors and headaches.  Hematological: Does not bruise/bleed easily.  Psychiatric/Behavioral: Negative for suicidal ideas. The patient is not nervous/anxious.     Patient Active Problem List   Diagnosis Date Noted  . Hypokalemia 04/09/2019  . Hypocalcemia 01/03/2019  . Abnormal PET scan of head 10/26/2018  . Low TSH level 09/16/2018  . Renal insufficiency 07/09/2018  . Atherosclerosis of aorta ([place]) 06/27/2018  . Encounter for antineoplastic immunotherapy 05/18/2018  . Erythrocytosis 04/27/2018  . 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 Goals of care, counseling/discussion 04/07/2017  . Stage 3 chronic kidney disease ([place]) 04/03/2017  . Hypothyroid 03/31/2017  . ED (erectile dysfunction) 03/31/2017  . Gastroesophageal reflux disease 09/23/2016  . Essential hypertension 09/23/2016  . Squamous cell carcinoma of base of tongue ([facility]) 08/04/2016    Allergies  Allergen Reactions  . Ace Inhibitors Cough    Past Surgical History:  Procedure Laterality Date  . tumor removed      Social History   Tobacco Use  . Smoking status: Never Smoker  . Smokeless tobacco: Never Used  Vaping Use  . Vaping Use: Never used  Substance Use Topics  . Alcohol use: No  . Drug use: No     Medication list has been reviewed and updated.  Current Meds  Medication Sig  . acetaminophen (TYLENOL) 650 MG CR tablet Take 1,300 mg by mouth every 8 (eight) hours.  Marland Kitchen amLODipine (NORVASC) 5 MG tablet Take 1 tablet (5 mg total) by mouth daily.  Marland Kitchen aspirin EC 81 MG tablet Take 81 mg by mouth daily.  Marland Kitchen esomeprazole (NEXIUM) 40 MG capsule Take 1 capsule (40 mg total) by mouth daily.  Marland Kitchen levothyroxine (SYNTHROID) 125 MCG tablet Take 1 tablet (125 mcg total) by mouth daily.  . sildenafil (VIAGRA) 100 MG tablet Take 0.5-1 tablets (50-100 mg total) by mouth daily as needed for erectile  dysfunction.    PHQ 2/9 Scores 01/07/2020 12/23/2019 09/03/2019 07/04/2019  PHQ - 2 Score 0 0 0 0  PHQ- 9 Score 0 - 0 0    GAD 7 : Generalized Anxiety Score 01/07/2020 09/03/2019 07/04/2019 03/05/2019  Nervous, Anxious, on Edge 0 0 0 0  Control/stop worrying 0 0 0 0  Worry too much - different things 0 0 0 0  Trouble relaxing 0 0 0 0  Restless 0 0 0 0  Easily annoyed or irritable 0 0 0 0  Afraid - awful might happen 0 0 0 0  Total GAD 7 Score 0 0 0 0    BP Readings from Last 3 Encounters:  01/07/20 122/72  12/23/19 138/80  09/03/19 120/78    Physical Exam Vitals and nursing note reviewed.  HENT:     Head: Normocephalic.     Right Ear: Tympanic membrane, ear canal and external ear normal.     Left Ear: Tympanic membrane, ear canal and external ear normal.     Nose: Nose normal. No congestion or rhinorrhea.     Mouth/Throat:     Lips: Pink. Lesions present.     Mouth: Mucous membranes are moist.     Comments: Wart like growth right lower lip Eyes:     General: No scleral icterus.       Right eye: No discharge.        Left eye: No  discharge.     Conjunctiva/sclera: Conjunctivae normal.     Pupils: Pupils are equal, round, and reactive to light.  Neck:     Thyroid: No thyromegaly.     Vascular: No JVD.     Trachea: No tracheal deviation.  Cardiovascular:     Rate and Rhythm: Normal rate and regular rhythm.     Heart sounds: Normal heart sounds. No murmur heard.  No friction rub. No gallop.   Pulmonary:     Effort: No respiratory distress.     Breath sounds: Normal breath sounds. No wheezing or rales.  Abdominal:     General: Bowel sounds are normal.     Palpations: Abdomen is soft. There is no mass.     Tenderness: There is no abdominal tenderness. There is no guarding or rebound.  Musculoskeletal:        General: No tenderness. Normal range of motion.     Cervical back: Normal range of motion and neck supple.  Lymphadenopathy:     Cervical: No cervical adenopathy.   Skin:    General: Skin is warm.     Findings: No rash.  Neurological:     Mental Status: He is alert and oriented to person, place, and time.     Cranial Nerves: No cranial nerve deficit.     Deep Tendon Reflexes: Reflexes are normal and symmetric.     Wt Readings from Last 3 Encounters:  01/07/20 173 lb (78.5 kg)  12/23/19 174 lb (78.9 kg)  09/03/19 167 lb (75.8 kg)    BP 122/72   Pulse 80   Ht 5\' 7"  (1.702 m)   Wt 173 lb (78.5 kg)   BMI 27.10 kg/m   Assessment and Plan:  1. Essential hypertension Chronic.  Controlled.  Stable.  Blood pressure 122/72.  We will continue amlodipine 5 mg once a day. - amLODipine (NORVASC) 5 MG tablet; Take 1 tablet (5 mg total) by mouth daily.  Dispense: 30 tablet; Refill: 5  2. Gastroesophageal reflux disease, unspecified whether esophagitis present Chronic.  Controlled.  Stable.  Continue Nexium 40 mg once a day.  We will recheck patient in 6 months. - esomeprazole (NEXIUM) 40 MG capsule; Take 1 capsule (40 mg total) by mouth daily.  Dispense: 30 capsule; Refill: 5  3. Postoperative hypothyroidism Chronic.  Controlled.  Stable.  Reviewed patient's previous lab values and TSH is in acceptable range.  We will continue levothyroxine 125 mcg daily. - levothyroxine (SYNTHROID) 125 MCG tablet; Take 1 tablet (125 mcg total) by mouth daily.  Dispense: 30 tablet; Refill: 5  4. Erectile dysfunction due to arterial insufficiency Chronic.  Controlled.  Stable.  We will continue sildenafil 100 mg 1 1 a day as needed.  5. Hyperkeratotic oral lesion New onset to [place].  Persistent lesion.  Patient has been seen by [facility] and referred to oral surgery patient was confused as to the referral process.  [facility] was called and patient is to call them for an appointment for evaluation and treatment of a hyperkeratotic oral lesion on the right lower lip. I spent 30 minutes with this patient, More than 50% of that time was spent in face to face  education, counseling and care coordination.

## 2020-01-12 NOTE — Progress Notes (Signed)
[facility]  [address] [place], [place] [address] Phone: [phone]  Fax: [phone]   Clinic Day:  01/13/2020  Referring physician: Juline Patch, MD  Chief Complaint: Troy [name] is a 69 y.o. 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   HPI: The patient was last seen in the medical oncology clinic on 07/08/2019. At that time, he was doing well.  Skin has healed following an episode of shingles. Hematocrit was 46.0, hemoglobin 15.5, platelets 178,000, WBC 5,000. Creatinine was 1.54 (CrCl 53 ml/min). Calcium was 8.4. TSH was 0.391. We discussed continued surveillance.  During the interim, he has been "so-so." He needs a biopsy of a spot on his bottom lip. He is going to be getting dentures. He still needs to have teeth extracted.  Otherwise, he is doing great. His dry mouth has resolved. His shingles has resolved. He denies nausea, vomiting, and diarrhea.   Past Medical History:  Diagnosis Date  . Cancer (Rudy)   . GERD (gastroesophageal reflux disease)   . Hypertension   . Thyroid disease     Past Surgical History:  Procedure Laterality Date  . tumor removed      Family History  Problem Relation Age of Onset  . Cancer Mother   . Cancer Sister   . Cancer Maternal Aunt   . Cancer Paternal Uncle     Social History:  reports that he has never smoked. He has never used smokeless tobacco. He reports that he does not drink alcohol and does not use drugs.  He lives in [place].He has been helping his sister with yard work.He is currently staying with his sister The patient is alone today.  Allergies:  Allergies  Allergen Reactions  . Ace Inhibitors Cough    Current Medications: Current Outpatient Medications  Medication Sig Dispense Refill  . acetaminophen (TYLENOL) 650 MG CR tablet Take 1,300 mg by mouth every 8 (eight) hours.    Marland Kitchen amLODipine (NORVASC) 5 MG tablet  Take 1 tablet (5 mg total) by mouth daily. 30 tablet 5  . aspirin EC 81 MG tablet Take 81 mg by mouth daily.    Marland Kitchen esomeprazole (NEXIUM) 40 MG capsule Take 1 capsule (40 mg total) by mouth daily. 30 capsule 5  . levothyroxine (SYNTHROID) 125 MCG tablet Take 1 tablet (125 mcg total) by mouth daily. 30 tablet 5  . sildenafil (VIAGRA) 100 MG tablet Take 0.5-1 tablets (50-100 mg total) by mouth daily as needed for erectile dysfunction. 5 tablet 11   No current facility-administered medications for this visit.    Review of Systems  Constitutional: Positive for weight loss (1 lb). Negative for chills, diaphoresis, fever and malaise/fatigue.       Feels "so-so."  HENT: Negative for congestion, ear discharge, ear pain, hearing loss, nosebleeds, sinus pain, sore throat and tinnitus.        Needs teeth extracted. Needs biopsy for spot on bottom lip.  Eyes: Negative.  Negative for blurred vision, double vision and photophobia.  Respiratory: Negative.  Negative for cough, hemoptysis, sputum production and shortness of breath.   Cardiovascular: Negative.  Negative for chest pain, palpitations, orthopnea and leg swelling.  Gastrointestinal: Negative.  Negative for abdominal pain, blood in stool, constipation, diarrhea, heartburn, melena, nausea and vomiting.  Genitourinary: Negative.  Negative for dysuria, frequency, hematuria and urgency.  Musculoskeletal: Negative.  Negative for back pain, joint pain, myalgias and neck pain.  Skin: Negative.  Negative for itching and rash.  Neurological: Negative.  Negative for dizziness, tingling, sensory change, speech change, focal weakness, weakness and headaches.  Endo/Heme/Allergies: Negative for environmental allergies. Does not bruise/bleed easily.  Psychiatric/Behavioral: Negative.  Negative for depression and memory loss. The patient is not nervous/anxious and does not have insomnia.   All other systems reviewed and are negative.   Performance status (ECOG):   0  Vitals  Blood pressure (!) 141/88, pulse 79, temperature 98.6 F (37 C), temperature source Oral, resp. rate 16, weight 171 lb 15.3 oz (78 kg).  Physical Exam Vitals and nursing note reviewed.  Constitutional:      General: He is not in acute distress.    Appearance: He is well-developed. He is not diaphoretic.  HENT:     Head: Normocephalic and atraumatic.     Mouth/Throat:     Mouth: Mucous membranes are moist.     Pharynx: Oropharynx is clear. No oropharyngeal exudate.     Comments: Upper denture. He has 5-6 bottom teeth. Lower lip lesion (see photo). Eyes:     General: No scleral icterus.    Extraocular Movements: Extraocular movements intact.     Conjunctiva/sclera: Conjunctivae normal.     Pupils: Pupils are equal, round, and reactive to light.  Cardiovascular:     Rate and Rhythm: Normal rate and regular rhythm.     Heart sounds: Normal heart sounds. No murmur heard.   Pulmonary:     Effort: Pulmonary effort is normal. No respiratory distress.     Breath sounds: Normal breath sounds. No wheezing or rales.  Chest:     Chest wall: No tenderness.  Breasts:     Right: No supraclavicular adenopathy.     Left: No supraclavicular adenopathy.    Abdominal:     General: Bowel sounds are normal. There is no distension.     Palpations: Abdomen is soft. There is no mass.     Tenderness: There is no abdominal tenderness. There is no guarding or rebound.  Musculoskeletal:        General: No tenderness. Normal range of motion.     Cervical back: Normal range of motion and neck supple.  Lymphadenopathy:     Cervical: No cervical adenopathy.     Upper Body:     Right upper body: No supraclavicular adenopathy.     Left upper body: No supraclavicular adenopathy.  Skin:    General: Skin is warm and dry.     Comments: Scarring on right shoulder and chest after shingles.  Neurological:     Mental Status: He is alert and oriented to person, place, and time.  Psychiatric:         Behavior: Behavior normal.        Thought Content: Thought content normal.        Judgment: Judgment normal.    01/13/2020     Appointment on 01/13/2020  Component Date Value Ref Range Status  . Sodium 01/13/2020 137  135 - 145 mmol/L Final  . Potassium 01/13/2020 4.7  3.5 - 5.1 mmol/L Final  . Chloride 01/13/2020 98  98 - 111 mmol/L Final  . CO2 01/13/2020 31  22 - 32 mmol/L Final  . Glucose, Bld 01/13/2020 121* 70 - 99 mg/dL Final   Glucose reference range applies only to samples taken after fasting for at least 8 hours.  . BUN 01/13/2020 19  8 - 23 mg/dL Final  . Creatinine, Ser 01/13/2020 1.64* 0.61 - 1.24  mg/dL Final  . Calcium 01/13/2020 9.0  8.9 - 10.3 mg/dL Final  . Total Protein 01/13/2020 7.4  6.5 - 8.1 g/dL Final  . Albumin 01/13/2020 4.0  3.5 - 5.0 g/dL Final  . AST 01/13/2020 19  15 - 41 U/L Final  . ALT 01/13/2020 18  0 - 44 U/L Final  . Alkaline Phosphatase 01/13/2020 44  38 - 126 U/L Final  . Total Bilirubin 01/13/2020 0.8  0.3 - 1.2 mg/dL Final  . GFR, Estimated 01/13/2020 45* >60 mL/min Final   Comment: (NOTE) Calculated using the CKD-EPI Creatinine Equation (2021)   . Anion gap 01/13/2020 8  5 - 15 Final   Performed at [facility], [address] [place], [place] [address]  . WBC 01/13/2020 6.0  4.0 - 10.5 K/uL Final  . RBC 01/13/2020 5.28  4.22 - 5.81 MIL/uL Final  . Hemoglobin 01/13/2020 16.2  13.0 - 17.0 g/dL Final  . HCT 01/13/2020 48.4  39 - 52 % Final  . MCV 01/13/2020 91.7  80.0 - 100.0 fL Final  . MCH 01/13/2020 30.7  26.0 - 34.0 pg Final  . MCHC 01/13/2020 33.5  30.0 - 36.0 g/dL Final  . RDW 01/13/2020 12.7  11.5 - 15.5 % Final  . Platelets 01/13/2020 237  150 - 400 K/uL Final  . nRBC 01/13/2020 0.0  0.0 - 0.2 % Final  . Neutrophils Relative % 01/13/2020 57  % Final  . Neutro Abs 01/13/2020 3.5  1.7 - 7.7 K/uL Final  . Lymphocytes Relative 01/13/2020 25  % Final  . Lymphs Abs 01/13/2020 1.5  0.7 - 4.0 K/uL Final  .  Monocytes Relative 01/13/2020 13  % Final  . Monocytes Absolute 01/13/2020 0.8  0.1 - 1.0 K/uL Final  . Eosinophils Relative 01/13/2020 3  % Final  . Eosinophils Absolute 01/13/2020 0.2  0.0 - 0.5 K/uL Final  . Basophils Relative 01/13/2020 1  % Final  . Basophils Absolute 01/13/2020 0.1  0.0 - 0.1 K/uL Final  . Immature Granulocytes 01/13/2020 1  % Final  . Abs Immature Granulocytes 01/13/2020 0.04  0.00 - 0.07 K/uL Final   Performed at [facility], [address], [place], [place] [address]    Assessment:  RETT STEHLIK is a 69 y.o. male with recurrent base of tongue carcinoma. Clinical stagewasT3 N3 M0, p16+ in 11/2014. He received concurrent cisplatin and radiationfrom 03/2015 - 05/21/2015.   PET scanon 09/02/2015 was equivocal. PET scanon 12/07/2015 revealed abnormal FDG uptake (reactive from prior radiation therapy).   PET scanon 07/05/2016 revealed an enlarging level 2 lymph node (increasing SUV).   PET scanon 04/12/2017 revealed no evidence of hypermetabolic local recurrence in the oral cavity or metastatic disease within the neck, chest, abdomen or pelvis. There was no suspicious metabolic activity.   Biopsyin 07/2017 confirmed recurrence. He had several delays in treatment but was scheduled to have surgery. Unfortunately, he was found that recurrence was inoperable.   Based onKEYNOTE-048 trial,he initiated palliative Keytruda (pembrolizumab)every 3 weeks on09/13/2018(last 10/26/2018).He will plan for 2 years of treatment any toxicity.  Neck and chest CTon 04/03/2018 revealed no evidence of residual or recurrent disease. He has chronic posttreatment changes of the tongue. Chronic scarring and right parotid and right neck.  PET scanon08/06/2020was independently reviewed and I agree with radiologist findings which showno specific findings suggestive of local recurrence or distant metastatic disease. There was a new asymmetric focus of  increased uptake posterior to left mandibular condyle.Soft tissue  neck MRIon 10/19/2018 revealed no anatomic explanation for the uptake posterior to the left mandibular condyle.There was nomass or metastatic disease identified by MRI.  He has renal insufficiency.Baseline creatininehas been1.62 - 1.81 in past year.Urinalysis on 07/04/2018 revealed no proteinuria or hematuria.  He has erythrocytosis. Hemoglobin was 16.7 on 04/06/2018. Work-upon 04/27/2018 revealed the followingnormal/negative tests: JAK2 V617F, exon 12-15, CALR, MPL, epo level (13.3). Carbon monoxide was 3.9% (0-3.6%)with repeat 3.0% on 07/04/2018. Testosteronewas normal on 07/04/2018. He does not smoke. No formal diagnosis of sleep apnea but symptomatic. Encouraged him to discuss sleep testing with his PCP.  Symptomatically, he has been "so-so."  He is going to be getting dentures; he needs to have teeth extracted.  His dry mouth has resolved. Exam reveals a lower lip lesion.  Plan: 1.   Labs today: CBC with diff, CMP, TSH, free T4. 2.Recurrent base of tongue carcinoma Clinically, he continues to do well. He completed 2 years of pembrolizumab based on theKeynote-048 trial on 10/26/2018. PETscanon 08/06/2020and soft tissue neck MRIon 10/19/2018 revealed: Focus of increased uptake in the left mandibular condyle (unclear significance). No abnormality seen on soft tissue MRI. Exam remains unremarkable. Follow-up with ENT for ongoing surveillance. Continue surveillance in the oncology clinic every 6 months. 3.Renal insufficiency Creatinine 1.64.    He has chronic renal insufficiency.  Baseline creatinine 1.62 - 1.81 in past year.             Continue to monitor. 4.Hypothyroidism TSH 0.324 with a free T4 of 1.16 today. Patient on levothyroxine 125 mcg a day.  Continue to monitor. 5.Erythrocytosis, improved Hematocrit  48.4. Hemoglobin  16.2. JAK2 with reflex, carbon monoxide, epo level, and testosterone were normal. Continue to monitor. 6.Hypocalcemia, resolved             Calcium 9.0.             Continue supplemental calcium. 7.   Lip lesion   Etiology unclear.  Consult ENT (Dr Kathyrn Sheriff). 8.   RTC in 6 months for MD assessment and labs (CBC with diff, CMP, TSH, free T4).  I discussed the assessment and treatment plan with the patient.  The patient was provided an opportunity to ask questions and all were answered.  The patient agreed with the plan and demonstrated an understanding of the instructions.  The patient was advised to call back or seek an in person evaluation if the symptoms worsen or if the condition fails to improve as anticipated.   Nolon Stalls, MD, PhD  01/13/2020, 12:15 PM  I, Mirian Mo Tufford, am acting as Education administrator for [employer]. Mike Gip, MD, PhD.  I, Arine Foley C. Mike Gip, MD, have reviewed the above documentation for accuracy and completeness, and I agree with the above.

## 2020-01-13 ENCOUNTER — Other Ambulatory Visit: Payer: Self-pay

## 2020-01-13 ENCOUNTER — Encounter: Payer: Self-pay | Admitting: Hematology and Oncology

## 2020-01-13 ENCOUNTER — Inpatient Hospital Stay (HOSPITAL_BASED_OUTPATIENT_CLINIC_OR_DEPARTMENT_OTHER): Payer: Medicare HMO | Admitting: Hematology and Oncology

## 2020-01-13 ENCOUNTER — Inpatient Hospital Stay: Payer: Medicare HMO | Attending: Hematology and Oncology

## 2020-01-13 VITALS — BP 141/88 | HR 79 | Temp 98.6°F | Resp 16 | Wt 172.0 lb

## 2020-01-13 DIAGNOSIS — E039 Hypothyroidism, unspecified: Secondary | ICD-10-CM

## 2020-01-13 DIAGNOSIS — C01 Malignant neoplasm of base of tongue: Secondary | ICD-10-CM | POA: Insufficient documentation

## 2020-01-13 DIAGNOSIS — D751 Secondary polycythemia: Secondary | ICD-10-CM | POA: Diagnosis not present

## 2020-01-13 DIAGNOSIS — E876 Hypokalemia: Secondary | ICD-10-CM

## 2020-01-13 DIAGNOSIS — K130 Diseases of lips: Secondary | ICD-10-CM

## 2020-01-13 DIAGNOSIS — N289 Disorder of kidney and ureter, unspecified: Secondary | ICD-10-CM

## 2020-01-13 DIAGNOSIS — N183 Chronic kidney disease, stage 3 unspecified: Secondary | ICD-10-CM | POA: Diagnosis not present

## 2020-01-13 LAB — COMPREHENSIVE METABOLIC PANEL
ALT: 18 U/L (ref 0–44)
AST: 19 U/L (ref 15–41)
Albumin: 4 g/dL (ref 3.5–5.0)
Alkaline Phosphatase: 44 U/L (ref 38–126)
Anion gap: 8 (ref 5–15)
BUN: 19 mg/dL (ref 8–23)
CO2: 31 mmol/L (ref 22–32)
Calcium: 9 mg/dL (ref 8.9–10.3)
Chloride: 98 mmol/L (ref 98–111)
Creatinine, Ser: 1.64 mg/dL — ABNORMAL HIGH (ref 0.61–1.24)
GFR, Estimated: 45 mL/min — ABNORMAL LOW (ref >60)
Glucose, Bld: 121 mg/dL — ABNORMAL HIGH (ref 70–99)
Potassium: 4.7 mmol/L (ref 3.5–5.1)
Sodium: 137 mmol/L (ref 135–145)
Total Bilirubin: 0.8 mg/dL (ref 0.3–1.2)
Total Protein: 7.4 g/dL (ref 6.5–8.1)

## 2020-01-13 LAB — CBC WITH DIFFERENTIAL/PLATELET
Abs Immature Granulocytes: 0.04 10*3/uL (ref 0.00–0.07)
Basophils Absolute: 0.1 10*3/uL (ref 0.0–0.1)
Basophils Relative: 1 %
Eosinophils Absolute: 0.2 10*3/uL (ref 0.0–0.5)
Eosinophils Relative: 3 %
HCT: 48.4 % (ref 39.0–52.0)
Hemoglobin: 16.2 g/dL (ref 13.0–17.0)
Immature Granulocytes: 1 %
Lymphocytes Relative: 25 %
Lymphs Abs: 1.5 10*3/uL (ref 0.7–4.0)
MCH: 30.7 pg (ref 26.0–34.0)
MCHC: 33.5 g/dL (ref 30.0–36.0)
MCV: 91.7 fL (ref 80.0–100.0)
Monocytes Absolute: 0.8 10*3/uL (ref 0.1–1.0)
Monocytes Relative: 13 %
Neutro Abs: 3.5 10*3/uL (ref 1.7–7.7)
Neutrophils Relative %: 57 %
Platelets: 237 10*3/uL (ref 150–400)
RBC: 5.28 MIL/uL (ref 4.22–5.81)
RDW: 12.7 % (ref 11.5–15.5)
WBC: 6 10*3/uL (ref 4.0–10.5)
nRBC: 0 % (ref 0.0–0.2)

## 2020-01-13 LAB — TSH: TSH: 0.324 u[IU]/mL — ABNORMAL LOW (ref 0.350–4.500)

## 2020-01-13 LAB — T4, FREE: Free T4: 1.16 ng/dL — ABNORMAL HIGH (ref 0.61–1.12)

## 2020-01-17 DIAGNOSIS — D3701 Neoplasm of uncertain behavior of lip: Secondary | ICD-10-CM | POA: Diagnosis not present

## 2020-01-17 DIAGNOSIS — Z85810 Personal history of malignant neoplasm of tongue: Secondary | ICD-10-CM | POA: Diagnosis not present

## 2020-01-17 DIAGNOSIS — B977 Papillomavirus as the cause of diseases classified elsewhere: Secondary | ICD-10-CM | POA: Diagnosis not present

## 2020-02-12 ENCOUNTER — Encounter: Payer: Self-pay | Admitting: Otolaryngology

## 2020-02-13 ENCOUNTER — Encounter: Payer: Self-pay | Admitting: Otolaryngology

## 2020-02-24 NOTE — Discharge Instructions (Signed)

## 2020-02-25 ENCOUNTER — Other Ambulatory Visit: Payer: Self-pay

## 2020-02-25 ENCOUNTER — Other Ambulatory Visit
Admission: RE | Admit: 2020-02-25 | Discharge: 2020-02-25 | Disposition: A | Discharge status: Home / Self Care | Payer: Medicare HMO | Source: Ambulatory Visit | Attending: Otolaryngology | Admitting: Otolaryngology

## 2020-02-25 DIAGNOSIS — Z20822 Contact with and (suspected) exposure to covid-19: Secondary | ICD-10-CM | POA: Insufficient documentation

## 2020-02-25 DIAGNOSIS — Z85810 Personal history of malignant neoplasm of tongue: Secondary | ICD-10-CM | POA: Diagnosis not present

## 2020-02-25 DIAGNOSIS — B977 Papillomavirus as the cause of diseases classified elsewhere: Secondary | ICD-10-CM | POA: Diagnosis not present

## 2020-02-25 DIAGNOSIS — Z01812 Encounter for preprocedural laboratory examination: Secondary | ICD-10-CM | POA: Diagnosis not present

## 2020-02-26 LAB — SARS CORONAVIRUS 2 (TAT 6-24 HRS): SARS Coronavirus 2: NEGATIVE

## 2020-02-27 ENCOUNTER — Ambulatory Visit: Payer: Medicare HMO | Admitting: Anesthesiology

## 2020-02-27 ENCOUNTER — Other Ambulatory Visit: Payer: Self-pay

## 2020-02-27 ENCOUNTER — Encounter: Payer: Self-pay | Admitting: Otolaryngology

## 2020-02-27 ENCOUNTER — Encounter
Admission: RE | Disposition: A | Discharge status: Home / Self Care | Payer: Self-pay | Source: Home / Self Care | Attending: Otolaryngology

## 2020-02-27 ENCOUNTER — Ambulatory Visit
Admission: RE | Admit: 2020-02-27 | Discharge: 2020-02-27 | Disposition: A | Discharge status: Home / Self Care | Payer: Medicare HMO | Attending: Otolaryngology | Admitting: Otolaryngology

## 2020-02-27 DIAGNOSIS — D101 Benign neoplasm of tongue: Secondary | ICD-10-CM | POA: Diagnosis not present

## 2020-02-27 DIAGNOSIS — D100 Benign neoplasm of lip: Secondary | ICD-10-CM | POA: Diagnosis not present

## 2020-02-27 DIAGNOSIS — K130 Diseases of lips: Secondary | ICD-10-CM | POA: Diagnosis present

## 2020-02-27 DIAGNOSIS — B977 Papillomavirus as the cause of diseases classified elsewhere: Secondary | ICD-10-CM | POA: Diagnosis not present

## 2020-02-27 DIAGNOSIS — Z85810 Personal history of malignant neoplasm of tongue: Secondary | ICD-10-CM | POA: Insufficient documentation

## 2020-02-27 DIAGNOSIS — K148 Other diseases of tongue: Secondary | ICD-10-CM | POA: Diagnosis not present

## 2020-02-27 DIAGNOSIS — K1379 Other lesions of oral mucosa: Secondary | ICD-10-CM | POA: Diagnosis not present

## 2020-02-27 HISTORY — PX: EXCISION OF TONGUE LESION: SHX6434

## 2020-02-27 SURGERY — EXCISION, LESION, TONGUE
Anesthesia: General | Site: Mouth | Laterality: Bilateral

## 2020-02-27 MED ORDER — GLYCOPYRROLATE 0.2 MG/ML IJ SOLN
INTRAMUSCULAR | Status: DC | PRN
  Administered 2020-02-27: .1 mg via INTRAVENOUS

## 2020-02-27 MED ORDER — ACETAMINOPHEN 325 MG PO TABS: 325.0000 mg | ORAL_TABLET | ORAL | Status: DC | PRN

## 2020-02-27 MED ORDER — DEXAMETHASONE SODIUM PHOSPHATE 4 MG/ML IJ SOLN
INTRAMUSCULAR | Status: DC | PRN
  Administered 2020-02-27: 8 mg via INTRAVENOUS

## 2020-02-27 MED ORDER — HYDROCODONE-ACETAMINOPHEN 5-325 MG PO TABS: 1.0000 | ORAL_TABLET | Freq: Four times a day (QID) | ORAL | 0 refills | Status: AC | PRN

## 2020-02-27 MED ORDER — LIDOCAINE-EPINEPHRINE 1 %-1:100000 IJ SOLN
INTRAMUSCULAR | Status: DC | PRN
  Administered 2020-02-27: 2.5 mL

## 2020-02-27 MED ORDER — MIDAZOLAM HCL 5 MG/5ML IJ SOLN
INTRAMUSCULAR | Status: DC | PRN
  Administered 2020-02-27: 2 mg via INTRAVENOUS

## 2020-02-27 MED ORDER — LIDOCAINE HCL (CARDIAC) PF 100 MG/5ML IV SOSY
PREFILLED_SYRINGE | INTRAVENOUS | Status: DC | PRN
  Administered 2020-02-27: 50 mg via INTRATRACHEAL

## 2020-02-27 MED ORDER — LACTATED RINGERS IV SOLN: INTRAVENOUS | Status: DC

## 2020-02-27 MED ORDER — EPHEDRINE SULFATE 50 MG/ML IJ SOLN
INTRAMUSCULAR | Status: DC | PRN
  Administered 2020-02-27: 5 mg via INTRAVENOUS

## 2020-02-27 MED ORDER — FENTANYL CITRATE (PF) 100 MCG/2ML IJ SOLN
INTRAMUSCULAR | Status: DC | PRN
  Administered 2020-02-27: 25 ug via INTRAVENOUS

## 2020-02-27 MED ORDER — FENTANYL CITRATE (PF) 100 MCG/2ML IJ SOLN: 25.0000 ug | INTRAMUSCULAR | Status: DC | PRN

## 2020-02-27 MED ORDER — OXYCODONE HCL 5 MG PO TABS: 5.0000 mg | ORAL_TABLET | Freq: Once | ORAL | Status: DC | PRN

## 2020-02-27 MED ORDER — OXYCODONE HCL 5 MG/5ML PO SOLN: 5.0000 mg | Freq: Once | ORAL | Status: DC | PRN

## 2020-02-27 MED ORDER — ONDANSETRON HCL 4 MG/2ML IJ SOLN
INTRAMUSCULAR | Status: DC | PRN
  Administered 2020-02-27: 4 mg via INTRAVENOUS

## 2020-02-27 MED ORDER — ACETAMINOPHEN 160 MG/5ML PO SOLN: 325.0000 mg | ORAL | Status: DC | PRN

## 2020-02-27 MED ORDER — ONDANSETRON HCL 4 MG/2ML IJ SOLN: 4.0000 mg | Freq: Once | INTRAMUSCULAR | Status: DC | PRN

## 2020-02-27 MED ORDER — PROPOFOL 10 MG/ML IV BOLUS
INTRAVENOUS | Status: DC | PRN
  Administered 2020-02-27: 100 mg via INTRAVENOUS

## 2020-02-27 SURGICAL SUPPLY — 25 items
BLADE BOVIE TIP EXT 4 (BLADE) ×2 IMPLANT
BLADE SURG 15 STRL LF DISP TIS (BLADE) ×1 IMPLANT
BLADE SURG 15 STRL SS (BLADE) ×2
COVER MAYO STAND STRL (DRAPES) ×2 IMPLANT
COVER TABLE BACK 60X90 (DRAPES) ×2 IMPLANT
CUP MEDICINE 2OZ PLAST GRAD ST (MISCELLANEOUS) ×2 IMPLANT
ELECT CAUTERY NEEDLE 2.0 MIC (NEEDLE) ×2 IMPLANT
ELECT REM PT RETURN 9FT ADLT (ELECTROSURGICAL) ×2
ELECTRODE REM PT RTRN 9FT ADLT (ELECTROSURGICAL) ×1 IMPLANT
GLOVE PI ULTRA LF STRL 7.5 (GLOVE) ×1 IMPLANT
GLOVE PI ULTRA NON LATEX 7.5 (GLOVE) ×1
KIT TURNOVER KIT A (KITS) ×2 IMPLANT
NEEDLE HYPO 25GX1X1/2 BEV (NEEDLE) ×2 IMPLANT
NS IRRIG 500ML POUR BTL (IV SOLUTION) ×2 IMPLANT
PENCIL SMOKE EVACUATOR (MISCELLANEOUS) ×2 IMPLANT
SLEEVE SUCTION 125 (MISCELLANEOUS) ×2 IMPLANT
SPONGE XRAY 4X4 16PLY STRL (MISCELLANEOUS) ×2 IMPLANT
STRAP BODY AND KNEE 60X3 (MISCELLANEOUS) ×2 IMPLANT
SUT CHROMIC 4 0 RB 1X27 (SUTURE) ×2 IMPLANT
SUT VIC AB 4-0 RB1 27 (SUTURE) ×2
SUT VIC AB 4-0 RB1 27X BRD (SUTURE) ×1 IMPLANT
SYR 3ML LL SCALE MARK (SYRINGE) ×2 IMPLANT
TOWEL OR 17X26 4PK STRL BLUE (TOWEL DISPOSABLE) ×2 IMPLANT
TUBING CONN 6MMX3.1M (TUBING) ×1
TUBING SUCTION CONN 0.25 STRL (TUBING) ×1 IMPLANT

## 2020-02-27 NOTE — Op Note (Signed)
02/27/2020  9:40 AM    [name], Troy Hutchinson  [id number]   Pre-Op Dx: Mucosal lesions involving the right lower lip, right lateral tongue, and left ventral anterior tongue  Post-op Dx: Same  Proc: Excision mucosal lesion of the right lower lip, excision mucosal lesion of the right lateral tongue, excision lesion of the left anterior ventral tongue, primary closure of all lesions  Surg:  Elon Alas Balen Woolum  Anes:  LMA  EBL: 15 mL  Comp: None  Findings: Papillomatous growths of all of these. Each had a sessile base. Each was removed with an elliptical incision to remove the entire base  Procedure: Patient was given general anesthesia by laryngeal mask anesthesia. Once the patient was fully asleep a mouthgag was placed to open up the jaw to be able to visualize the mouth. The right lower lip lesion was on the mucosa inside the lower lip near the oral commissure. Approximately 1 mL of lidocaine with epi 1: 100,000 was used for infiltration in this area. The same solution was then used for infiltration along the right lateral tongue where there was a papillary lesion and then the left anterior ventral tongue. A total of about 3 mL was used.  The right lip lesion was excised first using a 15 blade to incise an ellipse around the base of the papilloma. Full-thickness mucosa was removed. Electrocautery was used for controlling some slight bleeding in the base of the excision. The wound was then closed using 4-0 chromic's to advance mucosa on both sides to close it primarily. Several interrupted sutures were placed with a buried knot.  The right lateral tongue lesion was then excised in a similar fashion sharply cutting an ellipse around the lesion and then removing the entire lesion. Electrocautery was used at the base to control bleeding. 4-0 chromics were then used with buried knot to close the wound. The left anterior ventral mucosal lesion was then incised around it and removed completely.  Electrocautery was used at the base and then it was closed with 4-0 chromic sutures with buried knots.  There is no significant bleeding. The patient tolerated the procedure well. He was awakened taken to recovery room in satisfactory condition. There were no operative complications  Dispo:   To PACU to be discharged home  Plan: To follow-up in the office in a week to go over the path report. I given him 15 Norco tabs to use for severe pain and then he can use some Tylenol or ibuprofen. He will drink liquids and use soft foods for the time being and then can advance his diet as tolerated. He will call if he has any problems  Huey Romans  02/27/2020 9:40 AM

## 2020-02-27 NOTE — Anesthesia Preprocedure Evaluation (Signed)
Anesthesia Evaluation  Patient identified by MRN, date of birth, ID band Patient awake    Reviewed: Allergy & Precautions, H&P , NPO status , Patient's Chart, lab work & pertinent test results  Airway Mallampati: II  TM Distance: >3 FB Neck ROM: full    Dental  (+) Edentulous Upper, Missing   Pulmonary    Pulmonary exam normal breath sounds clear to auscultation       Cardiovascular hypertension, Normal cardiovascular exam Rhythm:regular Rate:Normal     Neuro/Psych    GI/Hepatic GERD  ,  Endo/Other  Hypothyroidism   Renal/GU Renal disease     Musculoskeletal   Abdominal   Peds  Hematology   Anesthesia Other Findings   Reproductive/Obstetrics                             Anesthesia Physical Anesthesia Plan  ASA: III  Anesthesia Plan: General LMA   Post-op Pain Management:    Induction:   PONV Risk Score and Plan: 2 and Treatment may vary due to age or medical condition, Ondansetron and Dexamethasone  Airway Management Planned:   Additional Equipment:   Intra-op Plan:   Post-operative Plan:   Informed Consent: I have reviewed the patients History and Physical, chart, labs and discussed the procedure including the risks, benefits and alternatives for the proposed anesthesia with the patient or authorized representative who has indicated his/her understanding and acceptance.     Dental Advisory Given  Plan Discussed with: CRNA  Anesthesia Plan Comments:         Anesthesia Quick Evaluation

## 2020-02-27 NOTE — Anesthesia Procedure Notes (Signed)
Procedure Name: LMA Insertion Date/Time: 02/27/2020 9:07 AM Performed by: Mayme Genta, CRNA Pre-anesthesia Checklist: Patient identified, Emergency Drugs available, Suction available, Timeout performed and Patient being monitored Patient Re-evaluated:Patient Re-evaluated prior to induction Oxygen Delivery Method: Circle system utilized Preoxygenation: Pre-oxygenation with 100% oxygen Induction Type: IV induction LMA: LMA inserted LMA Size: 3.0 Number of attempts: 2 Placement Confirmation: positive ETCO2 and breath sounds checked- equal and bilateral Tube secured with: Tape Comments: Unable to get a seal with LMA #4. #3 LMA inserted without difficulty. Good Vt noted.

## 2020-02-27 NOTE — Transfer of Care (Signed)
Immediate Anesthesia Transfer of Care Note  Patient: Troy [name]  Procedure(s) Performed: EXCISION OF TONGUE AND MOUTH LESION (Bilateral Mouth)  Patient Location: PACU  Anesthesia Type: General LMA  Level of Consciousness: awake, alert  and patient cooperative  Airway and Oxygen Therapy: Patient Spontanous Breathing and Patient connected to supplemental oxygen  Post-op Assessment: Post-op Vital signs reviewed, Patient's Cardiovascular Status Stable, Respiratory Function Stable, Patent Airway and No signs of Nausea or vomiting  Post-op Vital Signs: Reviewed and stable  Complications: No complications documented.

## 2020-02-27 NOTE — H&P (Signed)
H&P has been reviewed and patient reevaluated, no changes necessary. To be downloaded later.  

## 2020-02-27 NOTE — Anesthesia Postprocedure Evaluation (Signed)
Anesthesia Post Note  Patient: Troy [name]  Procedure(s) Performed: EXCISION OF TONGUE AND MOUTH LESION (Bilateral Mouth)     Patient location during evaluation: PACU Anesthesia Type: General Level of consciousness: awake and alert and oriented Pain management: satisfactory to patient Vital Signs Assessment: post-procedure vital signs reviewed and stable Respiratory status: spontaneous breathing, nonlabored ventilation and respiratory function stable Cardiovascular status: blood pressure returned to baseline and stable Postop Assessment: Adequate PO intake and No signs of nausea or vomiting Anesthetic complications: no   No complications documented.  Raliegh Ip

## 2020-02-28 ENCOUNTER — Encounter: Payer: Self-pay | Admitting: Otolaryngology

## 2020-02-28 LAB — SURGICAL PATHOLOGY

## 2020-06-24 ENCOUNTER — Ambulatory Visit: Payer: Medicare HMO | Admitting: Family Medicine

## 2020-06-30 IMAGING — CT CT CHEST W/ CM
1 series · 15 of 34 positions shown, 19 images · IV contrast (omnipaque)
Comparison: PET-CT 04/12/2017

CLINICAL DATA: Patient with history of squamous cell carcinoma of
the base of the tongue. Patient status post chemotherapy and
radiation.

EXAM:
CT CHEST WITH CONTRAST
TECHNIQUE: Multidetector CT imaging of the chest was performed during
intravenous contrast administration.
CONTRAST:  75mL OMNIPAQUE IOHEXOL 300 MG/ML  SOLN

[Series 3: axial st · axial · 0.68mm/px · z∈[-600,-324]mm · 15 of 164 slices shown, 19 images]
[im 13/164  mediastinal]
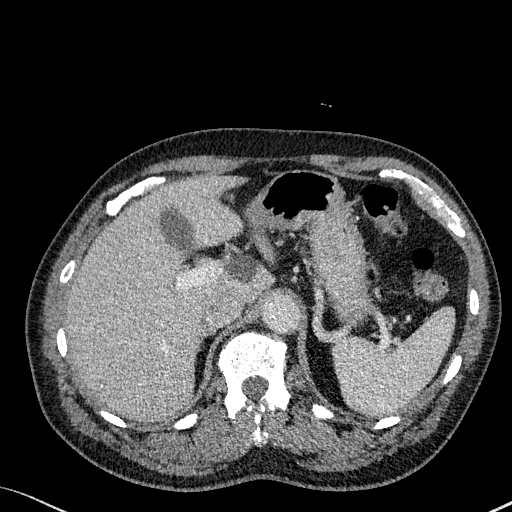
[im 13/164  lung]
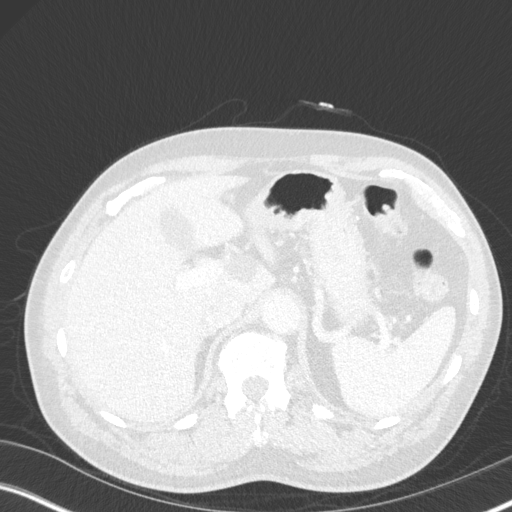
[im 25/164  lung]
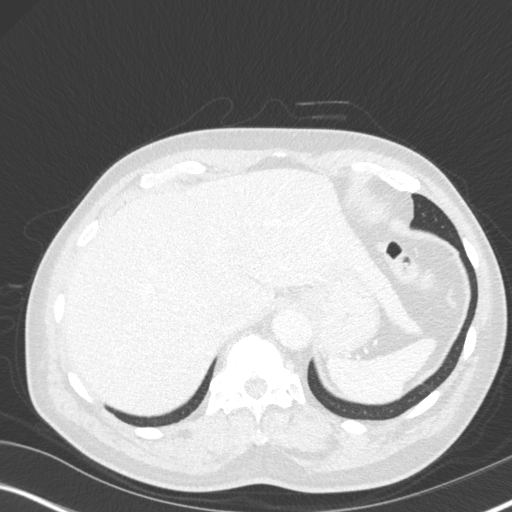
[im 33/164  lung]
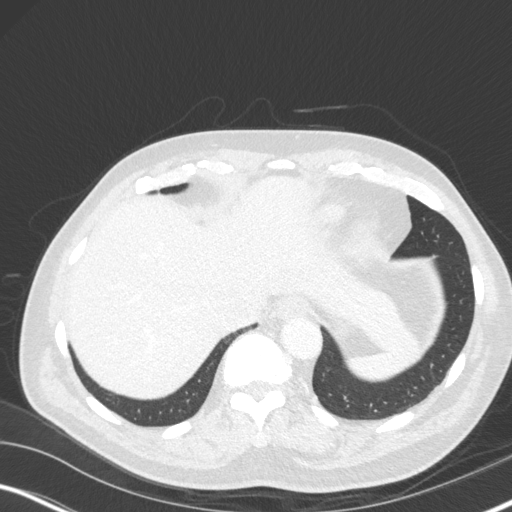
[im 43/164  lung]
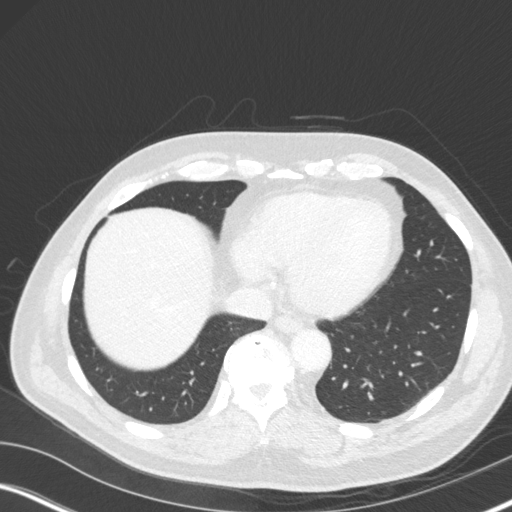
[im 55/164  mediastinal]
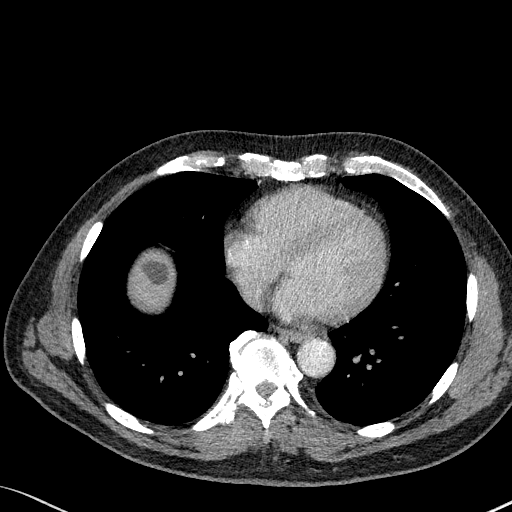
[im 55/164  lung]
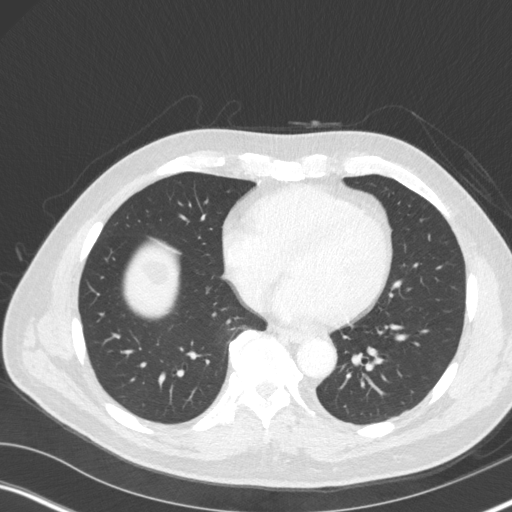
[im 66/164  lung]
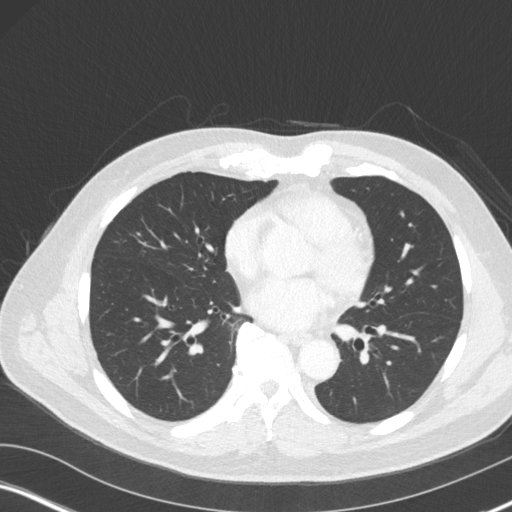
[im 73/164  lung]
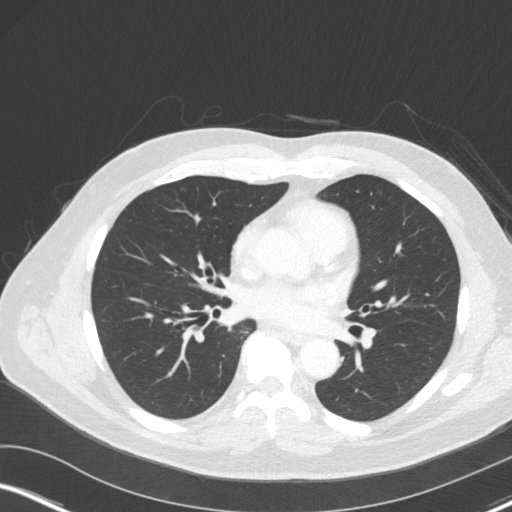
[im 85/164  lung]
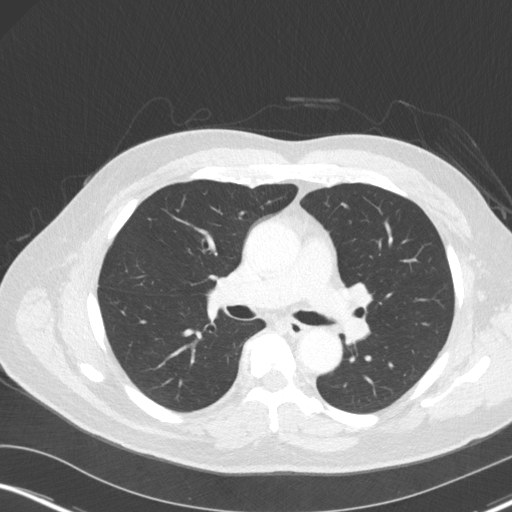
[im 91/164  mediastinal]
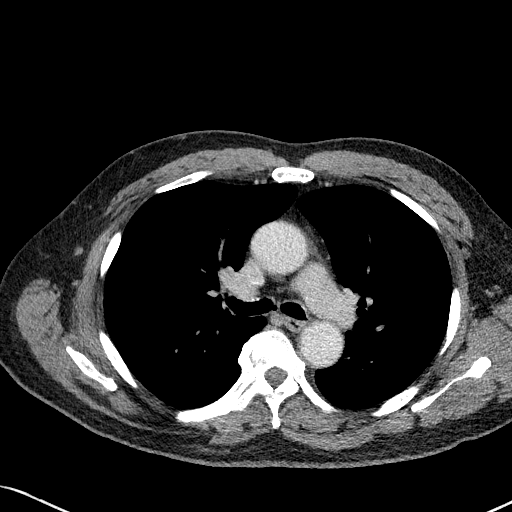
[im 91/164  lung]
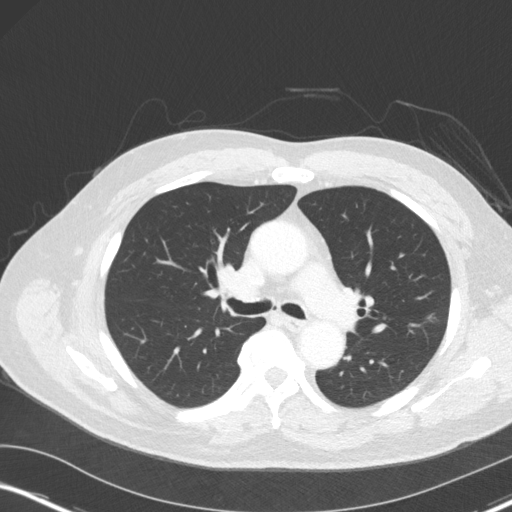
[im 98/164  lung]
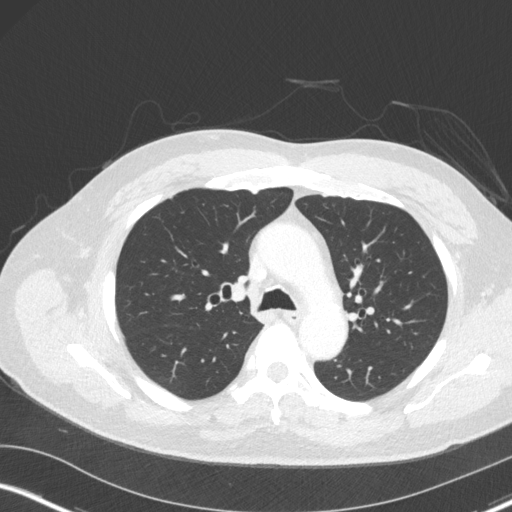
[im 109/164  lung]
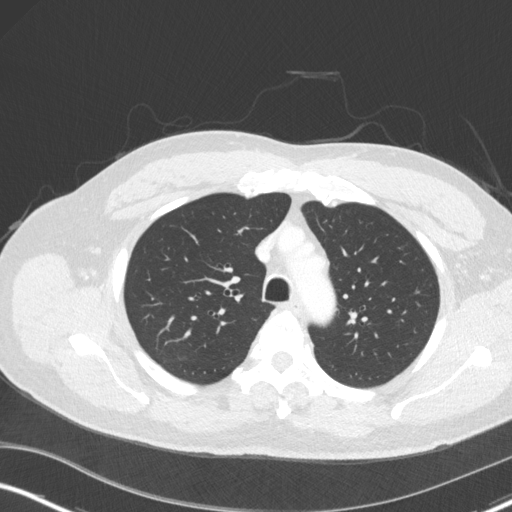
[im 121/164  lung]
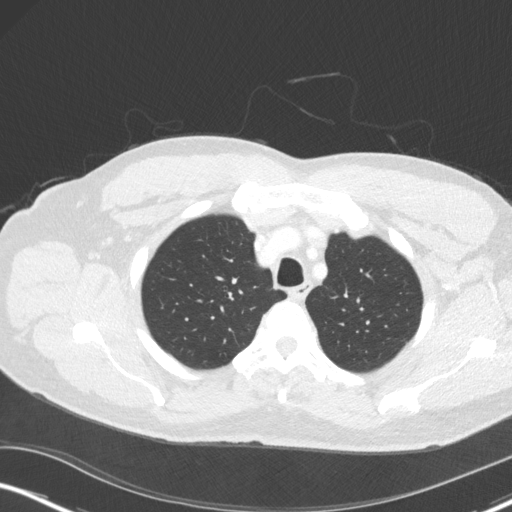
[im 131/164  mediastinal]
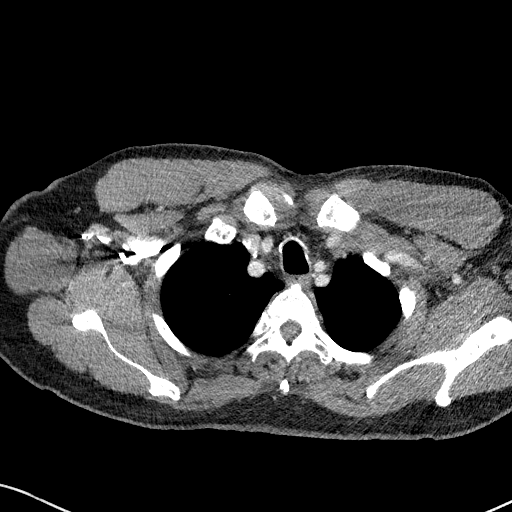
[im 131/164  lung]
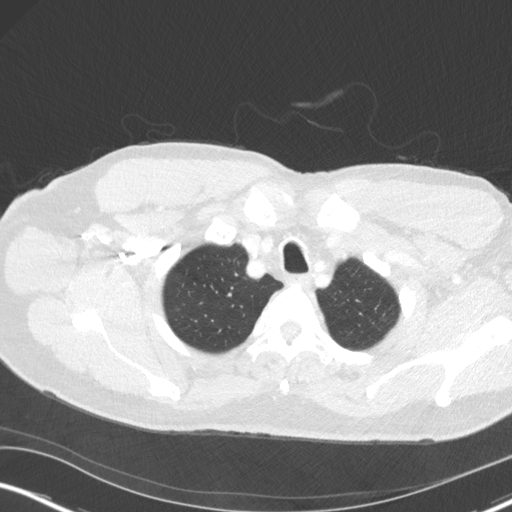
[im 139/164  lung]
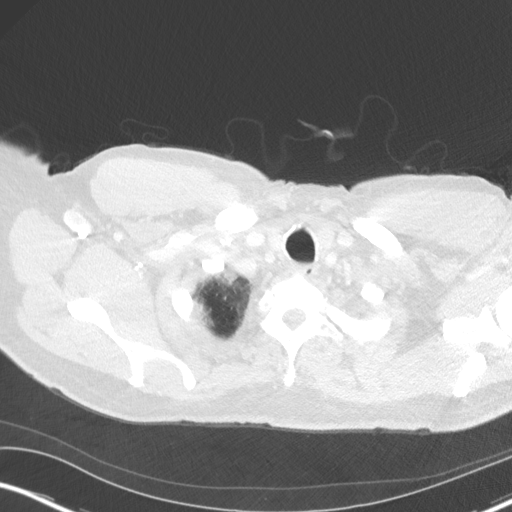
[im 151/164  lung]
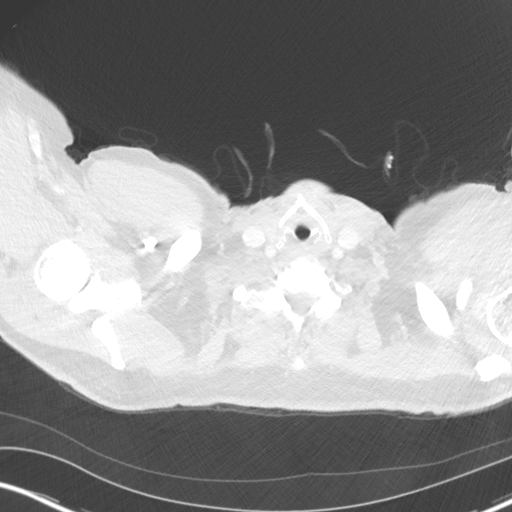

[15 of 34 positions shown; findings below may reference images not displayed]

FINDINGS: Cardiovascular: Normal heart size. Trace fluid superior pericardial
recess. Thoracic aortic vascular calcifications.

Mediastinum/Nodes: Normal appearance of the esophagus. No enlarged
axillary, mediastinal or hilar lymphadenopathy.

Lungs/Pleura: Central airways are patent. Dependent atelectasis.
Stable 2 mm right upper lobe nodule (image 72; series 4). No pleural
effusion or pneumothorax.

Upper Abdomen: 1.8 cm cyst within the hepatic dome. 1.8 cm cyst
within the caudate lobe. Gallbladder is unremarkable. No
intrahepatic or extrahepatic biliary ductal dilatation.

Musculoskeletal: Thoracic spine degenerative changes. No aggressive
or acute appearing osseous lesions.
IMPRESSION: No evidence for metastatic disease within the chest.

2 mm right upper lobe nodule.  Recommend attention on follow-up.

Aortic Atherosclerosis ([id number]).

## 2020-07-06 ENCOUNTER — Other Ambulatory Visit: Payer: Self-pay

## 2020-07-06 ENCOUNTER — Encounter: Payer: Self-pay | Admitting: Family Medicine

## 2020-07-06 ENCOUNTER — Ambulatory Visit (INDEPENDENT_AMBULATORY_CARE_PROVIDER_SITE_OTHER): Payer: Medicare HMO | Admitting: Family Medicine

## 2020-07-06 VITALS — BP 138/80 | HR 64 | Ht 67.0 in | Wt 168.0 lb

## 2020-07-06 DIAGNOSIS — N5201 Erectile dysfunction due to arterial insufficiency: Secondary | ICD-10-CM | POA: Diagnosis not present

## 2020-07-06 DIAGNOSIS — K219 Gastro-esophageal reflux disease without esophagitis: Secondary | ICD-10-CM

## 2020-07-06 DIAGNOSIS — E890 Postprocedural hypothyroidism: Secondary | ICD-10-CM | POA: Diagnosis not present

## 2020-07-06 DIAGNOSIS — E7801 Familial hypercholesterolemia: Secondary | ICD-10-CM

## 2020-07-06 DIAGNOSIS — I10 Essential (primary) hypertension: Secondary | ICD-10-CM | POA: Diagnosis not present

## 2020-07-06 MED ORDER — AMLODIPINE BESYLATE 5 MG PO TABS: 5.0000 mg | ORAL_TABLET | Freq: Every day | ORAL | 5 refills | Status: DC

## 2020-07-06 MED ORDER — ESOMEPRAZOLE MAGNESIUM 40 MG PO CPDR: 40.0000 mg | DELAYED_RELEASE_CAPSULE | Freq: Every day | ORAL | 5 refills | Status: DC

## 2020-07-06 MED ORDER — LEVOTHYROXINE SODIUM 125 MCG PO TABS: 125.0000 ug | ORAL_TABLET | Freq: Every day | ORAL | 5 refills | Status: DC

## 2020-07-06 MED ORDER — SILDENAFIL CITRATE 100 MG PO TABS: 50.0000 mg | ORAL_TABLET | Freq: Every day | ORAL | 11 refills | Status: DC | PRN

## 2020-07-06 NOTE — Progress Notes (Signed)
Date:  07/06/2020   Name:  Troy [name]   DOB:  Jun 11, 1950   MRN:  [id number]   Chief Complaint: Hypertension, Gastroesophageal Reflux, Hypothyroidism, and Erectile Dysfunction  Hypertension This is a chronic problem. The current episode started more than 1 year ago. The problem has been gradually improving since onset. The problem is controlled. Pertinent negatives include no anxiety, blurred vision, chest pain, headaches, malaise/fatigue, neck pain, orthopnea, palpitations, peripheral edema, PND, shortness of breath or sweats. There are no associated agents to hypertension. Risk factors for coronary artery disease include dyslipidemia. Past treatments include calcium channel blockers. The current treatment provides moderate improvement. There are no compliance problems.  There is no history of angina, kidney disease, CAD/MI, CVA, heart failure, left ventricular hypertrophy, PVD or retinopathy. Identifiable causes of hypertension include a thyroid problem. There is no history of chronic renal disease, a hypertension causing med or renovascular disease.  Gastroesophageal Reflux He reports no abdominal pain, no belching, no chest pain, no coughing, no dysphagia, no heartburn, no hoarse voice, no nausea, no sore throat or no wheezing. This is a chronic problem. The current episode started more than 1 year ago. The problem has been gradually improving. The symptoms are aggravated by certain foods. Pertinent negatives include no anemia, fatigue, melena, muscle weakness, orthopnea or weight loss. He has tried a PPI for the symptoms. The treatment provided moderate relief.  Erectile Dysfunction This is a chronic problem. The problem has been gradually improving since onset. The nature of his difficulty is achieving erection and maintaining erection. He reports no anxiety. Irritative symptoms do not include frequency or urgency. Pertinent negatives include no chills, dysuria or hematuria. Past treatments  include sildenafil. The treatment provided moderate relief.  Thyroid Problem Patient reports no anxiety, constipation, diarrhea, fatigue, hoarse voice, palpitations or weight loss. There is no history of heart failure.    Lab Results  Component Value Date   CREATININE 1.64 (H) 01/13/2020   BUN 19 01/13/2020   NA 137 01/13/2020   K 4.7 01/13/2020   CL 98 01/13/2020   CO2 31 01/13/2020   Lab Results  Component Value Date   CHOL 220 (H) 03/31/2017   HDL 58 03/31/2017   LDLCALC 144 (H) 03/31/2017   TRIG 90 03/31/2017   CHOLHDL 3.8 03/31/2017   Lab Results  Component Value Date   TSH 0.324 (L) 01/13/2020   No results found for: HGBA1C Lab Results  Component Value Date   WBC 6.0 01/13/2020   HGB 16.2 01/13/2020   HCT 48.4 01/13/2020   MCV 91.7 01/13/2020   PLT 237 01/13/2020   Lab Results  Component Value Date   ALT 18 01/13/2020   AST 19 01/13/2020   ALKPHOS 44 01/13/2020   BILITOT 0.8 01/13/2020     Review of Systems  Constitutional: Negative for chills, fatigue, fever, malaise/fatigue and weight loss.  HENT: Negative for drooling, ear discharge, ear pain, hoarse voice and sore throat.   Eyes: Negative for blurred vision.  Respiratory: Negative for cough, shortness of breath and wheezing.   Cardiovascular: Negative for chest pain, palpitations, orthopnea, leg swelling and PND.  Gastrointestinal: Negative for abdominal pain, blood in stool, constipation, diarrhea, dysphagia, heartburn, melena and nausea.  Endocrine: Negative for polydipsia.  Genitourinary: Negative for dysuria, frequency, hematuria and urgency.  Musculoskeletal: Negative for back pain, myalgias, muscle weakness and neck pain.  Skin: Negative for rash.  Allergic/Immunologic: Negative for environmental allergies.  Neurological: Negative for dizziness and headaches.  Hematological:  Does not bruise/bleed easily.  Psychiatric/Behavioral: Negative for suicidal ideas. The patient is not nervous/anxious.      Patient Active Problem List   Diagnosis Date Noted  . Hypokalemia 04/09/2019  . Hypocalcemia 01/03/2019  . Abnormal PET scan of head 10/26/2018  . Low TSH level 09/16/2018  . Renal insufficiency 07/09/2018  . Atherosclerosis of aorta ([place]) 06/27/2018  . Encounter for antineoplastic immunotherapy 05/18/2018  . Erythrocytosis 04/27/2018  . 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 Goals of care, counseling/discussion 04/07/2017  . Stage 3 chronic kidney disease ([place]) 04/03/2017  . Hypothyroid 03/31/2017  . ED (erectile dysfunction) 03/31/2017  . Gastroesophageal reflux disease 09/23/2016  . Essential hypertension 09/23/2016  . Squamous cell carcinoma of base of tongue ([facility]) 08/04/2016    Allergies  Allergen Reactions  . Ace Inhibitors Cough    Past Surgical History:  Procedure Laterality Date  . EXCISION OF TONGUE LESION Bilateral 02/27/2020   Procedure: EXCISION OF TONGUE AND MOUTH LESION;  Surgeon: Margaretha Sheffield, MD;  Location: [facility];  Service: ENT;  Laterality: Bilateral;  . tumor removed     throat    Social History   Tobacco Use  . Smoking status: Never Smoker  . Smokeless tobacco: Never Used  Vaping Use  . Vaping Use: Never used  Substance Use Topics  . Alcohol use: Yes    Alcohol/week: 2.0 standard drinks    Types: 2 Shots of liquor per week  . Drug use: No     Medication list has been reviewed and updated.  Current Meds  Medication Sig  . acetaminophen (TYLENOL) 650 MG CR tablet Take 1,300 mg by mouth every 8 (eight) hours.  Marland Kitchen amLODipine (NORVASC) 5 MG tablet Take 1 tablet (5 mg total) by mouth daily.  Marland Kitchen aspirin EC 81 MG tablet Take 81 mg by mouth daily.  Marland Kitchen esomeprazole (NEXIUM) 40 MG capsule Take 1 capsule (40 mg total) by mouth daily.  Marland Kitchen levothyroxine (SYNTHROID) 125 MCG tablet Take 1 tablet (125 mcg total) by mouth daily.  . sildenafil (VIAGRA) 100 MG tablet Take 0.5-1 tablets (50-100 mg total) by mouth daily as needed for  erectile dysfunction.    PHQ 2/9 Scores 07/06/2020 01/07/2020 12/23/2019 09/03/2019  PHQ - 2 Score 0 0 0 0  PHQ- 9 Score 0 0 - 0    GAD 7 : Generalized Anxiety Score 07/06/2020 01/07/2020 09/03/2019 07/04/2019  Nervous, Anxious, on Edge 0 0 0 0  Control/stop worrying 0 0 0 0  Worry too much - different things 0 0 0 0  Trouble relaxing 0 0 0 0  Restless 0 0 0 0  Easily annoyed or irritable 1 0 0 0  Afraid - awful might happen 0 0 0 0  Total GAD 7 Score 1 0 0 0  Anxiety Difficulty Not difficult at all - - -    BP Readings from Last 3 Encounters:  07/06/20 138/80  02/27/20 120/82  01/13/20 (!) 141/88    Physical Exam Vitals and nursing note reviewed.  HENT:     Head: Normocephalic.     Right Ear: Tympanic membrane and external ear normal.     Left Ear: Tympanic membrane and external ear normal.     Nose: Nose normal.  Eyes:     General: No scleral icterus.       Right eye: No discharge.        Left eye: No discharge.     Conjunctiva/sclera: Conjunctivae normal.  Pupils: Pupils are equal, round, and reactive to light.  Neck:     Thyroid: No thyromegaly.     Vascular: No JVD.     Trachea: No tracheal deviation.  Cardiovascular:     Rate and Rhythm: Normal rate and regular rhythm.     Heart sounds: Normal heart sounds, S1 normal and S2 normal. No murmur heard.  No systolic murmur is present.  No diastolic murmur is present. No friction rub. No gallop. No S3 or S4 sounds.   Pulmonary:     Effort: No respiratory distress.     Breath sounds: Normal breath sounds. No wheezing or rales.  Abdominal:     General: Bowel sounds are normal.     Palpations: Abdomen is soft. There is no mass.     Tenderness: There is no abdominal tenderness. There is no guarding or rebound.  Musculoskeletal:        General: No tenderness. Normal range of motion.     Cervical back: Normal range of motion and neck supple.  Lymphadenopathy:     Cervical: No cervical adenopathy.  Skin:     General: Skin is warm.     Findings: No rash.  Neurological:     Mental Status: He is alert and oriented to person, place, and time.     Cranial Nerves: No cranial nerve deficit.     Deep Tendon Reflexes: Reflexes are normal and symmetric.     Wt Readings from Last 3 Encounters:  07/06/20 168 lb (76.2 kg)  02/27/20 171 lb (77.6 kg)  01/13/20 171 lb 15.3 oz (78 kg)    BP 138/80   Pulse 64   Ht 5\' 7"  (1.702 m)   Wt 168 lb (76.2 kg)   BMI 26.31 kg/m   Assessment and Plan: 1. Essential hypertension Chronic.  Controlled.  Stable.  Current blood pressure is 138/80.  We will continue Norvasc 5 mg once a day.  And check renal function panel for electrolytes and GFR status. - amLODipine (NORVASC) 5 MG tablet; Take 1 tablet (5 mg total) by mouth daily.  Dispense: 30 tablet; Refill: 5 - Renal Function Panel  2. Gastroesophageal reflux disease, unspecified whether esophagitis present Chronic.  Controlled.  Stable.  Continue Nexium 40 mg once a day. - esomeprazole (NEXIUM) 40 MG capsule; Take 1 capsule (40 mg total) by mouth daily.  Dispense: 30 capsule; Refill: 5  3. Postoperative hypothyroidism Chronic.  Controlled.  Stable.  Patient is currently on levothyroxine 125 mcg and pending TSH will continue at current dosing. - levothyroxine (SYNTHROID) 125 MCG tablet; Take 1 tablet (125 mcg total) by mouth daily.  Dispense: 30 tablet; Refill: 5 - TSH  4. Erectile dysfunction due to arterial insufficiency Chronic.  Controlled.  Stable.  Erectile dysfunction is controlled with sildenafil 100 mg 1/2 to 1 tablet as needed. - sildenafil (VIAGRA) 100 MG tablet; Take 0.5-1 tablets (50-100 mg total) by mouth daily as needed for erectile dysfunction.  Dispense: 5 tablet; Refill: 11  5. Familial hypercholesterolemia Chronic.  Controlled.  Stable.  Review of previous labs lipid panel was noted to have elevated LDL.  Will check lipid panel for current status of lipids. - Lipid Panel With LDL/HDL  Ratio

## 2020-07-07 ENCOUNTER — Encounter: Payer: Self-pay | Admitting: Oncology

## 2020-07-07 ENCOUNTER — Other Ambulatory Visit: Payer: Self-pay

## 2020-07-07 DIAGNOSIS — E890 Postprocedural hypothyroidism: Secondary | ICD-10-CM

## 2020-07-07 LAB — RENAL FUNCTION PANEL
Albumin: 3.9 g/dL (ref 3.8–4.8)
BUN/Creatinine Ratio: 7 — ABNORMAL LOW (ref 10–24)
BUN: 11 mg/dL (ref 8–27)
CO2: 24 mmol/L (ref 20–29)
Calcium: 9.3 mg/dL (ref 8.6–10.2)
Chloride: 99 mmol/L (ref 96–106)
Creatinine, Ser: 1.63 mg/dL — ABNORMAL HIGH (ref 0.76–1.27)
Glucose: 100 mg/dL — ABNORMAL HIGH (ref 65–99)
Phosphorus: 3 mg/dL (ref 2.8–4.1)
Potassium: 3.7 mmol/L (ref 3.5–5.2)
Sodium: 141 mmol/L (ref 134–144)
eGFR: 45 mL/min/{1.73_m2} — ABNORMAL LOW (ref >59)

## 2020-07-07 LAB — LIPID PANEL WITH LDL/HDL RATIO
Cholesterol, Total: 189 mg/dL (ref 100–199)
HDL: 55 mg/dL (ref >39)
LDL Chol Calc (NIH): 115 mg/dL — ABNORMAL HIGH (ref 0–99)
LDL/HDL Ratio: 2.1 ratio (ref 0.0–3.6)
Triglycerides: 108 mg/dL (ref 0–149)
VLDL Cholesterol Cal: 19 mg/dL (ref 5–40)

## 2020-07-07 LAB — TSH: TSH: 0.174 u[IU]/mL — ABNORMAL LOW (ref 0.450–4.500)

## 2020-07-07 MED ORDER — LEVOTHYROXINE SODIUM 112 MCG PO TABS: 112.0000 ug | ORAL_TABLET | Freq: Every day | ORAL | 1 refills | Status: DC

## 2020-07-07 NOTE — Progress Notes (Signed)
Sent in levo 112

## 2020-07-28 ENCOUNTER — Other Ambulatory Visit: Payer: Self-pay

## 2020-07-28 ENCOUNTER — Inpatient Hospital Stay: Payer: Medicare HMO | Attending: Oncology

## 2020-07-28 ENCOUNTER — Inpatient Hospital Stay (HOSPITAL_BASED_OUTPATIENT_CLINIC_OR_DEPARTMENT_OTHER): Payer: Medicare HMO | Admitting: Oncology

## 2020-07-28 VITALS — BP 149/98 | HR 95 | Temp 96.6°F | Resp 16 | Wt 169.1 lb

## 2020-07-28 DIAGNOSIS — D751 Secondary polycythemia: Secondary | ICD-10-CM | POA: Insufficient documentation

## 2020-07-28 DIAGNOSIS — K219 Gastro-esophageal reflux disease without esophagitis: Secondary | ICD-10-CM | POA: Diagnosis not present

## 2020-07-28 DIAGNOSIS — Z7982 Long term (current) use of aspirin: Secondary | ICD-10-CM | POA: Insufficient documentation

## 2020-07-28 DIAGNOSIS — E039 Hypothyroidism, unspecified: Secondary | ICD-10-CM | POA: Insufficient documentation

## 2020-07-28 DIAGNOSIS — C01 Malignant neoplasm of base of tongue: Secondary | ICD-10-CM

## 2020-07-28 DIAGNOSIS — Z79899 Other long term (current) drug therapy: Secondary | ICD-10-CM | POA: Insufficient documentation

## 2020-07-28 DIAGNOSIS — N189 Chronic kidney disease, unspecified: Secondary | ICD-10-CM | POA: Insufficient documentation

## 2020-07-28 DIAGNOSIS — I129 Hypertensive chronic kidney disease with stage 1 through stage 4 chronic kidney disease, or unspecified chronic kidney disease: Secondary | ICD-10-CM | POA: Diagnosis not present

## 2020-07-28 DIAGNOSIS — Z79890 Hormone replacement therapy (postmenopausal): Secondary | ICD-10-CM | POA: Diagnosis not present

## 2020-07-28 DIAGNOSIS — E079 Disorder of thyroid, unspecified: Secondary | ICD-10-CM | POA: Diagnosis not present

## 2020-07-28 DIAGNOSIS — Z809 Family history of malignant neoplasm, unspecified: Secondary | ICD-10-CM | POA: Insufficient documentation

## 2020-07-28 DIAGNOSIS — E782 Mixed hyperlipidemia: Secondary | ICD-10-CM

## 2020-07-28 LAB — COMPREHENSIVE METABOLIC PANEL
ALT: 14 U/L (ref 0–44)
AST: 21 U/L (ref 15–41)
Albumin: 3.8 g/dL (ref 3.5–5.0)
Alkaline Phosphatase: 41 U/L (ref 38–126)
Anion gap: 6 (ref 5–15)
BUN: 10 mg/dL (ref 8–23)
CO2: 27 mmol/L (ref 22–32)
Calcium: 8.9 mg/dL (ref 8.9–10.3)
Chloride: 102 mmol/L (ref 98–111)
Creatinine, Ser: 1.62 mg/dL — ABNORMAL HIGH (ref 0.61–1.24)
GFR, Estimated: 46 mL/min — ABNORMAL LOW (ref >60)
Glucose, Bld: 132 mg/dL — ABNORMAL HIGH (ref 70–99)
Potassium: 3.5 mmol/L (ref 3.5–5.1)
Sodium: 135 mmol/L (ref 135–145)
Total Bilirubin: 0.6 mg/dL (ref 0.3–1.2)
Total Protein: 7 g/dL (ref 6.5–8.1)

## 2020-07-28 LAB — CBC WITH DIFFERENTIAL/PLATELET
Abs Immature Granulocytes: 0.02 10*3/uL (ref 0.00–0.07)
Basophils Absolute: 0.1 10*3/uL (ref 0.0–0.1)
Basophils Relative: 1 %
Eosinophils Absolute: 0.2 10*3/uL (ref 0.0–0.5)
Eosinophils Relative: 3 %
HCT: 47.3 % (ref 39.0–52.0)
Hemoglobin: 16.3 g/dL (ref 13.0–17.0)
Immature Granulocytes: 0 %
Lymphocytes Relative: 24 %
Lymphs Abs: 1.3 10*3/uL (ref 0.7–4.0)
MCH: 31.7 pg (ref 26.0–34.0)
MCHC: 34.5 g/dL (ref 30.0–36.0)
MCV: 91.8 fL (ref 80.0–100.0)
Monocytes Absolute: 0.5 10*3/uL (ref 0.1–1.0)
Monocytes Relative: 10 %
Neutro Abs: 3.4 10*3/uL (ref 1.7–7.7)
Neutrophils Relative %: 62 %
Platelets: 230 10*3/uL (ref 150–400)
RBC: 5.15 MIL/uL (ref 4.22–5.81)
RDW: 12.1 % (ref 11.5–15.5)
WBC: 5.5 10*3/uL (ref 4.0–10.5)
nRBC: 0 % (ref 0.0–0.2)

## 2020-07-28 LAB — TSH: TSH: 0.389 u[IU]/mL (ref 0.350–4.500)

## 2020-07-28 NOTE — Progress Notes (Signed)
[facility]  [address], [address] [place], [place] [address] Phone: [phone]  Fax: [phone]   Clinic Day:  07/28/2020  Referring physician: Juline Patch, MD  Chief Complaint: Troy [name] is a 70 y.o. 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   HPI: The patient was last seen in the medical oncology clinic on 01/13/2020.   The patient had excision of right lip and right and left lateral tongue lesions by Dr. Kathyrn Sheriff on 02/27/20.  Biopsy results were negative for malignancy.   In the interim, he has been doing well.  He denies any new concerns.  He is wanting to go fishing later this afternoon.  Past Medical History:  Diagnosis Date   Cancer ([place])    GERD (gastroesophageal reflux disease)    Hypertension    Thyroid disease     Past Surgical History:  Procedure Laterality Date   EXCISION OF TONGUE LESION Bilateral 02/27/2020   Procedure: EXCISION OF TONGUE AND MOUTH LESION;  Surgeon: Margaretha Sheffield, MD;  Location: [facility];  Service: ENT;  Laterality: Bilateral;   tumor removed     throat    Family History  Problem Relation Age of Onset   Cancer Mother    Cancer Sister    Cancer Maternal Aunt    Cancer Paternal Uncle     Social History:  reports that he has never smoked. He has never used smokeless tobacco. He reports current alcohol use of about 2.0 standard drinks of alcohol per week. He reports that he does not use drugs.  He lives in [place]. He has been helping his sister with yard work. He is currently staying with his sister The patient is alone today.  Allergies:  Allergies  Allergen Reactions   Ace Inhibitors Cough    Current Medications: Current Outpatient Medications  Medication Sig Dispense Refill   acetaminophen (TYLENOL) 650 MG CR tablet Take 1,300 mg by mouth every 8 (eight) hours.     amLODipine (NORVASC) 5 MG tablet Take 1 tablet (5 mg total)  by mouth daily. 30 tablet 5   aspirin EC 81 MG tablet Take 81 mg by mouth daily.     esomeprazole (NEXIUM) 40 MG capsule Take 1 capsule (40 mg total) by mouth daily. 30 capsule 5   levothyroxine (SYNTHROID) 112 MCG tablet Take 1 tablet (112 mcg total) by mouth daily. 30 tablet 1   sildenafil (VIAGRA) 100 MG tablet Take 0.5-1 tablets (50-100 mg total) by mouth daily as needed for erectile dysfunction. 5 tablet 11   No current facility-administered medications for this visit.    Review of Systems  Constitutional: Negative.  Negative for chills, fever, malaise/fatigue and weight loss.  HENT:  Negative for congestion, ear pain and tinnitus.   Eyes: Negative.  Negative for blurred vision and double vision.  Respiratory: Negative.  Negative for cough, sputum production and shortness of breath.   Cardiovascular: Negative.  Negative for chest pain, palpitations and leg swelling.  Gastrointestinal: Negative.  Negative for abdominal pain, constipation, diarrhea, nausea and vomiting.  Genitourinary:  Negative for dysuria, frequency and urgency.  Musculoskeletal:  Negative for back pain and falls.  Skin: Negative.  Negative for rash.  Neurological: Negative.  Negative for weakness and headaches.  Endo/Heme/Allergies: Negative.  Does not bruise/bleed easily.  Psychiatric/Behavioral: Negative.  Negative for depression. The patient is not nervous/anxious and does not have insomnia.    Performance  status (ECOG):  0  Vitals  There were no vitals taken for this visit.  Physical Exam Constitutional:      Appearance: He is well-developed.  HENT:     Head: Normocephalic and atraumatic.  Eyes:     Pupils: Pupils are equal, round, and reactive to light.  Cardiovascular:     Rate and Rhythm: Normal rate and regular rhythm.     Heart sounds: No murmur heard. Pulmonary:     Effort: Pulmonary effort is normal.     Breath sounds: Normal breath sounds. No wheezing.  Abdominal:     General: Bowel sounds  are normal. There is no distension.     Palpations: Abdomen is soft. There is no mass.     Tenderness: There is no abdominal tenderness.  Musculoskeletal:        General: Normal range of motion.     Cervical back: Normal range of motion.  Skin:    General: Skin is warm and dry.  Neurological:     Mental Status: He is alert and oriented to person, place, and time.  Psychiatric:        Behavior: Behavior normal.   01/13/2020     No visits with results within 3 Day(s) from this visit.  Latest known visit with results is:  Office Visit on 07/06/2020  Component Date Value Ref Range Status   TSH 07/06/2020 0.174 (A) 0.450 - 4.500 uIU/mL Final   Glucose 07/06/2020 100 (A) 65 - 99 mg/dL Final   BUN 07/06/2020 11  8 - 27 mg/dL Final   Creatinine, Ser 07/06/2020 1.63 (A) 0.76 - 1.27 mg/dL Final   eGFR 07/06/2020 45 (A) >59 mL/min/1.73 Final   BUN/Creatinine Ratio 07/06/2020 7 (A) 10 - 24 Final   Sodium 07/06/2020 141  134 - 144 mmol/L Final   Potassium 07/06/2020 3.7  3.5 - 5.2 mmol/L Final   Chloride 07/06/2020 99  96 - 106 mmol/L Final   CO2 07/06/2020 24  20 - 29 mmol/L Final   Calcium 07/06/2020 9.3  8.6 - 10.2 mg/dL Final   Phosphorus 07/06/2020 3.0  2.8 - 4.1 mg/dL Final   Albumin 07/06/2020 3.9  3.8 - 4.8 g/dL Final   Cholesterol, Total 07/06/2020 189  100 - 199 mg/dL Final   Triglycerides 07/06/2020 108  0 - 149 mg/dL Final   HDL 07/06/2020 55  >39 mg/dL Final   VLDL Cholesterol Cal 07/06/2020 19  5 - 40 mg/dL Final   LDL Chol Calc (NIH) 07/06/2020 115 (A) 0 - 99 mg/dL Final   LDL/HDL Ratio 07/06/2020 2.1  0.0 - 3.6 ratio Final   Comment:                                     LDL/HDL Ratio                                             Men  Women                               1/2 Avg.Risk  1.0    1.5  Avg.Risk  3.6    3.2                                2X Avg.Risk  6.2    5.0                                3X Avg.Risk  8.0    6.1      Assessment:  Troy [name] is a 70 y.o. male with recurrent base of tongue carcinoma.  Clinical stage wasT3 N3 M0, p16+ in 11/2014.  He received concurrent cisplatin and radiation from 03/2015 - 05/21/2015.     PET scan on 09/02/2015 was equivocal.  PET scan on 12/07/2015 revealed abnormal FDG uptake (reactive from prior radiation therapy).     PET scan on 07/05/2016 revealed an enlarging level 2 lymph node (increasing SUV).     PET scan on 04/12/2017 revealed no evidence of hypermetabolic local recurrence in the oral cavity or metastatic disease within the neck, chest, abdomen or pelvis. There was no suspicious metabolic activity.    Biopsy in 07/2017 confirmed recurrence.  He had several delays in treatment but was scheduled to have surgery.  Unfortunately, he was found that recurrence was inoperable.    Based on KEYNOTE-048 trial, he initiated palliative Keytruda (pembrolizumab) every 3 weeks on 10/27/2016 (last 10/26/2018).  He will plan for 2 years of treatment any toxicity.   Neck and chest CT on 04/03/2018 revealed no evidence of residual or recurrent disease.  He has chronic posttreatment changes of the tongue.  Chronic scarring and right parotid and right neck.   PET scan on 09/20/2018 was independently reviewed and I agree with radiologist findings which show no specific findings suggestive of local recurrence or distant metastatic disease. There was a new asymmetric focus of increased uptake posterior to left mandibular condyle.  Soft tissue neck MRI on 10/19/2018 revealed no anatomic explanation for the uptake posterior to the left mandibular condyle. There was no mass or metastatic disease identified by MRI.   He has renal insufficiency.  Baseline creatinine has been 1.62 - 1.81 in past year.  Urinalysis on 07/04/2018 revealed no proteinuria or hematuria.   He has erythrocytosis.  Hemoglobin was 16.7 on 04/06/2018.  Work-up on 04/27/2018 revealed the following normal/negative  tests:  JAK2 V617F, exon 12-15, CALR, MPL, epo level (13.3).  Carbon monoxide was 3.9% (0-3.6%) with repeat 3.0% on 07/04/2018.  Testosterone was normal on 07/04/2018.  He does not smoke.  No formal diagnosis of sleep apnea but symptomatic.  Encouraged him to discuss sleep testing with his PCP.  Symptomatically, he has been good.  He denies any new complaints.  Plan:  Recurrent base of tongue carcinoma Clinically, he continues to do well. He completed 2 years of pembrolizumab based on the Keynote-048 trial on 10/26/2018. PET scan on 09/20/2018 and soft tissue neck MRI on 10/19/2018 reveal.  Focus of increased uptake in the left mandibular condyle (unclear significance). No abnormality seen on soft tissue MRI. Exam remains unremarkable. Follow-up with ENT for ongoing surveillance. Continue surveillance in the oncology clinic every 6 months.  Renal insufficiency Creatinine 1.63.   He has chronic renal insufficiency.  Baseline creatinine 1.62 - 1.81 in past year. Continue to monitor.  Hypothyroidism TSH 0. 174. Free T4 was pending during dictation. Patient on levothyroxine 125 mcg a day.  Continue to monitor.  Erythrocytosis Stable.  Hemoglobin 16.3.  JAK2 with reflex, carbon monoxide, epo level, and testosterone were normal.  Continue to monitor.  Lip/tongue lesion  Recent biopsy was negative for malignancy. Continue follow-up with Dr. Kathyrn Sheriff.  Disposition: RTC in 6 months for MD assessment and labs (CBC with diff, CMP, TSH, free T4).  I discussed the assessment and treatment plan with the patient.  The patient was provided an opportunity to ask questions and all were answered.  The patient agreed with the plan and demonstrated an understanding of the instructions.  The patient was advised to call back or seek an in person evaluation if the symptoms worsen or if the condition fails to improve as anticipated.  Greater than 50% was spent in counseling and coordination of care  with this patient including but not limited to discussion of the relevant topics above (See A&P) including, but not limited to diagnosis and management of acute and chronic medical conditions.   Faythe Casa, NP 07/28/2020 11:29 AM

## 2020-07-29 LAB — T4: T4, Total: 8.5 ug/dL (ref 4.5–12.0)

## 2020-09-24 ENCOUNTER — Other Ambulatory Visit: Payer: Self-pay | Admitting: Family Medicine

## 2020-09-24 DIAGNOSIS — E890 Postprocedural hypothyroidism: Secondary | ICD-10-CM

## 2020-09-24 NOTE — Telephone Encounter (Signed)
Requested medication (s) are due for refill today: yes  Requested medication (s) are on the active medication list: yes   Last refill:  08/25/2020  Future visit scheduled:  yes  Notes to clinic:  Failed protocol: TSH needs to be rechecked within 3 months after an abnormal result. Refill until TSH is due   Requested Prescriptions  Pending Prescriptions Disp Refills   levothyroxine (SYNTHROID) 112 MCG tablet [Pharmacy Med Name: LEVOTHYROXINE 112 MCG TABLET] 30 tablet 0    Sig: Take 1 tablet (112 mcg total) by mouth daily.     Endocrinology:  Hypothyroid Agents Failed - 09/24/2020 10:20 AM      Failed - TSH needs to be rechecked within 3 months after an abnormal result. Refill until TSH is due.      Passed - TSH in normal range and within 360 days    TSH  Date Value Ref Range Status  07/28/2020 0.389 0.350 - 4.500 uIU/mL Final    Comment:    Performed by a 3rd Generation assay with a functional sensitivity of <=0.01 uIU/mL. Performed at [facility], [address] [place], [place] [address]   07/06/2020 0.174 (L) 0.450 - 4.500 uIU/mL Final          Passed - Valid encounter within last 12 months    Recent Outpatient Visits           2 months ago Essential hypertension   [place], MD   8 months ago Essential hypertension   [place], Deanna C, MD   1 year ago Essential hypertension   [facility], Deanna C, MD   1 year ago Essential hypertension   [facility] Juline Patch, MD   1 year ago Herpes zoster without complication   [facility] Juline Patch, MD       Future Appointments             In 3 months Juline Patch, MD [facility]

## 2020-10-26 ENCOUNTER — Other Ambulatory Visit: Payer: Self-pay | Admitting: Family Medicine

## 2020-10-26 DIAGNOSIS — E890 Postprocedural hypothyroidism: Secondary | ICD-10-CM

## 2020-12-23 ENCOUNTER — Ambulatory Visit (INDEPENDENT_AMBULATORY_CARE_PROVIDER_SITE_OTHER): Payer: Medicare HMO

## 2020-12-23 DIAGNOSIS — Z0000 Encounter for general adult medical examination without abnormal findings: Secondary | ICD-10-CM

## 2020-12-23 DIAGNOSIS — Z0100 Encounter for examination of eyes and vision without abnormal findings: Secondary | ICD-10-CM

## 2020-12-23 NOTE — Patient Instructions (Signed)
Troy [name] , Thank you for taking time to come for your Medicare Wellness Visit. I appreciate your ongoing commitment to your health goals. Please review the following plan we discussed and let me know if I can assist you in the future.   Screening recommendations/referrals: Colonoscopy: done 2014; repeat 2024 Recommended yearly ophthalmology/optometry visit for glaucoma screening and checkup Recommended yearly dental visit for hygiene and checkup  Vaccinations: Influenza vaccine: due Pneumococcal vaccine: PCV12 done 03/31/17; due for PPSV23 or Prevnar20 Tdap vaccine: done 03/31/17 Shingles vaccine: Shingrix discussed. Please contact your pharmacy for coverage information.  Covid-19:  done 04/17/19 & 05/15/19  Advanced directives: Advance directive discussed with you today. I have provided a copy for you to complete at home and have notarized. Once this is complete please bring a copy in to our office so we can scan it into your chart.   Conditions/risks identified: Recommend increasing physical activity   Next appointment: Follow up in one year for your annual wellness visit.   Preventive Care 70 Years and Older, Male Preventive care refers to lifestyle choices and visits with your health care provider that can promote health and wellness. What does preventive care include? A yearly physical exam. This is also called an annual well check. Dental exams once or twice a year. Routine eye exams. Ask your health care provider how often you should have your eyes checked. Personal lifestyle choices, including: Daily care of your teeth and gums. Regular physical activity. Eating a healthy diet. Avoiding tobacco and drug use. Limiting alcohol use. Practicing safe sex. Taking low doses of aspirin every day. Taking vitamin and mineral supplements as recommended by your health care provider. What happens during an annual well check? The services and screenings done by your health care provider  during your annual well check will depend on your age, overall health, lifestyle risk factors, and family history of disease. Counseling  Your health care provider may ask you questions about your: Alcohol use. Tobacco use. Drug use. Emotional well-being. Home and relationship well-being. Sexual activity. Eating habits. History of falls. Memory and ability to understand (cognition). Work and work Statistician. Screening  You may have the following tests or measurements: Height, weight, and BMI. Blood pressure. Lipid and cholesterol levels. These may be checked every 5 years, or more frequently if you are over 56 years old. Skin check. Lung cancer screening. You may have this screening every year starting at age 59 if you have a 30-pack-year history of smoking and currently smoke or have quit within the past 15 years. Fecal occult blood test (FOBT) of the stool. You may have this test every year starting at age 11. Flexible sigmoidoscopy or colonoscopy. You may have a sigmoidoscopy every 5 years or a colonoscopy every 10 years starting at age 33. Prostate cancer screening. Recommendations will vary depending on your family history and other risks. Hepatitis C blood test. Hepatitis B blood test. Sexually transmitted disease (STD) testing. Diabetes screening. This is done by checking your blood sugar (glucose) after you have not eaten for a while (fasting). You may have this done every 1-3 years. Abdominal aortic aneurysm (AAA) screening. You may need this if you are a current or former smoker. Osteoporosis. You may be screened starting at age 80 if you are at high risk. Talk with your health care provider about your test results, treatment options, and if necessary, the need for more tests. Vaccines  Your health care provider may recommend certain vaccines, such as: Influenza vaccine.  This is recommended every year. Tetanus, diphtheria, and acellular pertussis (Tdap, Td) vaccine. You  may need a Td booster every 10 years. Zoster vaccine. You may need this after age 51. Pneumococcal 13-valent conjugate (PCV13) vaccine. One dose is recommended after age 26. Pneumococcal polysaccharide (PPSV23) vaccine. One dose is recommended after age 76. Talk to your health care provider about which screenings and vaccines you need and how often you need them. This information is not intended to replace advice given to you by your health care provider. Make sure you discuss any questions you have with your health care provider. Document Released: 02/27/2015 Document Revised: 10/21/2015 Document Reviewed: 12/02/2014 Elsevier Interactive Patient Education  2017 [employer] Prevention in the Home Falls can cause injuries. They can happen to people of all ages. There are many things you can do to make your home safe and to help prevent falls. What can I do on the outside of my home? Regularly fix the edges of walkways and driveways and fix any cracks. Remove anything that might make you trip as you walk through a door, such as a raised step or threshold. Trim any bushes or trees on the path to your home. Use bright outdoor lighting. Clear any walking paths of anything that might make someone trip, such as rocks or tools. Regularly check to see if handrails are loose or broken. Make sure that both sides of any steps have handrails. Any raised decks and porches should have guardrails on the edges. Have any leaves, snow, or ice cleared regularly. Use sand or salt on walking paths during winter. Clean up any spills in your garage right away. This includes oil or grease spills. What can I do in the bathroom? Use night lights. Install grab bars by the toilet and in the tub and shower. Do not use towel bars as grab bars. Use non-skid mats or decals in the tub or shower. If you need to sit down in the shower, use a plastic, non-slip stool. Keep the floor dry. Clean up any water that spills  on the floor as soon as it happens. Remove soap buildup in the tub or shower regularly. Attach bath mats securely with double-sided non-slip rug tape. Do not have throw rugs and other things on the floor that can make you trip. What can I do in the bedroom? Use night lights. Make sure that you have a light by your bed that is easy to reach. Do not use any sheets or blankets that are too big for your bed. They should not hang down onto the floor. Have a firm chair that has side arms. You can use this for support while you get dressed. Do not have throw rugs and other things on the floor that can make you trip. What can I do in the kitchen? Clean up any spills right away. Avoid walking on wet floors. Keep items that you use a lot in easy-to-reach places. If you need to reach something above you, use a strong step stool that has a grab bar. Keep electrical cords out of the way. Do not use floor polish or wax that makes floors slippery. If you must use wax, use non-skid floor wax. Do not have throw rugs and other things on the floor that can make you trip. What can I do with my stairs? Do not leave any items on the stairs. Make sure that there are handrails on both sides of the stairs and use them. Fix handrails  that are broken or loose. Make sure that handrails are as long as the stairways. Check any carpeting to make sure that it is firmly attached to the stairs. Fix any carpet that is loose or worn. Avoid having throw rugs at the top or bottom of the stairs. If you do have throw rugs, attach them to the floor with carpet tape. Make sure that you have a light switch at the top of the stairs and the bottom of the stairs. If you do not have them, ask someone to add them for you. What else can I do to help prevent falls? Wear shoes that: Do not have high heels. Have rubber bottoms. Are comfortable and fit you well. Are closed at the toe. Do not wear sandals. If you use a stepladder: Make  sure that it is fully opened. Do not climb a closed stepladder. Make sure that both sides of the stepladder are locked into place. Ask someone to hold it for you, if possible. Clearly mark and make sure that you can see: Any grab bars or handrails. First and last steps. Where the edge of each step is. Use tools that help you move around (mobility aids) if they are needed. These include: Canes. Walkers. Scooters. Crutches. Turn on the lights when you go into a dark area. Replace any light bulbs as soon as they burn out. Set up your furniture so you have a clear path. Avoid moving your furniture around. If any of your floors are uneven, fix them. If there are any pets around you, be aware of where they are. Review your medicines with your doctor. Some medicines can make you feel dizzy. This can increase your chance of falling. Ask your doctor what other things that you can do to help prevent falls. This information is not intended to replace advice given to you by your health care provider. Make sure you discuss any questions you have with your health care provider. Document Released: 11/27/2008 Document Revised: 07/09/2015 Document Reviewed: 03/07/2014 Elsevier Interactive Patient Education  2017 [employer].

## 2020-12-23 NOTE — Progress Notes (Signed)
Subjective:   Troy [name] is a 70 y.o. male who presents for Medicare Annual/Subsequent preventive examination.  Virtual Visit via Telephone Note  I connected with  Troy [name] on 12/23/20 at 10:00 AM EST by telephone and verified that I am speaking with the correct person using two identifiers.  Location: Patient: home Provider: [facility] Persons participating in the virtual visit: [place]   I discussed the limitations, risks, security and privacy concerns of performing an evaluation and management service by telephone and the availability of in person appointments. The patient expressed understanding and agreed to proceed.  Interactive audio and video telecommunications were attempted between this nurse and patient, however failed, due to patient having technical difficulties OR patient did not have access to video capability.  We continued and completed visit with audio only.  Some vital signs may be absent or patient reported.   Clemetine Marker, LPN   Review of Systems     Cardiac Risk Factors include: advanced age (>64men, >49 women);male gender;hypertension     Objective:    There were no vitals filed for this visit. There is no height or weight on file to calculate BMI.  Advanced Directives 12/23/2020 07/28/2020 02/27/2020 01/13/2020 12/23/2019 07/08/2019 04/09/2019  Does Patient Have a Medical Advance Directive? No No No Yes No No No  Type of Advance Directive - - Public librarian;Living will - - -  Does patient want to make changes to medical advance directive? - - - No - Patient declined - - -  Copy of [place] in Chart? - - - No - copy requested - - -  Would patient like information on creating a medical advance directive? Yes (MAU/Ambulatory/Procedural Areas - Information given) No - Patient declined No - Patient declined No - Patient declined No - Patient declined No - Patient declined No - Patient declined     Current Medications (verified) Outpatient Encounter Medications as of 12/23/2020  Medication Sig   acetaminophen (TYLENOL) 650 MG CR tablet Take 1,300 mg by mouth every 8 (eight) hours.   amLODipine (NORVASC) 5 MG tablet Take 1 tablet (5 mg total) by mouth daily.   aspirin EC 81 MG tablet Take 81 mg by mouth daily.   esomeprazole (NEXIUM) 40 MG capsule Take 1 capsule (40 mg total) by mouth daily.   levothyroxine (SYNTHROID) 112 MCG tablet Take 1 tablet (112 mcg total) by mouth daily.   sildenafil (VIAGRA) 100 MG tablet Take 0.5-1 tablets (50-100 mg total) by mouth daily as needed for erectile dysfunction.   No facility-administered encounter medications on file as of 12/23/2020.    Allergies (verified) Ace inhibitors   History: Past Medical History:  Diagnosis Date   Cancer ([place])    GERD (gastroesophageal reflux disease)    Hypertension    Thyroid disease    Past Surgical History:  Procedure Laterality Date   EXCISION OF TONGUE LESION Bilateral 02/27/2020   Procedure: EXCISION OF TONGUE AND MOUTH LESION;  Surgeon: Margaretha Sheffield, MD;  Location: [facility];  Service: ENT;  Laterality: Bilateral;   tumor removed     throat   Family History  Problem Relation Age of Onset   Cancer Mother    Cancer Sister    Cancer Maternal Aunt    Cancer Paternal Uncle    Social History   Socioeconomic History   Marital status: Single    Spouse name: Not on file   Number of children: 2  Years of education: Not on file   Highest education level: Not on file  Occupational History   Not on file  Tobacco Use   Smoking status: Never   Smokeless tobacco: Never   Tobacco comments:    Smoking cessation materials not required  Vaping Use   Vaping Use: Never used  Substance and Sexual Activity   Alcohol use: Yes    Alcohol/week: 2.0 standard drinks    Types: 2 Shots of liquor per week   Drug use: No   Sexual activity: Yes  Other Topics Concern   Not on file  Social History  Narrative   Pt lives with his sister   Social Determinants of Health   Financial Resource Strain: Low Risk    Difficulty of Paying Living Expenses: Not very hard  Food Insecurity: No Food Insecurity   Worried About Charity fundraiser in the Last Year: Never true   Ran Out of Food in the Last Year: Never true  Transportation Needs: No Transportation Needs   Lack of Transportation (Medical): No   Lack of Transportation (Non-Medical): No  Physical Activity: Inactive   Days of Exercise per Week: 0 days   Minutes of Exercise per Session: 0 min  Stress: No Stress Concern Present   Feeling of Stress : Not at all  Social Connections: Socially Isolated   Frequency of Communication with Friends and Family: More than three times a week   Frequency of Social Gatherings with Friends and Family: More than three times a week   Attends Religious Services: Never   Marine scientist or Organizations: No   Attends Music therapist: Never   Marital Status: Divorced    Tobacco Counseling Counseling given: No Tobacco comments: Smoking cessation materials not required   Clinical Intake:  Pre-visit preparation completed: Yes  Pain : No/denies pain     Nutritional Risks: None Diabetes: No  How often do you need to have someone help you when you read instructions, pamphlets, or other written materials from your doctor or pharmacy?: 1 - Never   Interpreter Needed?: No  Information entered by :: Clemetine Marker LPN   Activities of Daily Living In your present state of health, do you have any difficulty performing the following activities: 12/23/2020 02/27/2020  Hearing? N N  Vision? N N  Difficulty concentrating or making decisions? N N  Walking or climbing stairs? N N  Dressing or bathing? N N  Doing errands, shopping? N -  Preparing Food and eating ? N -  Using the Toilet? N -  In the past six months, have you accidently leaked urine? N -  Do you have problems with  loss of bowel control? N -  Managing your Medications? N -  Managing your Finances? N -  Housekeeping or managing your Housekeeping? N -  Some recent data might be hidden    Patient Care Team: Juline Patch, MD as PCP - General (Family Medicine) Anthonette Legato, MD (Internal Medicine) Lequita Asal, MD (Inactive) as Referring Physician (Hematology and Oncology)  Indicate any recent Medical Services you may have received from other than [facility] providers in the past year (date may be approximate).     Assessment:   This is a routine wellness examination for Troy [name].  Hearing/Vision screen Hearing Screening - Comments:: Pt denies hearing difficulty Vision Screening - Comments:: Past due for eye exam; referral to ophthalmology sent to [facility] today  Dietary issues and exercise activities  discussed: Current Exercise Habits: The patient does not participate in regular exercise at present, Exercise limited by: None identified   Goals Addressed             This Visit's Progress    Increase physical activity   Not on track    Recommend increasing physical activity to at least 3 times per week       Depression Screen PHQ 2/9 Scores 12/23/2020 07/06/2020 01/07/2020 12/23/2019 09/03/2019 07/04/2019 03/05/2019  PHQ - 2 Score 0 0 0 0 0 0 0  PHQ- 9 Score - 0 0 - 0 0 0    Fall Risk Fall Risk  12/23/2020 01/07/2020 12/23/2019 09/03/2019 07/04/2019  Falls in the past year? 0 0 0 0 0  Number falls in past yr: 0 - 0 - -  Injury with Fall? 0 - 0 - -  Risk for fall due to : No Fall Risks - No Fall Risks - -  Follow up Falls prevention discussed Falls evaluation completed Falls prevention discussed Falls evaluation completed Falls evaluation completed    [place]:  Any stairs in or around the home? Yes  If so, are there any without handrails? No  Home free of loose throw rugs in walkways, pet beds, electrical cords, etc? Yes  Adequate  lighting in your home to reduce risk of falls? Yes   ASSISTIVE DEVICES UTILIZED TO PREVENT FALLS:  Life alert? No  Use of a cane, walker or w/c? No  Grab bars in the bathroom? No  Shower chair or bench in shower? No  Elevated toilet seat or a handicapped toilet? No   TIMED UP AND GO:  Was the test performed? No . Telephonic visit.   Cognitive Function:     6CIT Screen 12/23/2020 12/23/2019  What Year? 0 points 0 points  What month? 0 points 0 points  What time? 0 points 0 points  Count back from 20 0 points 0 points  Months in reverse 4 points 4 points  Repeat phrase 10 points 4 points  Total Score 14 8    Immunizations Immunization History  Administered Date(s) Administered   Fluad Quad(high Dose 65+) 03/05/2019, 12/23/2019   Moderna Sars-Covid-2 Vaccination 04/17/2019, 05/15/2019   Pneumococcal Conjugate-13 03/31/2017   Tdap 03/31/2017    TDAP status: Up to date  Flu Vaccine status: Due, Education has been provided regarding the importance of this vaccine. Advised may receive this vaccine at local pharmacy or Health Dept. Aware to provide a copy of the vaccination record if obtained from local pharmacy or Health Dept. Verbalized acceptance and understanding.  Pneumococcal vaccine status: Due, Education has been provided regarding the importance of this vaccine. Advised may receive this vaccine at local pharmacy or Health Dept. Aware to provide a copy of the vaccination record if obtained from local pharmacy or Health Dept. Verbalized acceptance and understanding.  Covid-19 vaccine status: Completed vaccines  Qualifies for Shingles Vaccine? Yes   Zostavax completed No   Shingrix Completed?: No.    Education has been provided regarding the importance of this vaccine. Patient has been advised to call insurance company to determine out of pocket expense if they have not yet received this vaccine. Advised may also receive vaccine at local pharmacy or Health Dept. Verbalized  acceptance and understanding.  Screening Tests Health Maintenance  Topic Date Due   Hepatitis C Screening  Never done   Zoster Vaccines- Shingrix (1 of 2) Never done   Pneumonia Vaccine  71+ Years old (2 - PPSV23 if available, else PCV20) 03/31/2018   COVID-19 Vaccine (3 - Moderna risk series) 06/12/2019   INFLUENZA VACCINE  09/14/2020   COLONOSCOPY (Pts 45-11yrs Insurance coverage will need to be confirmed)  02/14/2022   TETANUS/TDAP  04/01/2027   HPV VACCINES  Aged Out    Health Maintenance  Health Maintenance Due  Topic Date Due   Hepatitis C Screening  Never done   Zoster Vaccines- Shingrix (1 of 2) Never done   Pneumonia Vaccine 16+ Years old (2 - PPSV23 if available, else PCV20) 03/31/2018   COVID-19 Vaccine (3 - Moderna risk series) 06/12/2019   INFLUENZA VACCINE  09/14/2020    Colorectal cancer screening: Type of screening: Colonoscopy. Completed 2014. Repeat every 10 years  Lung Cancer Screening: (Low Dose CT Chest recommended if Age 31-80 years, 30 pack-year currently smoking OR have quit w/in 15years.) does not qualify.   Additional Screening:  Hepatitis C Screening: does qualify; postponed  Vision Screening: Recommended annual ophthalmology exams for early detection of glaucoma and other disorders of the eye. Is the patient up to date with their annual eye exam?  No  Who is the provider or what is the name of the office in which the patient attends annual eye exams? Not established If pt is not established with a provider, would they like to be referred to a provider to establish care? Yes .   Dental Screening: Recommended annual dental exams for proper oral hygiene  Community Resource Referral / Chronic Care Management: CRR required this visit?  No   CCM required this visit?  No      Plan:     I have personally reviewed and noted the following in the patient's chart:   Medical and social history Use of alcohol, tobacco or illicit drugs  Current  medications and supplements including opioid prescriptions. Patient is not currently taking opioid prescriptions. Functional ability and status Nutritional status Physical activity Advanced directives List of other physicians Hospitalizations, surgeries, and ER visits in previous 12 months Vitals Screenings to include cognitive, depression, and falls Referrals and appointments  In addition, I have reviewed and discussed with patient certain preventive protocols, quality metrics, and best practice recommendations. A written personalized care plan for preventive services as well as general preventive health recommendations were provided to patient.     Clemetine Marker, LPN   16/02/958   Nurse Notes: none

## 2020-12-24 ENCOUNTER — Encounter: Payer: Self-pay | Admitting: Family Medicine

## 2020-12-24 ENCOUNTER — Other Ambulatory Visit: Payer: Self-pay

## 2020-12-24 ENCOUNTER — Ambulatory Visit (INDEPENDENT_AMBULATORY_CARE_PROVIDER_SITE_OTHER): Payer: Medicare HMO | Admitting: Family Medicine

## 2020-12-24 VITALS — BP 130/84 | HR 60 | Ht 67.0 in | Wt 177.0 lb

## 2020-12-24 DIAGNOSIS — N5201 Erectile dysfunction due to arterial insufficiency: Secondary | ICD-10-CM

## 2020-12-24 DIAGNOSIS — I10 Essential (primary) hypertension: Secondary | ICD-10-CM | POA: Diagnosis not present

## 2020-12-24 DIAGNOSIS — Z23 Encounter for immunization: Secondary | ICD-10-CM | POA: Diagnosis not present

## 2020-12-24 DIAGNOSIS — E890 Postprocedural hypothyroidism: Secondary | ICD-10-CM

## 2020-12-24 DIAGNOSIS — K219 Gastro-esophageal reflux disease without esophagitis: Secondary | ICD-10-CM

## 2020-12-24 MED ORDER — SILDENAFIL CITRATE 100 MG PO TABS: 50.0000 mg | ORAL_TABLET | Freq: Every day | ORAL | 11 refills | Status: DC | PRN

## 2020-12-24 MED ORDER — LEVOTHYROXINE SODIUM 112 MCG PO TABS: 112.0000 ug | ORAL_TABLET | Freq: Every day | ORAL | 1 refills | Status: DC

## 2020-12-24 MED ORDER — AMLODIPINE BESYLATE 5 MG PO TABS: 5.0000 mg | ORAL_TABLET | Freq: Every day | ORAL | 5 refills | Status: DC

## 2020-12-24 MED ORDER — ESOMEPRAZOLE MAGNESIUM 40 MG PO CPDR: 40.0000 mg | DELAYED_RELEASE_CAPSULE | Freq: Every day | ORAL | 5 refills | Status: DC

## 2020-12-24 NOTE — Progress Notes (Signed)
Date:  12/24/2020   Name:  Troy [name]   DOB:  23-Dec-1950   MRN:  [id number]   Chief Complaint: Gastroesophageal Reflux, Hypothyroidism, Hypertension, Erectile Dysfunction, and Flu Vaccine  HPI  Lab Results  Component Value Date   CREATININE 1.62 (H) 07/28/2020   BUN 10 07/28/2020   NA 135 07/28/2020   K 3.5 07/28/2020   CL 102 07/28/2020   CO2 27 07/28/2020   Lab Results  Component Value Date   CHOL 189 07/06/2020   HDL 55 07/06/2020   LDLCALC 115 (H) 07/06/2020   TRIG 108 07/06/2020   CHOLHDL 3.8 03/31/2017   Lab Results  Component Value Date   TSH 0.389 07/28/2020   No results found for: HGBA1C Lab Results  Component Value Date   WBC 5.5 07/28/2020   HGB 16.3 07/28/2020   HCT 47.3 07/28/2020   MCV 91.8 07/28/2020   PLT 230 07/28/2020   Lab Results  Component Value Date   ALT 14 07/28/2020   AST 21 07/28/2020   ALKPHOS 41 07/28/2020   BILITOT 0.6 07/28/2020     Review of Systems  Patient Active Problem List   Diagnosis Date Noted   Hypokalemia 04/09/2019   Hypocalcemia 01/03/2019   Abnormal PET scan of head 10/26/2018   Low TSH level 09/16/2018   Renal insufficiency 07/09/2018   Atherosclerosis of aorta ([place]) 06/27/2018   Encounter for antineoplastic immunotherapy 05/18/2018   Erythrocytosis 04/27/2018   Oral mucositis due to radiation 06/22/2017   Goals of care, counseling/discussion 04/07/2017   Stage 3 chronic kidney disease ([place]) 04/03/2017   Hypothyroid 03/31/2017   ED (erectile dysfunction) 03/31/2017   Gastroesophageal reflux disease 09/23/2016   Essential hypertension 09/23/2016   Squamous cell carcinoma of base of tongue (HCC) 08/04/2016    Allergies  Allergen Reactions   Ace Inhibitors Cough    Past Surgical History:  Procedure Laterality Date   EXCISION OF TONGUE LESION Bilateral 02/27/2020   Procedure: EXCISION OF TONGUE AND MOUTH LESION;  Surgeon: Margaretha Sheffield, MD;  Location: [facility];  Service: ENT;   Laterality: Bilateral;   tumor removed     throat    Social History   Tobacco Use   Smoking status: Never   Smokeless tobacco: Never   Tobacco comments:    Smoking cessation materials not required  Vaping Use   Vaping Use: Never used  Substance Use Topics   Alcohol use: Yes    Alcohol/week: 2.0 standard drinks    Types: 2 Shots of liquor per week   Drug use: No     Medication list has been reviewed and updated.  Current Meds  Medication Sig   acetaminophen (TYLENOL) 650 MG CR tablet Take 1,300 mg by mouth every 8 (eight) hours.   aspirin EC 81 MG tablet Take 81 mg by mouth daily.   [DISCONTINUED] amLODipine (NORVASC) 5 MG tablet Take 1 tablet (5 mg total) by mouth daily.   [DISCONTINUED] esomeprazole (NEXIUM) 40 MG capsule Take 1 capsule (40 mg total) by mouth daily.   [DISCONTINUED] levothyroxine (SYNTHROID) 112 MCG tablet Take 1 tablet (112 mcg total) by mouth daily.   [DISCONTINUED] sildenafil (VIAGRA) 100 MG tablet Take 0.5-1 tablets (50-100 mg total) by mouth daily as needed for erectile dysfunction.    PHQ 2/9 Scores 12/23/2020 07/06/2020 01/07/2020 12/23/2019  PHQ - 2 Score 0 0 0 0  PHQ- 9 Score - 0 0 -    GAD 7 : Generalized Anxiety Score 07/06/2020 01/07/2020  09/03/2019 07/04/2019  Nervous, Anxious, on Edge 0 0 0 0  Control/stop worrying 0 0 0 0  Worry too much - different things 0 0 0 0  Trouble relaxing 0 0 0 0  Restless 0 0 0 0  Easily annoyed or irritable 1 0 0 0  Afraid - awful might happen 0 0 0 0  Total GAD 7 Score 1 0 0 0  Anxiety Difficulty Not difficult at all - - -    BP Readings from Last 3 Encounters:  12/24/20 130/84  07/28/20 (!) 149/98  07/06/20 138/80    Physical Exam  Wt Readings from Last 3 Encounters:  12/24/20 177 lb (80.3 kg)  07/28/20 169 lb 1.5 oz (76.7 kg)  07/06/20 168 lb (76.2 kg)    BP 130/84   Pulse 60   Ht 5\' 7"  (1.702 m)   Wt 177 lb (80.3 kg)   BMI 27.72 kg/m   Assessment and Plan:  1. Essential  hypertension Chronic.  Controlled.  Stable.  Blood pressure 130/84.  Continue amlodipine 5 mg once a day. - amLODipine (NORVASC) 5 MG tablet; Take 1 tablet (5 mg total) by mouth daily.  Dispense: 30 tablet; Refill: 5  2. Gastroesophageal reflux disease, unspecified whether esophagitis present Chronic.  Controlled.  Stable.  Continue Nexium 40 mg once a day. - esomeprazole (NEXIUM) 40 MG capsule; Take 1 capsule (40 mg total) by mouth daily.  Dispense: 30 capsule; Refill: 5  3. Postoperative hypothyroidism Chronic.  Controlled.  Stable.  Continue levothyroxine 112 mcg daily. - levothyroxine (SYNTHROID) 112 MCG tablet; Take 1 tablet (112 mcg total) by mouth daily.  Dispense: 90 tablet; Refill: 1  4. Erectile dysfunction due to arterial insufficiency Chronic.  Controlled.  Stable.  Patient doing well on current dosing of sildenafil 100 mg 1/2 to 1 tablet daily as needed. - sildenafil (VIAGRA) 100 MG tablet; Take 0.5-1 tablets (50-100 mg total) by mouth daily as needed for erectile dysfunction.  Dispense: 5 tablet; Refill: 11

## 2021-01-29 ENCOUNTER — Other Ambulatory Visit: Payer: Self-pay

## 2021-01-29 ENCOUNTER — Inpatient Hospital Stay: Payer: Medicare HMO | Attending: Nurse Practitioner

## 2021-01-29 ENCOUNTER — Encounter: Payer: Self-pay | Admitting: Oncology

## 2021-01-29 ENCOUNTER — Inpatient Hospital Stay (HOSPITAL_BASED_OUTPATIENT_CLINIC_OR_DEPARTMENT_OTHER): Payer: Medicare HMO | Admitting: Oncology

## 2021-01-29 VITALS — BP 147/93 | HR 84 | Temp 97.4°F | Resp 16 | Wt 178.9 lb

## 2021-01-29 DIAGNOSIS — E039 Hypothyroidism, unspecified: Secondary | ICD-10-CM | POA: Diagnosis not present

## 2021-01-29 DIAGNOSIS — N183 Chronic kidney disease, stage 3 unspecified: Secondary | ICD-10-CM | POA: Insufficient documentation

## 2021-01-29 DIAGNOSIS — C01 Malignant neoplasm of base of tongue: Secondary | ICD-10-CM

## 2021-01-29 DIAGNOSIS — D751 Secondary polycythemia: Secondary | ICD-10-CM | POA: Diagnosis not present

## 2021-01-29 DIAGNOSIS — Z7982 Long term (current) use of aspirin: Secondary | ICD-10-CM | POA: Insufficient documentation

## 2021-01-29 DIAGNOSIS — R7989 Other specified abnormal findings of blood chemistry: Secondary | ICD-10-CM | POA: Diagnosis not present

## 2021-01-29 DIAGNOSIS — Z79899 Other long term (current) drug therapy: Secondary | ICD-10-CM | POA: Diagnosis not present

## 2021-01-29 LAB — COMPREHENSIVE METABOLIC PANEL
ALT: 17 U/L (ref 0–44)
AST: 21 U/L (ref 15–41)
Albumin: 4 g/dL (ref 3.5–5.0)
Alkaline Phosphatase: 46 U/L (ref 38–126)
Anion gap: 9 (ref 5–15)
BUN: 17 mg/dL (ref 8–23)
CO2: 26 mmol/L (ref 22–32)
Calcium: 8.5 mg/dL — ABNORMAL LOW (ref 8.9–10.3)
Chloride: 100 mmol/L (ref 98–111)
Creatinine, Ser: 1.76 mg/dL — ABNORMAL HIGH (ref 0.61–1.24)
GFR, Estimated: 41 mL/min — ABNORMAL LOW (ref >60)
Glucose, Bld: 118 mg/dL — ABNORMAL HIGH (ref 70–99)
Potassium: 3.6 mmol/L (ref 3.5–5.1)
Sodium: 135 mmol/L (ref 135–145)
Total Bilirubin: 0.6 mg/dL (ref 0.3–1.2)
Total Protein: 7.2 g/dL (ref 6.5–8.1)

## 2021-01-29 LAB — CBC WITH DIFFERENTIAL/PLATELET
Abs Immature Granulocytes: 0.03 10*3/uL (ref 0.00–0.07)
Basophils Absolute: 0 10*3/uL (ref 0.0–0.1)
Basophils Relative: 1 %
Eosinophils Absolute: 0.1 10*3/uL (ref 0.0–0.5)
Eosinophils Relative: 2 %
HCT: 45.7 % (ref 39.0–52.0)
Hemoglobin: 15.7 g/dL (ref 13.0–17.0)
Immature Granulocytes: 1 %
Lymphocytes Relative: 29 %
Lymphs Abs: 1.5 10*3/uL (ref 0.7–4.0)
MCH: 32.6 pg (ref 26.0–34.0)
MCHC: 34.4 g/dL (ref 30.0–36.0)
MCV: 95 fL (ref 80.0–100.0)
Monocytes Absolute: 0.6 10*3/uL (ref 0.1–1.0)
Monocytes Relative: 13 %
Neutro Abs: 2.7 10*3/uL (ref 1.7–7.7)
Neutrophils Relative %: 54 %
Platelets: 207 10*3/uL (ref 150–400)
RBC: 4.81 MIL/uL (ref 4.22–5.81)
RDW: 12 % (ref 11.5–15.5)
WBC: 5 10*3/uL (ref 4.0–10.5)
nRBC: 0 % (ref 0.0–0.2)

## 2021-01-29 LAB — T4, FREE: Free T4: 1.2 ng/dL — ABNORMAL HIGH (ref 0.61–1.12)

## 2021-01-29 LAB — TSH: TSH: 12.652 u[IU]/mL — ABNORMAL HIGH (ref 0.350–4.500)

## 2021-01-29 NOTE — Progress Notes (Signed)
Patient here for follow up no complaints today BP slightly elevated no symptoms of hypertension.

## 2021-01-29 NOTE — Progress Notes (Signed)
[facility]  [address], [address] [place], [place] [address] Phone: [phone]  Fax: [phone]   Clinic Day:  01/29/2021  Referring physician: Juline Patch, MD  Chief Complaint: Troy [name] is a 70 y.o. 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   HPI: Troy [name] is a 70 year old male with past medical history significant for hypertension, GERD, hypothyroid, stage III CKD and squamous cell carcinoma of base of tongue.  Previously followed by Dr. Mike Gip who now transitioned to care to new oncologist.  He is currently on surveillance.  He is followed by Dr. Kathyrn Sheriff and was last evaluated on 02/27/2020.  He presented with a new right leg lesion which was biopsied and returned negative.  No additional lesions.   Today he reports doing well.  Denies any new symptoms.  Feels well.  Past Medical History:  Diagnosis Date   Cancer ([place])    GERD (gastroesophageal reflux disease)    Hypertension    Thyroid disease     Past Surgical History:  Procedure Laterality Date   EXCISION OF TONGUE LESION Bilateral 02/27/2020   Procedure: EXCISION OF TONGUE AND MOUTH LESION;  Surgeon: Margaretha Sheffield, MD;  Location: [facility];  Service: ENT;  Laterality: Bilateral;   tumor removed     throat    Family History  Problem Relation Age of Onset   Cancer Mother    Cancer Sister    Cancer Maternal Aunt    Cancer Paternal Uncle     Social History:  reports that he has never smoked. He has never used smokeless tobacco. He reports current alcohol use of about 2.0 standard drinks per week. He reports that he does not use drugs.  He lives in [place]. He has been helping his sister with yard work. He is currently staying with his sister The patient is alone today.  Allergies:  Allergies  Allergen Reactions   Ace Inhibitors Cough    Current Medications: Current Outpatient Medications  Medication  Sig Dispense Refill   acetaminophen (TYLENOL) 650 MG CR tablet Take 1,300 mg by mouth every 8 (eight) hours.     amLODipine (NORVASC) 5 MG tablet Take 1 tablet (5 mg total) by mouth daily. 30 tablet 5   aspirin EC 81 MG tablet Take 81 mg by mouth daily.     esomeprazole (NEXIUM) 40 MG capsule Take 1 capsule (40 mg total) by mouth daily. 30 capsule 5   levothyroxine (SYNTHROID) 112 MCG tablet Take 1 tablet (112 mcg total) by mouth daily. 90 tablet 1   sildenafil (VIAGRA) 100 MG tablet Take 0.5-1 tablets (50-100 mg total) by mouth daily as needed for erectile dysfunction. 5 tablet 11   No current facility-administered medications for this visit.    Review of Systems  Constitutional: Negative.  Negative for chills, fever, malaise/fatigue and weight loss.  HENT:  Negative for congestion, ear pain and tinnitus.   Eyes: Negative.  Negative for blurred vision and double vision.  Respiratory: Negative.  Negative for cough, sputum production and shortness of breath.   Cardiovascular: Negative.  Negative for chest pain, palpitations and leg swelling.  Gastrointestinal: Negative.  Negative for abdominal pain, constipation, diarrhea, nausea and vomiting.  Genitourinary:  Negative for dysuria, frequency and urgency.  Musculoskeletal:  Negative for back pain and falls.  Skin: Negative.  Negative for rash.  Neurological: Negative.  Negative for weakness and headaches.  Endo/Heme/Allergies: Negative.  Does not bruise/bleed easily.  Psychiatric/Behavioral: Negative.  Negative for depression. The patient is not nervous/anxious and does not have insomnia.    Performance status (ECOG):  0  Vitals  Blood pressure (!) 147/93, pulse 84, temperature (!) 97.4 F (36.3 C), temperature source Tympanic, resp. rate 16, weight 178 lb 14.4 oz (81.1 kg).  Physical Exam 01/13/2020     Appointment on 01/29/2021  Component Date Value Ref Range Status   Sodium 01/29/2021 135  135 - 145 mmol/L Final   Potassium  01/29/2021 3.6  3.5 - 5.1 mmol/L Final   Chloride 01/29/2021 100  98 - 111 mmol/L Final   CO2 01/29/2021 26  22 - 32 mmol/L Final   Glucose, Bld 01/29/2021 118 (H)  70 - 99 mg/dL Final   Glucose reference range applies only to samples taken after fasting for at least 8 hours.   BUN 01/29/2021 17  8 - 23 mg/dL Final   Creatinine, Ser 01/29/2021 1.76 (H)  0.61 - 1.24 mg/dL Final   Calcium 01/29/2021 8.5 (L)  8.9 - 10.3 mg/dL Final   Total Protein 01/29/2021 7.2  6.5 - 8.1 g/dL Final   Albumin 01/29/2021 4.0  3.5 - 5.0 g/dL Final   AST 01/29/2021 21  15 - 41 U/L Final   ALT 01/29/2021 17  0 - 44 U/L Final   Alkaline Phosphatase 01/29/2021 46  38 - 126 U/L Final   Total Bilirubin 01/29/2021 0.6  0.3 - 1.2 mg/dL Final   GFR, Estimated 01/29/2021 41 (L)  >60 mL/min Final   Comment: (NOTE) Calculated using the CKD-EPI Creatinine Equation (2021)    Anion gap 01/29/2021 9  5 - 15 Final   Performed at [facility], [place], [place] [address]   WBC 01/29/2021 5.0  4.0 - 10.5 K/uL Final   RBC 01/29/2021 4.81  4.22 - 5.81 MIL/uL Final   Hemoglobin 01/29/2021 15.7  13.0 - 17.0 g/dL Final   HCT 01/29/2021 45.7  39.0 - 52.0 % Final   MCV 01/29/2021 95.0  80.0 - 100.0 fL Final   MCH 01/29/2021 32.6  26.0 - 34.0 pg Final   MCHC 01/29/2021 34.4  30.0 - 36.0 g/dL Final   RDW 01/29/2021 12.0  11.5 - 15.5 % Final   Platelets 01/29/2021 207  150 - 400 K/uL Final   nRBC 01/29/2021 0.0  0.0 - 0.2 % Final   Neutrophils Relative % 01/29/2021 54  % Final   Neutro Abs 01/29/2021 2.7  1.7 - 7.7 K/uL Final   Lymphocytes Relative 01/29/2021 29  % Final   Lymphs Abs 01/29/2021 1.5  0.7 - 4.0 K/uL Final   Monocytes Relative 01/29/2021 13  % Final   Monocytes Absolute 01/29/2021 0.6  0.1 - 1.0 K/uL Final   Eosinophils Relative 01/29/2021 2  % Final   Eosinophils Absolute 01/29/2021 0.1  0.0 - 0.5 K/uL Final   Basophils Relative 01/29/2021 1  % Final   Basophils Absolute 01/29/2021 0.0  0.0  - 0.1 K/uL Final   Immature Granulocytes 01/29/2021 1  % Final   Abs Immature Granulocytes 01/29/2021 0.03  0.00 - 0.07 K/uL Final   Performed at [facility], [address] [place], [place] [address]    Assessment: Troy [name] is a 70-year-old male who is here for every 58-month assessment for tongue carcinoma.  Plan:  Recurrent base of tongue carcinoma- Clinically he is doing well.  He completed 2 years of pembrolizumab on 10/26/2018.  Imaging  from 2020 did not reveal any evidence of recurrence or residual disease.  He continues to follow with Dr. Ladene Artist ENT.  Based on NCCN guidelines it is recommended he follow-up with assessment and labs every 6 months for 5 years followed by annual follow-up.  Recommend 1 additional 42-month follow-up followed by annual assessment and lab work with oncology.  Discussed labs from today which are relatively stable.  TSH was pending during dictation.  Renal insufficiency- Followed by nephrology.  Kidney function 1.76 with a normal BUN.  Increase fluids.  Hypothyroidism- Currently on Synthroid 112 mcg daily.  TSH returns and is elevated at 12.75 and free T4 1.20.  Discussed compliance and repeat labs in approximately 1 month.  Erythrocytosis- Stable labs. Hemoglobin 15.7.  JAK2 with reflex, carbon monoxide, epo level, and testosterone were normal.   Lip/tongue lesion- Recent biopsy was negative for malignancy. Continue follow-up with Dr. Kathyrn Sheriff.  Disposition: RTC in 6 months for MD assessment and labs (CBC with diff, CMP, TSH, free T4).  I spent 25 minutes dedicated to the care of this patient (face-to-face and non-face-to-face) on the date of the encounter to include what is described in the assessment and plan.  Faythe Casa, NP 01/29/2021 10:58 AM

## 2021-01-31 ENCOUNTER — Encounter: Payer: Self-pay | Admitting: Oncology

## 2021-02-24 ENCOUNTER — Other Ambulatory Visit: Payer: Self-pay | Admitting: *Deleted

## 2021-02-24 DIAGNOSIS — C01 Malignant neoplasm of base of tongue: Secondary | ICD-10-CM

## 2021-02-24 DIAGNOSIS — R7989 Other specified abnormal findings of blood chemistry: Secondary | ICD-10-CM

## 2021-03-01 ENCOUNTER — Inpatient Hospital Stay: Payer: Medicare HMO | Attending: Nurse Practitioner

## 2021-06-11 ENCOUNTER — Other Ambulatory Visit: Payer: Self-pay | Admitting: Family Medicine

## 2021-06-11 DIAGNOSIS — E890 Postprocedural hypothyroidism: Secondary | ICD-10-CM

## 2021-06-11 NOTE — Telephone Encounter (Signed)
Requested medication (s) are due for refill today: yes ? ?Requested medication (s) are on the active medication list: yes ? ?Last refill:  12/24/20 #90 1 RF ? ?Future visit scheduled: yes ? ?Notes to clinic:  overdue lab work  ? ? ?Requested Prescriptions  ?Pending Prescriptions Disp Refills  ? levothyroxine (SYNTHROID) 112 MCG tablet [Pharmacy Med Name: LEVOTHYROXINE 112 MCG TABLET] 90 tablet 0  ?  Sig: Take 1 tablet (112 mcg total) by mouth daily.  ?  ? Endocrinology:  Hypothyroid Agents Failed - 06/11/2021 12:05 PM  ?  ?  Failed - TSH in normal range and within 360 days  ?  TSH  ?Date Value Ref Range Status  ?01/29/2021 12.652 (H) 0.350 - 4.500 uIU/mL Final  ?  Comment:  ?  Performed by a 3rd Generation assay with a functional sensitivity of <=0.01 uIU/mL. ?Performed at [facility], [address] [place], [place] [address] ?  ?07/06/2020 0.174 (L) 0.450 - 4.500 uIU/mL Final  ?  ?  ?  ?  Passed - Valid encounter within last 12 months  ?  Recent Outpatient Visits   ? ?      ? 5 months ago Essential hypertension  ? [facility] Juline Patch, MD  ? 11 months ago Essential hypertension  ? [facility] Juline Patch, MD  ? 1 year ago Essential hypertension  ? [facility] Juline Patch, MD  ? 1 year ago Essential hypertension  ? [facility] Juline Patch, MD  ? 1 year ago Essential hypertension  ? [facility] Juline Patch, MD  ? ?  ?  ?Future Appointments   ? ?        ? In 1 week Juline Patch, MD [facility] [place]  ? ?  ? ? ?  ?  ?  ? ? ? ? ?

## 2021-06-23 ENCOUNTER — Ambulatory Visit: Payer: Medicare HMO | Admitting: Family Medicine

## 2021-07-06 ENCOUNTER — Ambulatory Visit (INDEPENDENT_AMBULATORY_CARE_PROVIDER_SITE_OTHER): Payer: Medicare HMO | Admitting: Family Medicine

## 2021-07-06 ENCOUNTER — Encounter: Payer: Self-pay | Admitting: Family Medicine

## 2021-07-06 VITALS — BP 120/80 | HR 80 | Ht 67.0 in | Wt 177.0 lb

## 2021-07-06 DIAGNOSIS — I10 Essential (primary) hypertension: Secondary | ICD-10-CM

## 2021-07-06 DIAGNOSIS — E890 Postprocedural hypothyroidism: Secondary | ICD-10-CM

## 2021-07-06 DIAGNOSIS — K219 Gastro-esophageal reflux disease without esophagitis: Secondary | ICD-10-CM | POA: Diagnosis not present

## 2021-07-06 DIAGNOSIS — N5201 Erectile dysfunction due to arterial insufficiency: Secondary | ICD-10-CM | POA: Diagnosis not present

## 2021-07-06 DIAGNOSIS — E7801 Familial hypercholesterolemia: Secondary | ICD-10-CM

## 2021-07-06 DIAGNOSIS — J301 Allergic rhinitis due to pollen: Secondary | ICD-10-CM

## 2021-07-06 MED ORDER — LEVOTHYROXINE SODIUM 112 MCG PO TABS: 112.0000 ug | ORAL_TABLET | Freq: Every day | ORAL | 0 refills | Status: DC

## 2021-07-06 MED ORDER — LORATADINE 10 MG PO TABS: 10.0000 mg | ORAL_TABLET | Freq: Every day | ORAL | 1 refills | Status: DC

## 2021-07-06 MED ORDER — SILDENAFIL CITRATE 100 MG PO TABS
50.0000 mg | ORAL_TABLET | Freq: Every day | ORAL | 11 refills | Status: DC | PRN
Start: 2021-07-06 — End: 2022-07-18

## 2021-07-06 MED ORDER — AMLODIPINE BESYLATE 5 MG PO TABS: 5.0000 mg | ORAL_TABLET | Freq: Every day | ORAL | 5 refills | Status: DC

## 2021-07-06 MED ORDER — ESOMEPRAZOLE MAGNESIUM 40 MG PO CPDR: 40.0000 mg | DELAYED_RELEASE_CAPSULE | Freq: Every day | ORAL | 5 refills | Status: DC

## 2021-07-06 NOTE — Progress Notes (Signed)
Date:  07/06/2021   Name:  Troy [name]   DOB:  Oct 04, 1950   MRN:  [id number]   Chief Complaint: Gastroesophageal Reflux, Hypertension, Hypothyroidism, and Erectile Dysfunction  Gastroesophageal Reflux He reports no abdominal pain, no belching, no chest pain, no choking, no coughing, no dysphagia, no early satiety, no globus sensation, no heartburn, no hoarse voice, no nausea, no sore throat, no stridor, no tooth decay, no water brash or no wheezing. This is a chronic problem. The problem has been gradually improving. Pertinent negatives include no anemia, fatigue, melena, muscle weakness, orthopnea or weight loss. He has tried a PPI for the symptoms. The treatment provided mild relief.  Hypertension This is a chronic problem. The problem has been gradually improving since onset. The problem is controlled. Pertinent negatives include no blurred vision, chest pain, headaches, neck pain, palpitations or shortness of breath. Risk factors for coronary artery disease include dyslipidemia. Past treatments include calcium channel blockers. The current treatment provides moderate improvement. There is no history of angina, kidney disease, CAD/MI, CVA, heart failure, left ventricular hypertrophy, PVD or retinopathy. Identifiable causes of hypertension include a thyroid problem.  Erectile Dysfunction This is a chronic problem. The current episode started more than 1 year ago. The problem has been gradually improving since onset. The nature of his difficulty is achieving erection. He reports no anxiety. Irritative symptoms do not include frequency, nocturia or urgency. Obstructive symptoms do not include a weak stream. Pertinent negatives include no chills, dysuria or hematuria. Past treatments include sildenafil. The treatment provided moderate relief.  Thyroid Problem Presents for follow-up visit. Patient reports no anxiety, cold intolerance, constipation, depressed mood, diaphoresis, diarrhea, fatigue,  heat intolerance, hoarse voice, leg swelling, palpitations, weight gain or weight loss. The symptoms have been improving. There is no history of heart failure.   Lab Results  Component Value Date   NA 135 01/29/2021   K 3.6 01/29/2021   CO2 26 01/29/2021   GLUCOSE 118 (H) 01/29/2021   BUN 17 01/29/2021   CREATININE 1.76 (H) 01/29/2021   CALCIUM 8.5 (L) 01/29/2021   EGFR 45 (L) 07/06/2020   GFRNONAA 41 (L) 01/29/2021   Lab Results  Component Value Date   CHOL 189 07/06/2020   HDL 55 07/06/2020   LDLCALC 115 (H) 07/06/2020   TRIG 108 07/06/2020   CHOLHDL 3.8 03/31/2017   Lab Results  Component Value Date   TSH 12.652 (H) 01/29/2021   No results found for: HGBA1C Lab Results  Component Value Date   WBC 5.0 01/29/2021   HGB 15.7 01/29/2021   HCT 45.7 01/29/2021   MCV 95.0 01/29/2021   PLT 207 01/29/2021   Lab Results  Component Value Date   ALT 17 01/29/2021   AST 21 01/29/2021   ALKPHOS 46 01/29/2021   BILITOT 0.6 01/29/2021   No results found for: 25OHVITD2, 25OHVITD3, VD25OH   Review of Systems  Constitutional:  Negative for chills, diaphoresis, fatigue, fever, weight gain and weight loss.  HENT:  Negative for drooling, ear discharge, ear pain, hoarse voice and sore throat.   Eyes:  Negative for blurred vision.  Respiratory:  Negative for cough, choking, shortness of breath and wheezing.   Cardiovascular:  Negative for chest pain, palpitations and leg swelling.  Gastrointestinal:  Negative for abdominal pain, blood in stool, constipation, diarrhea, dysphagia, heartburn, melena and nausea.  Endocrine: Negative for cold intolerance, heat intolerance and polydipsia.  Genitourinary:  Negative for dysuria, frequency, hematuria, nocturia and urgency.  Musculoskeletal:  Negative for back pain, myalgias, muscle weakness and neck pain.  Skin:  Negative for rash.  Allergic/Immunologic: Negative for environmental allergies.  Neurological:  Negative for dizziness and  headaches.  Hematological:  Does not bruise/bleed easily.  Psychiatric/Behavioral:  Negative for suicidal ideas. The patient is not nervous/anxious.    Patient Active Problem List   Diagnosis Date Noted   Hypokalemia 04/09/2019   Hypocalcemia 01/03/2019   Abnormal PET scan of head 10/26/2018   Low TSH level 09/16/2018   Renal insufficiency 07/09/2018   Atherosclerosis of aorta ([place]) 06/27/2018   Encounter for antineoplastic immunotherapy 05/18/2018   Erythrocytosis 04/27/2018   Oral mucositis due to radiation 06/22/2017   Goals of care, counseling/discussion 04/07/2017   Stage 3 chronic kidney disease ([place]) 04/03/2017   Hypothyroid 03/31/2017   ED (erectile dysfunction) 03/31/2017   Gastroesophageal reflux disease 09/23/2016   Essential hypertension 09/23/2016   Squamous cell carcinoma of base of tongue ([place]) 08/04/2016    Allergies  Allergen Reactions   Ace Inhibitors Cough    Past Surgical History:  Procedure Laterality Date   EXCISION OF TONGUE LESION Bilateral 02/27/2020   Procedure: EXCISION OF TONGUE AND MOUTH LESION;  Surgeon: Margaretha Sheffield, MD;  Location: [facility];  Service: ENT;  Laterality: Bilateral;   tumor removed     throat    Social History   Tobacco Use   Smoking status: Never   Smokeless tobacco: Never   Tobacco comments:    Smoking cessation materials not required  Vaping Use   Vaping Use: Never used  Substance Use Topics   Alcohol use: Yes    Alcohol/week: 2.0 standard drinks    Types: 2 Shots of liquor per week   Drug use: No     Medication list has been reviewed and updated.  Current Meds  Medication Sig   acetaminophen (TYLENOL) 650 MG CR tablet Take 1,300 mg by mouth every 8 (eight) hours.   amLODipine (NORVASC) 5 MG tablet Take 1 tablet (5 mg total) by mouth daily.   aspirin EC 81 MG tablet Take 81 mg by mouth daily.   esomeprazole (NEXIUM) 40 MG capsule Take 1 capsule (40 mg total) by mouth daily.   levothyroxine  (SYNTHROID) 112 MCG tablet Take 1 tablet (112 mcg total) by mouth daily.   sildenafil (VIAGRA) 100 MG tablet Take 0.5-1 tablets (50-100 mg total) by mouth daily as needed for erectile dysfunction.       07/06/2021    8:41 AM 07/06/2020   10:36 AM 01/07/2020    8:22 AM 09/03/2019    9:40 AM  GAD 7 : Generalized Anxiety Score  Nervous, Anxious, on Edge 0 0 0 0  Control/stop worrying 0 0 0 0  Worry too much - different things 1 0 0 0  Trouble relaxing 0 0 0 0  Restless 0 0 0 0  Easily annoyed or irritable 1 1 0 0  Afraid - awful might happen 0 0 0 0  Total GAD 7 Score 2 1 0 0  Anxiety Difficulty Not difficult at all Not difficult at all         07/06/2021    8:40 AM  Depression screen PHQ 2/9  Decreased Interest 2  Down, Depressed, Hopeless 0  PHQ - 2 Score 2  Altered sleeping 0  Tired, decreased energy 0  Change in appetite 3  Feeling bad or failure about yourself  0  Trouble concentrating 0  Moving slowly or fidgety/restless 0  Suicidal thoughts  0  PHQ-9 Score 5  Difficult doing work/chores Not difficult at all    BP Readings from Last 3 Encounters:  07/06/21 120/80  01/29/21 (!) 147/93  12/24/20 130/84    Physical Exam Vitals and nursing note reviewed.  HENT:     Head: Normocephalic.     Right Ear: External ear normal.     Left Ear: External ear normal.     Nose: Nose normal.  Eyes:     General: No scleral icterus.       Right eye: No discharge.        Left eye: No discharge.     Conjunctiva/sclera: Conjunctivae normal.     Pupils: Pupils are equal, round, and reactive to light.  Neck:     Thyroid: No thyromegaly.     Vascular: No JVD.     Trachea: No tracheal deviation.  Cardiovascular:     Rate and Rhythm: Normal rate and regular rhythm.     Heart sounds: Normal heart sounds. No murmur heard.   No friction rub. No gallop.  Pulmonary:     Effort: No respiratory distress.     Breath sounds: Normal breath sounds. No wheezing or rales.  Abdominal:      General: Bowel sounds are normal.     Palpations: Abdomen is soft. There is no mass.     Tenderness: There is no abdominal tenderness. There is no guarding or rebound.  Musculoskeletal:        General: No tenderness.     Cervical back: Normal range of motion and neck supple.  Lymphadenopathy:     Cervical: No cervical adenopathy.  Skin:    General: Skin is warm.  Neurological:     Mental Status: He is alert.     Cranial Nerves: No cranial nerve deficit.     Deep Tendon Reflexes: Reflexes are normal and symmetric.    Wt Readings from Last 3 Encounters:  07/06/21 177 lb (80.3 kg)  01/29/21 178 lb 14.4 oz (81.1 kg)  12/24/20 177 lb (80.3 kg)    BP 120/80   Pulse 80   Ht _0  (1.702 m)   Wt 177 lb (80.3 kg)   BMI 27.72 kg/m   Assessment and Plan:  1. Essential hypertension Chronic.  Controlled.  Stable.  Blood pressure is 120/80.  Continue amlodipine 5 mg once a day.  Will check renal function panel for GFR and electrolytes.  We will recheck patient in 6 months. - amLODipine (NORVASC) 5 MG tablet; Take 1 tablet (5 mg total) by mouth daily.  Dispense: 30 tablet; Refill: 5 - Renal Function Panel  2. Gastroesophageal reflux disease, unspecified whether esophagitis present Chronic.  Controlled.  Stable.  Continue Nexium 40 mg once a day. - esomeprazole (NEXIUM) 40 MG capsule; Take 1 capsule (40 mg total) by mouth daily.  Dispense: 30 capsule; Refill: 5  3. Postoperative hypothyroidism Chronic.  Controlled.  Stable.  Pending TSH and current status we will likely continue levothyroxine supplementation at 112 mcg daily. - levothyroxine (SYNTHROID) 112 MCG tablet; Take 1 tablet (112 mcg total) by mouth daily.  Dispense: 30 tablet; Refill: 0 - Thyroid Panel With TSH  4. Erectile dysfunction due to arterial insufficiency Chronic.  Controlled.  Stable.  Patient is doing well on sildenafil 100 mg 1/2 to 1 tablet a day - sildenafil (VIAGRA) 100 MG tablet; Take 0.5-1 tablets (50-100 mg  total) by mouth daily as needed for erectile dysfunction.  Dispense: 5 tablet; Refill: 11  5. Familial hypercholesterolemia Chronic.  Controlled.  Stable continue dietary control and we will check lipid panel. - Lipid Panel With LDL/HDL Ratio  6. Seasonal allergic rhinitis due to pollen Patient has some seasonal allergies and we will give him some loratadine prior to going [place].

## 2021-07-07 ENCOUNTER — Encounter: Payer: Self-pay | Admitting: Oncology

## 2021-07-07 ENCOUNTER — Other Ambulatory Visit: Payer: Self-pay

## 2021-07-07 DIAGNOSIS — E890 Postprocedural hypothyroidism: Secondary | ICD-10-CM

## 2021-07-07 LAB — RENAL FUNCTION PANEL
Albumin: 3.9 g/dL (ref 3.8–4.8)
BUN/Creatinine Ratio: 7 — ABNORMAL LOW (ref 10–24)
BUN: 12 mg/dL (ref 8–27)
CO2: 28 mmol/L (ref 20–29)
Calcium: 8.9 mg/dL (ref 8.6–10.2)
Chloride: 101 mmol/L (ref 96–106)
Creatinine, Ser: 1.73 mg/dL — ABNORMAL HIGH (ref 0.76–1.27)
Glucose: 116 mg/dL — ABNORMAL HIGH (ref 70–99)
Phosphorus: 3.1 mg/dL (ref 2.8–4.1)
Potassium: 4.6 mmol/L (ref 3.5–5.2)
Sodium: 141 mmol/L (ref 134–144)
eGFR: 42 mL/min/{1.73_m2} — ABNORMAL LOW (ref >59)

## 2021-07-07 LAB — LIPID PANEL WITH LDL/HDL RATIO
Cholesterol, Total: 207 mg/dL — ABNORMAL HIGH (ref 100–199)
HDL: 51 mg/dL (ref >39)
LDL Chol Calc (NIH): 135 mg/dL — ABNORMAL HIGH (ref 0–99)
LDL/HDL Ratio: 2.6 ratio (ref 0.0–3.6)
Triglycerides: 120 mg/dL (ref 0–149)
VLDL Cholesterol Cal: 21 mg/dL (ref 5–40)

## 2021-07-07 LAB — THYROID PANEL WITH TSH
Free Thyroxine Index: 2.7 (ref 1.2–4.9)
T3 Uptake Ratio: 32 % (ref 24–39)
T4, Total: 8.3 ug/dL (ref 4.5–12.0)
TSH: 6.29 u[IU]/mL — ABNORMAL HIGH (ref 0.450–4.500)

## 2021-07-07 MED ORDER — LEVOTHYROXINE SODIUM 125 MCG PO TABS: 125.0000 ug | ORAL_TABLET | Freq: Every day | ORAL | 0 refills | Status: DC

## 2021-07-07 NOTE — Progress Notes (Signed)
Sent in levo 151mg

## 2021-09-11 ENCOUNTER — Other Ambulatory Visit: Payer: Self-pay | Admitting: Family Medicine

## 2021-09-15 ENCOUNTER — Other Ambulatory Visit: Payer: Self-pay | Admitting: Family Medicine

## 2021-09-15 DIAGNOSIS — E890 Postprocedural hypothyroidism: Secondary | ICD-10-CM

## 2021-11-15 ENCOUNTER — Other Ambulatory Visit: Payer: Self-pay | Admitting: Family Medicine

## 2021-11-15 DIAGNOSIS — E890 Postprocedural hypothyroidism: Secondary | ICD-10-CM

## 2021-11-16 NOTE — Telephone Encounter (Signed)
Requested medications are due for refill today.  yes  Requested medications are on the active medications list.  yes  Last refill. 09/15/2021 #30 0 rf  Future visit scheduled.   yes  Notes to clinic.  Refill failed protocol d/t abnormal labs.    Requested Prescriptions  Pending Prescriptions Disp Refills   levothyroxine (SYNTHROID) 125 MCG tablet [Pharmacy Med Name: LEVOTHYROXINE 125 MCG TABLET] 30 tablet 0    Sig: Take 1 tablet (125 mcg total) by mouth daily.     Endocrinology:  Hypothyroid Agents Failed - 11/15/2021 12:45 PM      Failed - TSH in normal range and within 360 days    TSH  Date Value Ref Range Status  07/06/2021 6.290 (H) 0.450 - 4.500 uIU/mL Final         Passed - Valid encounter within last 12 months    Recent Outpatient Visits           4 months ago Essential hypertension   [facility] [place], Deanna C, MD   10 months ago Essential hypertension   [facility] [place], Deanna C, MD   1 year ago Essential hypertension   [facility] [place], Deanna C, MD   1 year ago Essential hypertension   [facility] [place], Deanna C, MD   2 years ago Essential hypertension   [facility] [place], [place], MD       Future Appointments             In 1 month Juline Patch, MD [facility], [facility]

## 2021-12-17 ENCOUNTER — Other Ambulatory Visit: Payer: Self-pay | Admitting: Family Medicine

## 2021-12-17 DIAGNOSIS — E890 Postprocedural hypothyroidism: Secondary | ICD-10-CM

## 2021-12-27 ENCOUNTER — Ambulatory Visit: Payer: Medicare HMO

## 2021-12-30 ENCOUNTER — Ambulatory Visit (INDEPENDENT_AMBULATORY_CARE_PROVIDER_SITE_OTHER): Payer: Medicare HMO | Admitting: Family Medicine

## 2021-12-30 ENCOUNTER — Encounter: Payer: Self-pay | Admitting: Family Medicine

## 2021-12-30 VITALS — BP 136/78 | HR 86 | Ht 67.0 in | Wt 175.0 lb

## 2021-12-30 DIAGNOSIS — E890 Postprocedural hypothyroidism: Secondary | ICD-10-CM | POA: Diagnosis not present

## 2021-12-30 DIAGNOSIS — Z23 Encounter for immunization: Secondary | ICD-10-CM | POA: Diagnosis not present

## 2021-12-30 DIAGNOSIS — K219 Gastro-esophageal reflux disease without esophagitis: Secondary | ICD-10-CM | POA: Diagnosis not present

## 2021-12-30 DIAGNOSIS — I10 Essential (primary) hypertension: Secondary | ICD-10-CM

## 2021-12-30 DIAGNOSIS — E7801 Familial hypercholesterolemia: Secondary | ICD-10-CM

## 2021-12-30 MED ORDER — ESOMEPRAZOLE MAGNESIUM 40 MG PO CPDR: 40.0000 mg | DELAYED_RELEASE_CAPSULE | Freq: Every day | ORAL | 5 refills | Status: DC

## 2021-12-30 MED ORDER — LEVOTHYROXINE SODIUM 125 MCG PO TABS: 125.0000 ug | ORAL_TABLET | Freq: Every day | ORAL | 5 refills | Status: DC

## 2021-12-30 MED ORDER — AMLODIPINE BESYLATE 10 MG PO TABS: 10.0000 mg | ORAL_TABLET | Freq: Every day | ORAL | 0 refills | Status: DC

## 2021-12-30 NOTE — Progress Notes (Signed)
Date:  12/30/2021   Name:  Troy [name]   DOB:  1950/10/24   MRN:  [id number]   Chief Complaint: Hypothyroidism, Hypertension, Gastroesophageal Reflux, and Flu Vaccine  Hypertension This is a chronic problem. The current episode started more than 1 year ago. The problem has been gradually improving since onset. The problem is uncontrolled. Pertinent negatives include no blurred vision, chest pain, headaches, neck pain, orthopnea, palpitations or shortness of breath. There are no associated agents to hypertension. Past treatments include calcium channel blockers. The current treatment provides mild improvement. Identifiable causes of hypertension include a thyroid problem.  Gastroesophageal Reflux He reports no abdominal pain, no chest pain, no coughing, no dysphagia, no heartburn, no nausea, no sore throat or no wheezing. This is a chronic problem. The problem occurs rarely. The problem has been gradually improving. Pertinent negatives include no fatigue or weight loss.  Erectile Dysfunction This is a chronic problem. The current episode started more than 1 year ago. The nature of his difficulty is achieving erection. He reports no anxiety. Irritative symptoms do not include frequency or urgency. Pertinent negatives include no chills, dysuria, genital pain, hematuria, hesitancy or inability to urinate.  Thyroid Problem Presents for follow-up visit. Patient reports no anxiety, cold intolerance, constipation, depressed mood, diarrhea, dry skin, fatigue, hair loss, heat intolerance, nail problem, palpitations, weight gain or weight loss.    Lab Results  Component Value Date   NA 141 07/06/2021   K 4.6 07/06/2021   CO2 28 07/06/2021   GLUCOSE 116 (H) 07/06/2021   BUN 12 07/06/2021   CREATININE 1.73 (H) 07/06/2021   CALCIUM 8.9 07/06/2021   EGFR 42 (L) 07/06/2021   GFRNONAA 41 (L) 01/29/2021   Lab Results  Component Value Date   CHOL 207 (H) 07/06/2021   HDL 51 07/06/2021   LDLCALC  135 (H) 07/06/2021   TRIG 120 07/06/2021   CHOLHDL 3.8 03/31/2017   Lab Results  Component Value Date   TSH 6.290 (H) 07/06/2021   No results found for: "HGBA1C" Lab Results  Component Value Date   WBC 5.0 01/29/2021   HGB 15.7 01/29/2021   HCT 45.7 01/29/2021   MCV 95.0 01/29/2021   PLT 207 01/29/2021   Lab Results  Component Value Date   ALT 17 01/29/2021   AST 21 01/29/2021   ALKPHOS 46 01/29/2021   BILITOT 0.6 01/29/2021   No results found for: "25OHVITD2", "25OHVITD3", "VD25OH"   Review of Systems  Constitutional:  Negative for chills, fatigue, fever, weight gain and weight loss.  HENT:  Negative for drooling, ear discharge, ear pain and sore throat.   Eyes:  Negative for blurred vision.  Respiratory:  Negative for cough, shortness of breath and wheezing.   Cardiovascular:  Negative for chest pain, palpitations, orthopnea and leg swelling.  Gastrointestinal:  Negative for abdominal pain, blood in stool, constipation, diarrhea, dysphagia, heartburn and nausea.  Endocrine: Negative for cold intolerance, heat intolerance and polydipsia.  Genitourinary:  Negative for dysuria, frequency, hematuria, hesitancy and urgency.  Musculoskeletal:  Negative for back pain, myalgias and neck pain.  Skin:  Negative for rash.  Allergic/Immunologic: Negative for environmental allergies.  Neurological:  Negative for dizziness and headaches.  Hematological:  Does not bruise/bleed easily.  Psychiatric/Behavioral:  Negative for suicidal ideas. The patient is not nervous/anxious.     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 06/22/2017   Goals of care,  counseling/discussion 04/07/2017   Stage 3 chronic kidney disease ([place]) 04/03/2017   Hypothyroid 03/31/2017   ED (erectile dysfunction) 03/31/2017   Gastroesophageal reflux disease 09/23/2016   Essential hypertension 09/23/2016   Squamous cell carcinoma of base of tongue ([facility]) 08/04/2016    Allergies  Allergen Reactions   Ace Inhibitors Cough    Past Surgical History:  Procedure Laterality Date   EXCISION OF TONGUE LESION Bilateral 02/27/2020   Procedure: EXCISION OF TONGUE AND MOUTH LESION;  Surgeon: Margaretha Sheffield, MD;  Location: [facility];  Service: ENT;  Laterality: Bilateral;   tumor removed     throat    Social History   Tobacco Use   Smoking status: Never   Smokeless tobacco: Never   Tobacco comments:    Smoking cessation materials not required  Vaping Use   Vaping Use: Never used  Substance Use Topics   Alcohol use: Yes    Alcohol/week: 2.0 standard drinks of alcohol    Types: 2 Shots of liquor per week   Drug use: No     Medication list has been reviewed and updated.  Current Meds  Medication Sig   acetaminophen (TYLENOL) 650 MG CR tablet Take 1,300 mg by mouth every 8 (eight) hours.   ALLERGY RELIEF 10 MG tablet Take 1 tablet (10 mg total) by mouth daily.   aspirin EC 81 MG tablet Take 81 mg by mouth daily.   esomeprazole (NEXIUM) 40 MG capsule Take 1 capsule (40 mg total) by mouth daily.   levothyroxine (SYNTHROID) 125 MCG tablet Take 1 tablet (125 mcg total) by mouth daily.   sildenafil (VIAGRA) 100 MG tablet Take 0.5-1 tablets (50-100 mg total) by mouth daily as needed for erectile dysfunction.   [DISCONTINUED] amLODipine (NORVASC) 5 MG tablet Take 1 tablet (5 mg total) by mouth daily.       07/06/2021    8:41 AM 07/06/2020   10:36 AM 01/07/2020    8:22 AM 09/03/2019    9:40 AM  GAD 7 : Generalized Anxiety Score  Nervous, Anxious, on Edge 0 0 0 0  Control/stop worrying 0 0 0 0  Worry too much - different things 1 0 0 0  Trouble relaxing  0 0 0 0  Restless 0 0 0 0  Easily annoyed or irritable 1 1 0 0  Afraid - awful might happen 0 0 0 0  Total GAD 7 Score 2 1 0 0  Anxiety Difficulty Not difficult at all Not difficult at all         07/06/2021    8:40 AM 12/23/2020   10:15 AM 07/06/2020   10:36 AM  Depression screen PHQ 2/9  Decreased Interest 2 0 0  Down, Depressed, Hopeless 0 0 0  PHQ - 2 Score 2 0 0  Altered sleeping 0  0  Tired, decreased energy 0  0  Change in appetite 3  0  Feeling bad or failure about yourself  0  0  Trouble concentrating 0  0  Moving slowly or fidgety/restless 0  0  Suicidal thoughts 0  0  PHQ-9 Score 5  0  Difficult doing work/chores Not difficult at all      BP Readings from Last 3 Encounters:  12/30/21 136/78  07/06/21 120/80  01/29/21 (!) 147/93    Physical Exam Vitals and nursing note reviewed.  HENT:     Head: Normocephalic.     Right Ear: External ear normal.     Left Ear: External ear normal.     Nose: Nose normal.  Eyes:     General: No scleral icterus.       Right eye: No discharge.        Left eye: No discharge.     Conjunctiva/sclera: Conjunctivae normal.     Pupils: Pupils are equal, round, and reactive to light.  Neck:     Thyroid: No thyromegaly.     Vascular: No JVD.     Trachea: No tracheal deviation.  Cardiovascular:     Rate and Rhythm: Normal rate and regular rhythm.     Heart sounds: Normal heart sounds. No murmur heard.    No friction rub. No gallop.  Pulmonary:     Effort: No respiratory distress.     Breath sounds: Normal breath sounds. No wheezing or rales.  Abdominal:     General: Bowel sounds are normal.     Palpations: Abdomen is soft. There is no mass.     Tenderness: There is no abdominal tenderness. There is no guarding or rebound.  Musculoskeletal:        General: No tenderness. Normal range of motion.     Cervical back: Normal range of motion and neck supple.  Lymphadenopathy:     Cervical: No cervical adenopathy.  Skin:     General: Skin is warm.     Findings: No rash.  Neurological:     Mental Status: He is alert and oriented to person, place, and time.     Cranial Nerves: No cranial nerve deficit.     Wt Readings from Last 3 Encounters:  12/30/21 175 lb (79.4 kg)  07/06/21 177 lb (80.3 kg)  01/29/21 178 lb 14.4 oz (81.1 kg)    BP 136/78 (BP Location: Right Arm, Cuff Size: Large)   Pulse 86   Ht 5' 7" (1.702 m)   Wt 175 lb (79.4 kg)   SpO2 98%   BMI 27.41 kg/m   Assessment and Plan:  1. Essential hypertension Chronic.  Uncontrolled.  Stable.  Blood pressure remains elevated at 136/78.  We will increase amlodipine to 10 mg once a day and will recheck in 6 weeks. - Renal Function Panel - amLODipine (NORVASC) 10 MG tablet; Take 1 tablet (10 mg total) by mouth daily.  Dispense: 60 tablet; Refill: 0  2. Gastroesophageal reflux disease, unspecified whether esophagitis present Chronic.  Controlled.  Stable.  Continue Nexium 40 mg once a day. - esomeprazole (NEXIUM) 40 MG capsule; Take 1 capsule (40 mg total) by mouth daily.  Dispense: 30 capsule; Refill: 5  3. Postoperative hypothyroidism Chronic.  Controlled.  Stable.  Continue Synthroid 125 mcg daily.  We will check TSH for current level of control. - levothyroxine (SYNTHROID) 125 MCG tablet; Take 1 tablet (125 mcg total) by mouth daily.  Dispense: 30 tablet; Refill: 5 - Thyroid Panel With TSH  4. Need for immunization against influenza Discussed and administered. - Flu Vaccine QUAD High Dose(Fluad)  5. Familial hypercholesterolemia Elevated triglycerides on dietary approach.  We will check LDL for current level of surveillance. - Direct LDL     Otilio Miu, MD

## 2021-12-31 ENCOUNTER — Other Ambulatory Visit: Payer: Self-pay

## 2021-12-31 ENCOUNTER — Ambulatory Visit (INDEPENDENT_AMBULATORY_CARE_PROVIDER_SITE_OTHER): Payer: Medicare HMO

## 2021-12-31 DIAGNOSIS — Z0000 Encounter for general adult medical examination without abnormal findings: Secondary | ICD-10-CM | POA: Diagnosis not present

## 2021-12-31 DIAGNOSIS — E782 Mixed hyperlipidemia: Secondary | ICD-10-CM

## 2021-12-31 LAB — RENAL FUNCTION PANEL
Albumin: 4.2 g/dL (ref 3.8–4.8)
BUN/Creatinine Ratio: 9 — ABNORMAL LOW (ref 10–24)
BUN: 14 mg/dL (ref 8–27)
CO2: 25 mmol/L (ref 20–29)
Calcium: 9.4 mg/dL (ref 8.6–10.2)
Chloride: 101 mmol/L (ref 96–106)
Creatinine, Ser: 1.51 mg/dL — ABNORMAL HIGH (ref 0.76–1.27)
Glucose: 111 mg/dL — ABNORMAL HIGH (ref 70–99)
Phosphorus: 2.8 mg/dL (ref 2.8–4.1)
Potassium: 4.4 mmol/L (ref 3.5–5.2)
Sodium: 141 mmol/L (ref 134–144)
eGFR: 49 mL/min/{1.73_m2} — ABNORMAL LOW (ref >59)

## 2021-12-31 LAB — THYROID PANEL WITH TSH
Free Thyroxine Index: 3 (ref 1.2–4.9)
T3 Uptake Ratio: 33 % (ref 24–39)
T4, Total: 9.1 ug/dL (ref 4.5–12.0)
TSH: 3.51 u[IU]/mL (ref 0.450–4.500)

## 2021-12-31 LAB — LDL CHOLESTEROL, DIRECT: LDL Direct: 153 mg/dL — ABNORMAL HIGH (ref 0–99)

## 2021-12-31 MED ORDER — ATORVASTATIN CALCIUM 10 MG PO TABS: 10.0000 mg | ORAL_TABLET | Freq: Every day | ORAL | 1 refills | Status: DC

## 2021-12-31 NOTE — Progress Notes (Signed)
I connected with  Troy [name] on 12/31/21 by a audio enabled telemedicine application and verified that I am speaking with the correct person using two identifiers.  Patient Location: Home  Provider Location: Office/Clinic  I discussed the limitations of evaluation and management by telemedicine. The patient expressed understanding and agreed to proceed.  Subjective:   Troy [name] is a 71 y.o. male who presents for Medicare Annual/Subsequent preventive examination.  Review of Systems    Per HPI unless specifically indicated below.  Cardiac Risk Factors include: advanced age (>4mn, >>36women);male gender, essential hypertension, and hyperlipidemia.           Objective:        12/30/2021    8:45 AM 12/30/2021    8:41 AM 07/06/2021    8:40 AM  Vitals with BMI  Height  '5\' 7"'$  '5\' 7"'$   Weight  175 lbs 177 lbs  BMI  299.8233.82 Systolic [id number] Diastolic 78 80 80  Pulse  86 80    There were no vitals filed for this visit. There is no height or weight on file to calculate BMI.     01/29/2021   10:30 AM 12/23/2020   10:15 AM 07/28/2020   11:10 AM 02/27/2020    7:57 AM 01/13/2020   11:54 AM 12/23/2019    9:28 AM 07/08/2019   10:53 AM  Advanced Directives  Does Patient Have a Medical Advance Directive? No No No No Yes No No  Type of APersonnel officerLiving will    Does patient want to make changes to medical advance directive?     No - Patient declined    Copy of [place]?     No - copy requested    Would patient like information on creating a medical advance directive? No - Patient declined Yes (MAU/Ambulatory/Procedural Areas - Information given) No - Patient declined No - Patient declined No - Patient declined No - Patient declined No - Patient declined    Current Medications (verified) Outpatient Encounter Medications as of 12/31/2021  Medication Sig   acetaminophen (TYLENOL) 650 MG CR tablet  Take 1,300 mg by mouth every 8 (eight) hours.   ALLERGY RELIEF 10 MG tablet Take 1 tablet (10 mg total) by mouth daily.   amLODipine (NORVASC) 10 MG tablet Take 1 tablet (10 mg total) by mouth daily.   aspirin EC 81 MG tablet Take 81 mg by mouth daily.   esomeprazole (NEXIUM) 40 MG capsule Take 1 capsule (40 mg total) by mouth daily.   levothyroxine (SYNTHROID) 125 MCG tablet Take 1 tablet (125 mcg total) by mouth daily.   sildenafil (VIAGRA) 100 MG tablet Take 0.5-1 tablets (50-100 mg total) by mouth daily as needed for erectile dysfunction.   No facility-administered encounter medications on file as of 12/31/2021.    Allergies (verified) Ace inhibitors   History: Past Medical History:  Diagnosis Date   [place]    GERD (gastroesophageal reflux disease)    Hypertension    Thyroid disease    Past Surgical History:  Procedure Laterality Date   EXCISION OF TONGUE LESION Bilateral 02/27/2020   Procedure: EXCISION OF TONGUE AND MOUTH LESION;  Surgeon: JMargaretha Sheffield MD;  [facility]  Service: ENT;  Laterality: Bilateral;   tumor removed     throat   Family History  Problem Relation Age of Onset   Cancer Mother  Cancer Sister    Cancer Maternal Aunt    Cancer Paternal Uncle    Social History   Socioeconomic History   Marital status: Single    Spouse name: Not on file   Number of children: 2   Years of education: Not on file   Highest education level: Not on file  Occupational History   Occupation: Retired  Tobacco Use   Smoking status: Never   Smokeless tobacco: Never   Tobacco comments:    Smoking cessation materials not required  Vaping Use   Vaping Use: Never used  Substance and Sexual Activity   Alcohol use: Yes    Alcohol/week: 2.0 standard drinks of alcohol    Types: 2 Shots of liquor per week   Drug use: No   Sexual activity: Yes  Other Topics Concern   Not on file  Social History Narrative   Pt lives with his sister   Social  Determinants of Health   Financial Resource Strain: Low Risk  (12/31/2021)   Overall Financial Resource Strain (CARDIA)    Difficulty of Paying Living Expenses: Not hard at all  Food Insecurity: No Food Insecurity (12/31/2021)   Hunger Vital Sign    Worried About Running Out of Food in the Last Year: Never true    Ran Out of Food in the Last Year: Never true  Transportation Needs: No Transportation Needs (12/31/2021)   PRAPARE - Hydrologist (Medical): No    Lack of Transportation (Non-Medical): No  Physical Activity: Insufficiently Active (12/31/2021)   Exercise Vital Sign    Days of Exercise per Week: 3 days    Minutes of Exercise per Session: 10 min  Stress: No Stress Concern Present (12/31/2021)   [place]    Feeling of Stress : Not at all  Social Connections: Moderately Isolated (12/31/2021)   Social Connection and Isolation Panel [NHANES]    Frequency of Communication with Friends and Family: More than three times a week    Frequency of Social Gatherings with Friends and Family: More than three times a week    Attends Religious Services: 1 to 4 times per year    Active Member of [employer] or Organizations: No    Attends Music therapist: Never    Marital Status: Divorced    Tobacco Counseling Counseling given: No Tobacco comments: Smoking cessation materials not required   Clinical Intake:  Pre-visit preparation completed: Yes  Pain : No/denies pain     Nutritional Status: BMI 25 -29 Overweight Nutritional Risks: None Diabetes: No  How often do you need to have someone help you when you read instructions, pamphlets, or other written materials from your doctor or pharmacy?: 1 - Never  Diabetic?No  Interpreter Needed?: No  Information entered by :: Donnie Mesa, CMA   Activities of Daily Living    12/31/2021    9:47 AM  In your present state of  health, do you have any difficulty performing the following activities:  Hearing? 0  Vision? 1  Difficulty concentrating or making decisions? 0  Walking or climbing stairs? 1  Dressing or bathing? 0  Doing errands, shopping? 0    Patient Care Team: Juline Patch, MD as PCP - General (Family Medicine) Anthonette Legato, MD (Internal Medicine) Lequita Asal, MD (Inactive) as Referring Physician (Hematology and Oncology)  Indicate any recent Medical Services you may have received from other than [facility] providers in the  past year (date may be approximate).    No Hospitalization in the past 12 months. Assessment:   This is a routine wellness examination for Troy [name].  Hearing/Vision screen Denies any difficulty with hearing. Pt notice a change in his vision overtime, due for Eye Exam, no appt scheduled. Pt recommended that he need Annual Eye Exams.   Dietary issues and exercise activities discussed: Current Exercise Habits: Structured exercise class, Time (Minutes): 10, Frequency (Times/Week): 3, Weekly Exercise (Minutes/Week): 30, Intensity: Mild No hospitalization in the past 12 months.  Goals Addressed   None    Depression Screen    12/31/2021    9:46 AM 07/06/2021    8:40 AM 12/23/2020   10:15 AM 07/06/2020   10:36 AM 01/07/2020    8:22 AM 12/23/2019    9:27 AM 09/03/2019    9:40 AM  PHQ 2/9 Scores  PHQ - 2 Score 0 2 0 0 0 0 0  PHQ- 9 Score  5  0 0  0    Fall Risk    12/31/2021    9:47 AM 07/06/2021    8:41 AM 12/23/2020   10:16 AM 01/07/2020    8:22 AM 12/23/2019    9:29 AM  Fall Risk   Falls in the past year? 0 0 0 0 0  Number falls in past yr: 0 0 0  0  Injury with Fall? 0 0 0  0  Risk for fall due to : No Fall Risks No Fall Risks No Fall Risks  No Fall Risks  Follow up Falls evaluation completed Falls evaluation completed Falls prevention discussed Falls evaluation completed Falls prevention discussed    FALL RISK PREVENTION PERTAINING TO THE HOME:  Any  stairs in or around the home? Yes  If so, are there any without handrails? No  Home free of loose throw rugs in walkways, pet beds, electrical cords, etc? Yes  Adequate lighting in your home to reduce risk of falls? Yes   ASSISTIVE DEVICES UTILIZED TO PREVENT FALLS:  Life alert? No  Use of a cane, walker or w/c? No  Grab bars in the bathroom? No  Shower chair or bench in shower? No  Elevated toilet seat or a handicapped toilet? Yes   TIMED UP AND GO:  Was the test performed? No . Virtual telephone visit  Cognitive Function:        12/31/2021    9:49 AM 12/23/2020   10:18 AM 12/23/2019    9:30 AM  6CIT Screen  What Year? 0 points 0 points 0 points  What month? 0 points 0 points 0 points  What time? 0 points 0 points 0 points  Count back from 20 0 points 0 points 0 points  Months in reverse 4 points 4 points 4 points  Repeat phrase 8 points 10 points 4 points  Total Score 12 points 14 points 8 points    Immunizations Immunization History  Administered Date(s) Administered   Fluad Quad(high Dose 65+) 03/05/2019, 12/23/2019, 12/24/2020, 12/30/2021   Moderna Sars-Covid-2 Vaccination 04/17/2019, 05/15/2019   Pneumococcal Conjugate-13 03/31/2017   Tdap 03/31/2017    TDAP status: Up to date  Flu Vaccine status: Up to date  Pneumococcal vaccine status: Due, Education has been provided regarding the importance of this vaccine. Advised may receive this vaccine at local pharmacy or Health Dept. Aware to provide a copy of the vaccination record if obtained from local pharmacy or Health Dept. Verbalized acceptance and understanding.  Covid-19 vaccine status: Information provided on  how to obtain vaccines.   Qualifies for Shingles Vaccine? Yes   Zostavax completed No   Shingrix Completed?: No.    Education has been provided regarding the importance of this vaccine. Patient has been advised to call insurance company to determine out of pocket expense if they have not yet received  this vaccine. Advised may also receive vaccine at local pharmacy or Health Dept. Verbalized acceptance and understanding.  Screening Tests Health Maintenance  Topic Date Due   COVID-19 Vaccine (3 - Moderna risk series) 01/15/2022 (Originally 06/12/2019)   Zoster Vaccines- Shingrix (1 of 2) 04/01/2022 (Originally 11/23/1969)   Pneumonia Vaccine 44+ Years old (2 - PPSV23 or PCV20) 07/07/2022 (Originally 03/31/2018)   Hepatitis C Screening  07/07/2022 (Originally 11/23/1968)   COLONOSCOPY (Pts 45-35yr Insurance coverage will need to be confirmed)  02/14/2022   Medicare Annual Wellness (AWV)  01/01/2023   INFLUENZA VACCINE  Completed   HPV VACCINES  Aged Out    Health Maintenance  There are no preventive care reminders to display for this patient.  Colorectal cancer screening: Type of screening: Colonoscopy. Completed 1/1/20214. Repeat every 10 years  Lung Cancer Screening: (Low Dose CT Chest recommended if Age 71-80years, 30 pack-year currently smoking OR have quit w/in 15years.) does not qualify.   Additional Screening:  Hepatitis C Screening: does qualify; overdue, not up-to-date   Vision Screening: Recommended annual ophthalmology exams for early detection of glaucoma and other disorders of the eye. Is the patient up to date with their annual eye exam?  No  Who is the provider or what is the name of the office in which the patient attends annual eye exams?  If pt is not established with a provider, would they like to be referred to a provider to establish care? No .   Dental Screening: Recommended annual dental exams for proper oral hygiene  Community Resource Referral / Chronic Care Management: CRR required this visit?  No   CCM required this visit?  No      Plan:     I have personally reviewed and noted the following in the patient's chart:   Medical and social history Use of alcohol, tobacco or illicit drugs  Current medications and supplements including opioid  prescriptions. Patient is not currently taking opioid prescriptions. Functional ability and status Nutritional status Physical activity Advanced directives List of other physicians Hospitalizations, surgeries, and ER visits in previous 12 months Vitals Screenings to include cognitive, depression, and falls Referrals and appointments  In addition, I have reviewed and discussed with patient certain preventive protocols, quality metrics, and best practice recommendations. A written personalized care plan for preventive services as well as general preventive health recommendations were provided to patient.    Troy [name], Thank you for taking time to come for your Medicare Wellness Visit. I appreciate your ongoing commitment to your health goals. Please review the following plan we discussed and let me know if I can assist you in the future.   These are the goals we discussed:  Goals      Increase physical activity     Recommend increasing physical activity to at least 3 times per week        This is a list of the screening recommended for you and due dates:  Health Maintenance  Topic Date Due   COVID-19 Vaccine (3 - Moderna risk series) 01/15/2022*   Zoster (Shingles) Vaccine (1 of 2) 04/01/2022*   Pneumonia Vaccine (2 - PPSV23 or PCV20) 07/07/2022*  Hepatitis C Screening: USPSTF Recommendation to screen - Ages 18-79 yo.  07/07/2022*   Colon Cancer Screening  02/14/2022   Medicare Annual Wellness Visit  01/01/2023   Flu Shot  Completed   HPV Vaccine  Aged Out  *Topic was postponed. The date shown is not the original due date.     Wilson Singer, [place]   12/31/2021   Nurse Notes: Approximately 30 minute Non-Face -To-Face Medicare Wellness Visit

## 2022-01-31 ENCOUNTER — Inpatient Hospital Stay: Payer: Medicare HMO

## 2022-01-31 ENCOUNTER — Inpatient Hospital Stay: Payer: Medicare HMO | Admitting: Nurse Practitioner

## 2022-02-10 ENCOUNTER — Ambulatory Visit: Payer: Medicare HMO | Admitting: Family Medicine

## 2022-02-15 ENCOUNTER — Other Ambulatory Visit: Payer: Self-pay | Admitting: Family Medicine

## 2022-02-15 DIAGNOSIS — I10 Essential (primary) hypertension: Secondary | ICD-10-CM

## 2022-02-15 DIAGNOSIS — E7801 Familial hypercholesterolemia: Secondary | ICD-10-CM

## 2022-02-17 ENCOUNTER — Inpatient Hospital Stay: Payer: Medicare HMO

## 2022-02-17 ENCOUNTER — Inpatient Hospital Stay (HOSPITAL_BASED_OUTPATIENT_CLINIC_OR_DEPARTMENT_OTHER): Payer: Medicare HMO | Admitting: Nurse Practitioner

## 2022-02-17 ENCOUNTER — Other Ambulatory Visit: Payer: Medicare HMO

## 2022-02-17 DIAGNOSIS — Z08 Encounter for follow-up examination after completed treatment for malignant neoplasm: Secondary | ICD-10-CM

## 2022-02-17 DIAGNOSIS — Z8589 Personal history of malignant neoplasm of other organs and systems: Secondary | ICD-10-CM

## 2022-02-17 NOTE — Progress Notes (Signed)
Encounter entered in error. Disregard.

## 2022-02-21 ENCOUNTER — Encounter: Payer: Self-pay | Admitting: Nurse Practitioner

## 2022-02-21 ENCOUNTER — Inpatient Hospital Stay: Payer: Medicare HMO

## 2022-02-21 ENCOUNTER — Inpatient Hospital Stay: Payer: Medicare HMO | Attending: Nurse Practitioner | Admitting: Nurse Practitioner

## 2022-02-21 ENCOUNTER — Other Ambulatory Visit: Payer: Self-pay

## 2022-02-21 VITALS — BP 134/82 | HR 92 | Wt 177.0 lb

## 2022-02-21 DIAGNOSIS — Z08 Encounter for follow-up examination after completed treatment for malignant neoplasm: Secondary | ICD-10-CM

## 2022-02-21 DIAGNOSIS — E039 Hypothyroidism, unspecified: Secondary | ICD-10-CM | POA: Insufficient documentation

## 2022-02-21 DIAGNOSIS — I129 Hypertensive chronic kidney disease with stage 1 through stage 4 chronic kidney disease, or unspecified chronic kidney disease: Secondary | ICD-10-CM | POA: Insufficient documentation

## 2022-02-21 DIAGNOSIS — C01 Malignant neoplasm of base of tongue: Secondary | ICD-10-CM

## 2022-02-21 DIAGNOSIS — Z8589 Personal history of malignant neoplasm of other organs and systems: Secondary | ICD-10-CM

## 2022-02-21 DIAGNOSIS — N1832 Chronic kidney disease, stage 3b: Secondary | ICD-10-CM | POA: Insufficient documentation

## 2022-02-21 DIAGNOSIS — D751 Secondary polycythemia: Secondary | ICD-10-CM | POA: Insufficient documentation

## 2022-02-21 DIAGNOSIS — R7989 Other specified abnormal findings of blood chemistry: Secondary | ICD-10-CM

## 2022-02-21 LAB — COMPREHENSIVE METABOLIC PANEL
ALT: 14 U/L (ref 0–44)
AST: 20 U/L (ref 15–41)
Albumin: 4 g/dL (ref 3.5–5.0)
Alkaline Phosphatase: 49 U/L (ref 38–126)
Anion gap: 8 (ref 5–15)
BUN: 15 mg/dL (ref 8–23)
CO2: 26 mmol/L (ref 22–32)
Calcium: 8.9 mg/dL (ref 8.9–10.3)
Chloride: 104 mmol/L (ref 98–111)
Creatinine, Ser: 1.49 mg/dL — ABNORMAL HIGH (ref 0.61–1.24)
GFR, Estimated: 50 mL/min — ABNORMAL LOW (ref >60)
Glucose, Bld: 112 mg/dL — ABNORMAL HIGH (ref 70–99)
Potassium: 3.7 mmol/L (ref 3.5–5.1)
Sodium: 138 mmol/L (ref 135–145)
Total Bilirubin: 0.9 mg/dL (ref 0.3–1.2)
Total Protein: 7.3 g/dL (ref 6.5–8.1)

## 2022-02-21 LAB — CBC WITH DIFFERENTIAL/PLATELET
Abs Immature Granulocytes: 0.03 10*3/uL (ref 0.00–0.07)
Basophils Absolute: 0 10*3/uL (ref 0.0–0.1)
Basophils Relative: 1 %
Eosinophils Absolute: 0.1 10*3/uL (ref 0.0–0.5)
Eosinophils Relative: 3 %
HCT: 46.8 % (ref 39.0–52.0)
Hemoglobin: 16 g/dL (ref 13.0–17.0)
Immature Granulocytes: 1 %
Lymphocytes Relative: 33 %
Lymphs Abs: 1.6 10*3/uL (ref 0.7–4.0)
MCH: 31.4 pg (ref 26.0–34.0)
MCHC: 34.2 g/dL (ref 30.0–36.0)
MCV: 91.8 fL (ref 80.0–100.0)
Monocytes Absolute: 0.6 10*3/uL (ref 0.1–1.0)
Monocytes Relative: 12 %
Neutro Abs: 2.6 10*3/uL (ref 1.7–7.7)
Neutrophils Relative %: 50 %
Platelets: 209 10*3/uL (ref 150–400)
RBC: 5.1 MIL/uL (ref 4.22–5.81)
RDW: 12.3 % (ref 11.5–15.5)
WBC: 5 10*3/uL (ref 4.0–10.5)
nRBC: 0 % (ref 0.0–0.2)

## 2022-02-21 LAB — T4, FREE: Free T4: 1.13 ng/dL — ABNORMAL HIGH (ref 0.61–1.12)

## 2022-02-21 LAB — TSH: TSH: 2.211 u[IU]/mL (ref 0.350–4.500)

## 2022-02-21 NOTE — Progress Notes (Signed)
Feeling at his baseline. Some diff swallowing steak and meats like that. Denies any pain.

## 2022-02-21 NOTE — Progress Notes (Signed)
[place] [facility], [place] Phone: [phone]  Virtual Visit Progress Note  I connected with Troy [name] on 02/21/22 at  1:00 PM EST by video enabled telemedicine visit and verified that I am speaking with the correct person using two identifiers.   I discussed the limitations, risks, security and privacy concerns of performing an evaluation and management service by telemedicine and the availability of in-person appointments. I also discussed with the patient that there may be a patient responsible charge related to this service. The patient expressed understanding and agreed to proceed.   Other persons participating in the visit and their role in the encounter: none   Patient's location: clinic  Provider's location: home   Clinic Day:  02/21/2022  Referring physician: Juline Patch, MD  Chief Complaint: Troy [name] is a 72 y.o. 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 male with above history of recurrent SCC of tongue who returns to clinic for continued surveillance. He reports feeling well. No unintentional weight loss, difficulty or painful swallowing. No new lumps or bumps. Denies new oral lesions. Is unsure when he was last seen by ENT, possibly last year.   Review of Systems  Constitutional:  Negative for chills, fever, malaise/fatigue and weight loss.  HENT:  Negative for hearing loss, nosebleeds, sore throat and tinnitus.   Eyes:  Negative for blurred vision and double vision.  Respiratory:  Negative for cough, hemoptysis, shortness of breath and wheezing.   Cardiovascular:  Negative for chest pain, palpitations and leg swelling.  Gastrointestinal:  Negative for abdominal pain, blood in stool, constipation, diarrhea, melena, nausea and vomiting.  Genitourinary:  Negative for dysuria and urgency.  Musculoskeletal:  Negative for back pain,  falls, joint pain and myalgias.  Skin:  Negative for itching and rash.  Neurological:  Negative for dizziness, tingling, sensory change, loss of consciousness, weakness and headaches.  Endo/Heme/Allergies:  Negative for environmental allergies. Does not bruise/bleed easily.  Psychiatric/Behavioral:  Negative for depression. The patient is not nervous/anxious and does not have insomnia.    Past Medical History:  Diagnosis Date   Cancer ([place])    GERD (gastroesophageal reflux disease)    Hypertension    Thyroid disease    Past Surgical History:  Procedure Laterality Date   EXCISION OF TONGUE LESION Bilateral 02/27/2020   Procedure: EXCISION OF TONGUE AND MOUTH LESION;  Surgeon: Margaretha Sheffield, MD;  Location: [facility];  Service: ENT;  Laterality: Bilateral;   tumor removed     throat    Family History  Problem Relation Age of Onset   Cancer Mother    Cancer Sister    Cancer Maternal Aunt    Cancer Paternal Uncle     Social History:  reports that he has never smoked. He has never used smokeless tobacco. He reports current alcohol use of about 2.0 standard drinks of alcohol per week. He reports that he does not use drugs.    Allergies:  Allergies  Allergen Reactions   Ace Inhibitors Cough    Current Medications: Current Outpatient Medications  Medication Sig Dispense Refill   acetaminophen (TYLENOL) 650 MG CR tablet Take 1,300 mg by mouth every 8 (eight) hours.     ALLERGY RELIEF 10 MG tablet Take 1 tablet (10 mg total) by mouth daily. 30 tablet 0   amLODipine (NORVASC) 10 MG tablet Take  1 tablet (10 mg total) by mouth daily. 30 tablet 0   aspirin EC 81 MG tablet Take 81 mg by mouth daily.     atorvastatin (LIPITOR) 10 MG tablet Take 1 tablet (10 mg total) by mouth daily. 30 tablet 1   esomeprazole (NEXIUM) 40 MG capsule Take 1 capsule (40 mg total) by mouth daily. 30 capsule 5   levothyroxine (SYNTHROID) 125 MCG tablet Take 1 tablet (125 mcg total) by mouth daily. 30  tablet 5   sildenafil (VIAGRA) 100 MG tablet Take 0.5-1 tablets (50-100 mg total) by mouth daily as needed for erectile dysfunction. 5 tablet 11   No current facility-administered medications for this visit.    Performance status (ECOG):  0  Vitals  Blood pressure 134/82, pulse 92, weight 177 lb (80.3 kg), SpO2 100 %.  Physical Exam Constitutional:      Appearance: He is not ill-appearing.  Pulmonary:     Effort: No respiratory distress.  Neurological:     Mental Status: He is alert and oriented to person, place, and time.  Psychiatric:        Mood and Affect: Mood normal.        Behavior: Behavior normal.        Latest Ref Rng & Units 01/29/2021   10:00 AM 07/28/2020   11:06 AM 01/13/2020   10:59 AM  CBC  WBC 4.0 - 10.5 K/uL 5.0  5.5  6.0   Hemoglobin 13.0 - 17.0 g/dL 15.7  16.3  16.2   Hematocrit 39.0 - 52.0 % 45.7  47.3  48.4   Platelets 150 - 400 K/uL 207  230  237       Latest Ref Rng & Units 12/30/2021    9:31 AM 07/06/2021    9:30 AM 01/29/2021   10:00 AM  CMP  Glucose 70 - 99 mg/dL 111  116  118   BUN 8 - 27 mg/dL '14  12  17   '$ Creatinine 0.76 - 1.27 mg/dL 1.51  1.73  1.76   Sodium 134 - 144 mmol/L 141  141  135   Potassium 3.5 - 5.2 mmol/L 4.4  4.6  3.6   Chloride 96 - 106 mmol/L 101  101  100   CO2 20 - 29 mmol/L '25  28  26   '$ Calcium 8.6 - 10.2 mg/dL 9.4  8.9  8.5   Total Protein 6.5 - 8.1 g/dL   7.2   Total Bilirubin 0.3 - 1.2 mg/dL   0.6   Alkaline Phos 38 - 126 U/L   46   AST 15 - 41 U/L   21   ALT 0 - 44 U/L   17    Lab Results  Component Value Date   TSH 3.510 12/30/2021   TSH 6.290 (H) 07/06/2021   TSH 12.652 (H) 01/29/2021   TSH 0.389 07/28/2020   TSH 0.174 (L) 07/06/2020   TSH 0.324 (L) 01/13/2020   TSH 0.391 07/08/2019   TSH 1.067 04/08/2019   TSH 0.402 01/03/2019   TSH 0.486 10/26/2018   TSH 0.617 10/05/2018   TSH 0.320 (L) 09/04/2018   TSH 0.569 07/10/2018   TSH 0.362 06/08/2018   TSH 1.290 05/18/2018   TSH 0.458 04/06/2018    TSH 0.259 (L) 02/16/2018   TSH 4.230 12/29/2017   TSH 0.600 12/08/2017   TSH 0.757 11/17/2017   TSH 1.350 10/27/2017   TSH 8.210 (H) 10/06/2017   TSH 1.900 09/15/2017   TSH 0.444 (L) 08/25/2017  TSH 4.680 (H) 07/28/2017   TSH 0.429 (L) 07/07/2017   TSH 1.880 06/16/2017   TSH 0.618 05/05/2017   TSH 1.510 04/14/2017   TSH 5.170 (H) 03/31/2017   09/20/2018- PET  IMPRESSION: 1. No specific findings identified to suggest recurrent tumor within the base of tongue or cervical nodal metastasis. No evidence for distant FDG avid metastatic disease. 2. New, asymmetric focus of increased radiotracer uptake localizing to the area posterior to the left mandibular condyle. Subtle area of increased soft tissue is noted on the corresponding CT images. The appearance is nonspecific. This would be an unusual location for recurrent tumor or metastatic disease. This may represent arthro pathic changes. Clinical correlation advised. If further imaging is considered indicated a contrast enhanced MRI of the soft tissues of the neck may be helpful for further assessment. 3. Aortic Atherosclerosis (ICD10-I70.0). Coronary artery calcifications.  10/19/2018- MRI Neck IMPRESSION: No anatomic explanation for the uptake posterior to the left mandibular condyle. No mass or metastatic disease identified by MRI   Assessment: Patient is 72 year old male who returns to clinic for follow up for   Recurrent Squamous Cell Carcinoma of Base of Tongue- Previously followed by Dr. Grayland Ormond and Dr. Mike Gip. Initially diagnosed with squamous cell carcinoma of the base of tongue [id number] 11/2014, s/p concurrent cisplatin-radiation completed 05/21/2015. PET was reported as equivocal. Repeat pet 07/05/16 revealed increasing level 2 LN consistent with recurrent disease. Inoperable. Received palliative keytruda q3w from 10/2016 - 10/2018 (portion of tx received while incarcerated). PET 09/2018 reported new focus in left mandibular  condyle but no correlate on follow up MRI. He has been NED since. I reviewed NCCN guidelines for surveillance however, given his recurrent disease and that he is 5 years from his recurrence, I was not able to identify clear recommendations. I reviewed his case with Dr. Grayland Ormond who recommends getting CT Neck, Chest, Abdomen, Pelvis to establish baseline. If negative, we could consider 6 month to 1 year surveillance visits. Patient agrees. We also reviewed symptoms that would be concerning for recurrence that would warrant sooner return. He is at high risk of recurrent disease given past recurrence however, he completed treatment in 2020 which is reassuring. I also encouraged him to schedule appointment with Dr Kathyrn Sheriff for direct visualization every 6 months.  CKD- stage 3b. Followed by nephrology but hasn't seen Dr Holley Raring in some time. Renal function has currently improved. He can continue follow up with Dr. Ronnald Ramp.  Hypothyroidism- related to treatment for cancer. TSH was normal in November. Reports compliance with levothyroxine. Appreciate Dr. Adah Salvage management.  Erythrocytosis- Previously workup included JAK2, CO, EPO, and testosterone which were normal. Hemoglobin is currently normal. Recommend monitoring.  Oral lesions- excised by Dr Kathyrn Sheriff 02/27/20. Pathology was reported as benign.   Disposition: Ct neck, chest, abdomen, pelvis 6 mo- lab (cbc, cmp, tsh), and see me for surveillance- la   I discussed the assessment and treatment plan with the patient. The patient was provided an opportunity to ask questions and all were answered. The patient agreed with the plan and demonstrated an understanding of the instructions.   The patient was advised to call back or seek an in-person evaluation if the symptoms worsen or if the condition fails to improve as anticipated.   I spent 25 minutes face-to-face video visit time dedicated to the care of this patient on the date of this encounter to include  pre-visit review of prior oncology notes and imaging, labs, face-to-face time with the patient, and post visit  ordering of testing/documentation.    Beckey Rutter, DNP, AGNP-[facility]

## 2022-02-21 NOTE — Patient Instructions (Signed)
Please contact Dr Emeline General for appointment.

## 2022-02-25 ENCOUNTER — Ambulatory Visit
Admission: RE | Admit: 2022-02-25 | Discharge: 2022-02-25 | Disposition: A | Discharge status: Home / Self Care | Payer: Medicare HMO | Source: Ambulatory Visit | Attending: Nurse Practitioner | Admitting: Nurse Practitioner

## 2022-02-25 DIAGNOSIS — Z85810 Personal history of malignant neoplasm of tongue: Secondary | ICD-10-CM | POA: Diagnosis not present

## 2022-02-25 DIAGNOSIS — R918 Other nonspecific abnormal finding of lung field: Secondary | ICD-10-CM | POA: Diagnosis not present

## 2022-02-25 DIAGNOSIS — Z08 Encounter for follow-up examination after completed treatment for malignant neoplasm: Secondary | ICD-10-CM | POA: Diagnosis not present

## 2022-02-25 DIAGNOSIS — C01 Malignant neoplasm of base of tongue: Secondary | ICD-10-CM | POA: Diagnosis not present

## 2022-02-25 DIAGNOSIS — J392 Other diseases of pharynx: Secondary | ICD-10-CM | POA: Diagnosis not present

## 2022-02-25 DIAGNOSIS — Z8589 Personal history of malignant neoplasm of other organs and systems: Secondary | ICD-10-CM | POA: Diagnosis not present

## 2022-02-25 DIAGNOSIS — I700 Atherosclerosis of aorta: Secondary | ICD-10-CM | POA: Diagnosis not present

## 2022-02-25 DIAGNOSIS — I2510 Atherosclerotic heart disease of native coronary artery without angina pectoris: Secondary | ICD-10-CM | POA: Diagnosis not present

## 2022-02-25 DIAGNOSIS — K769 Liver disease, unspecified: Secondary | ICD-10-CM | POA: Diagnosis not present

## 2022-02-25 MED ORDER — IOHEXOL 300 MG/ML  SOLN
100.0000 mL | Freq: Once | INTRAMUSCULAR | Status: AC | PRN
  Administered 2022-02-25: 100 mL via INTRAVENOUS

## 2022-03-02 ENCOUNTER — Telehealth: Payer: Self-pay | Admitting: Nurse Practitioner

## 2022-03-02 DIAGNOSIS — Z08 Encounter for follow-up examination after completed treatment for malignant neoplasm: Secondary | ICD-10-CM

## 2022-03-02 NOTE — Telephone Encounter (Signed)
Called patient to communicate ct results. Home number is disconnected. Mobile number was not answered and vm is full.

## 2022-03-19 ENCOUNTER — Other Ambulatory Visit: Payer: Self-pay | Admitting: Family Medicine

## 2022-03-19 DIAGNOSIS — E7801 Familial hypercholesterolemia: Secondary | ICD-10-CM

## 2022-03-19 DIAGNOSIS — I10 Essential (primary) hypertension: Secondary | ICD-10-CM

## 2022-03-19 DIAGNOSIS — E782 Mixed hyperlipidemia: Secondary | ICD-10-CM

## 2022-03-21 NOTE — Telephone Encounter (Signed)
Requested Prescriptions  Pending Prescriptions Disp Refills   atorvastatin (LIPITOR) 10 MG tablet [Pharmacy Med Name: ATORVASTATIN 10 MG TABLET] 90 tablet 0    Sig: Take 1 tablet (10 mg total) by mouth daily.     Cardiovascular:  Antilipid - Statins Failed - 03/19/2022  9:34 AM      Failed - Lipid Panel in normal range within the last 12 months    Cholesterol, Total  Date Value Ref Range Status  07/06/2021 207 (H) 100 - 199 mg/dL Final   LDL Chol Calc (NIH)  Date Value Ref Range Status  07/06/2021 135 (H) 0 - 99 mg/dL Final   LDL Direct  Date Value Ref Range Status  12/30/2021 153 (H) 0 - 99 mg/dL Final   HDL  Date Value Ref Range Status  07/06/2021 51 >39 mg/dL Final   Triglycerides  Date Value Ref Range Status  07/06/2021 120 0 - 149 mg/dL Final         Passed - Patient is not pregnant      Passed - Valid encounter within last 12 months    Recent Outpatient Visits           2 months ago Essential hypertension   [facility] [place], Deanna C, MD   8 months ago Essential hypertension   [place] [facility] [place], Deanna C, MD   1 year ago Essential hypertension   [place] [facility] [place], Deanna C, MD   1 year ago Essential hypertension   [place] [facility] [place], Deanna C, MD   2 years ago Essential hypertension   [place] [facility] [place], [place], MD       Future Appointments             In 3 months Juline Patch, MD [place] at [facility], [facility]

## 2022-03-21 NOTE — Telephone Encounter (Signed)
Requested Prescriptions  Pending Prescriptions Disp Refills   amLODipine (NORVASC) 10 MG tablet [Pharmacy Med Name: AMLODIPINE BESYLATE 10 MG TAB] 90 tablet 0    Sig: Take 1 tablet (10 mg total) by mouth daily.     Cardiovascular: Calcium Channel Blockers 2 Passed - 03/19/2022  9:33 AM      Passed - Last BP in normal range    BP Readings from Last 1 Encounters:  02/21/22 134/82         Passed - Last Heart Rate in normal range    Pulse Readings from Last 1 Encounters:  02/21/22 92         Passed - Valid encounter within last 6 months    Recent Outpatient Visits           2 months ago Essential hypertension   [place] [facility] [place], Deanna C, MD   8 months ago Essential hypertension   [place] [facility] [place], Deanna C, MD   1 year ago Essential hypertension   [place] [facility] [place], Deanna C, MD   1 year ago Essential hypertension    [facility] [place], Deanna C, MD   2 years ago Essential hypertension   [place] [facility] [place], [place], MD       Future Appointments             In 3 months Juline Patch, MD [place] at [facility], [facility]

## 2022-04-08 ENCOUNTER — Other Ambulatory Visit: Payer: Self-pay | Admitting: Family Medicine

## 2022-04-08 DIAGNOSIS — I10 Essential (primary) hypertension: Secondary | ICD-10-CM

## 2022-04-08 DIAGNOSIS — E7801 Familial hypercholesterolemia: Secondary | ICD-10-CM

## 2022-04-08 NOTE — Telephone Encounter (Signed)
Last RF 03/21/22 #90   Requested Prescriptions  Refused Prescriptions Disp Refills   amLODipine (NORVASC) 10 MG tablet [Pharmacy Med Name: AMLODIPINE BESYLATE 10 MG TAB] 90 tablet 0    Sig: Take 1 tablet (10 mg total) by mouth daily.     Cardiovascular: Calcium Channel Blockers 2 Passed - 04/08/2022 12:17 PM      Passed - Last BP in normal range    BP Readings from Last 1 Encounters:  02/21/22 134/82         Passed - Last Heart Rate in normal range    Pulse Readings from Last 1 Encounters:  02/21/22 92         Passed - Valid encounter within last 6 months    Recent Outpatient Visits           3 months ago Essential hypertension   [place] [facility] [place], Deanna C, MD   9 months ago Essential hypertension   [place] [facility] [place], Deanna C, MD   1 year ago Essential hypertension   [place] [facility] [place], Deanna C, MD   1 year ago Essential hypertension   [place] [facility] [place], Deanna C, MD   2 years ago Essential hypertension   [place] [facility] [place], [place], MD       Future Appointments             In 2 months Juline Patch, MD [place] at [facility], [facility]

## 2022-06-30 ENCOUNTER — Ambulatory Visit: Payer: Medicare HMO | Admitting: Family Medicine

## 2022-06-30 DIAGNOSIS — E7801 Familial hypercholesterolemia: Secondary | ICD-10-CM

## 2022-06-30 DIAGNOSIS — E890 Postprocedural hypothyroidism: Secondary | ICD-10-CM

## 2022-06-30 DIAGNOSIS — E782 Mixed hyperlipidemia: Secondary | ICD-10-CM

## 2022-06-30 DIAGNOSIS — I10 Essential (primary) hypertension: Secondary | ICD-10-CM

## 2022-06-30 DIAGNOSIS — K219 Gastro-esophageal reflux disease without esophagitis: Secondary | ICD-10-CM

## 2022-06-30 DIAGNOSIS — N5201 Erectile dysfunction due to arterial insufficiency: Secondary | ICD-10-CM

## 2022-07-03 ENCOUNTER — Other Ambulatory Visit: Payer: Self-pay | Admitting: Family Medicine

## 2022-07-03 DIAGNOSIS — I10 Essential (primary) hypertension: Secondary | ICD-10-CM

## 2022-07-03 DIAGNOSIS — E7801 Familial hypercholesterolemia: Secondary | ICD-10-CM

## 2022-07-03 DIAGNOSIS — E782 Mixed hyperlipidemia: Secondary | ICD-10-CM

## 2022-07-05 ENCOUNTER — Encounter: Payer: Self-pay | Admitting: Family Medicine

## 2022-07-12 ENCOUNTER — Other Ambulatory Visit: Payer: Self-pay | Admitting: Family Medicine

## 2022-07-12 DIAGNOSIS — I10 Essential (primary) hypertension: Secondary | ICD-10-CM

## 2022-07-12 DIAGNOSIS — E7801 Familial hypercholesterolemia: Secondary | ICD-10-CM

## 2022-07-14 ENCOUNTER — Telehealth: Payer: Self-pay | Admitting: Nurse Practitioner

## 2022-07-14 DIAGNOSIS — C01 Malignant neoplasm of base of tongue: Secondary | ICD-10-CM

## 2022-07-14 NOTE — Telephone Encounter (Signed)
I returned Troy [name]'s phone call. She says that [name] has been complaining about increased pain at site of prior surgery recently. I reviewed CT imaging from January which ws negative for recurrent disease however, patient has not yet seen ENT for direct visualization. I recommended Troy [name] contact ENT for appointment and if results are negative, I can see him in clinic for evaluation, possible MRI. He had increased degeneration of cervical spine on ct which may be contributing to his symptoms and could be related to radiation, however, would need to evaluate him and rule out any recurrent disease. Drinda Butts thanked for call and will reach back out to our clinic once he has seen ENT.

## 2022-07-18 ENCOUNTER — Other Ambulatory Visit: Payer: Self-pay | Admitting: Family Medicine

## 2022-07-18 ENCOUNTER — Ambulatory Visit (INDEPENDENT_AMBULATORY_CARE_PROVIDER_SITE_OTHER): Payer: Medicare HMO | Admitting: Family Medicine

## 2022-07-18 ENCOUNTER — Encounter: Payer: Self-pay | Admitting: Family Medicine

## 2022-07-18 VITALS — BP 128/78 | HR 88 | Ht 66.0 in | Wt 170.0 lb

## 2022-07-18 DIAGNOSIS — E890 Postprocedural hypothyroidism: Secondary | ICD-10-CM | POA: Diagnosis not present

## 2022-07-18 DIAGNOSIS — K219 Gastro-esophageal reflux disease without esophagitis: Secondary | ICD-10-CM

## 2022-07-18 DIAGNOSIS — N5201 Erectile dysfunction due to arterial insufficiency: Secondary | ICD-10-CM

## 2022-07-18 DIAGNOSIS — I10 Essential (primary) hypertension: Secondary | ICD-10-CM | POA: Diagnosis not present

## 2022-07-18 DIAGNOSIS — E7801 Familial hypercholesterolemia: Secondary | ICD-10-CM

## 2022-07-18 DIAGNOSIS — E782 Mixed hyperlipidemia: Secondary | ICD-10-CM

## 2022-07-18 MED ORDER — ESOMEPRAZOLE MAGNESIUM 40 MG PO CPDR: 40.0000 mg | DELAYED_RELEASE_CAPSULE | Freq: Every day | ORAL | 5 refills | Status: DC

## 2022-07-18 MED ORDER — LEVOTHYROXINE SODIUM 125 MCG PO TABS
125.0000 ug | ORAL_TABLET | Freq: Every day | ORAL | 5 refills | Status: DC
Start: 2022-07-18 — End: 2023-02-17

## 2022-07-18 MED ORDER — ATORVASTATIN CALCIUM 10 MG PO TABS: 10.0000 mg | ORAL_TABLET | Freq: Every day | ORAL | 5 refills | Status: DC

## 2022-07-18 MED ORDER — SILDENAFIL CITRATE 100 MG PO TABS
50.0000 mg | ORAL_TABLET | Freq: Every day | ORAL | 5 refills | Status: DC | PRN
Start: 2022-07-18 — End: 2023-04-21

## 2022-07-18 MED ORDER — AMLODIPINE BESYLATE 10 MG PO TABS: 10.0000 mg | ORAL_TABLET | Freq: Every day | ORAL | 5 refills | Status: DC

## 2022-07-18 NOTE — Progress Notes (Signed)
Date:  07/18/2022   Name:  Troy [name]   DOB:  [birth year]   MRN:  [id number]   Chief Complaint: Hypothyroidism, Gastroesophageal Reflux, Hypertension, and Hyperlipidemia  Gastroesophageal Reflux He complains of heartburn. He reports no abdominal pain, no belching, no chest pain, no choking, no coughing, no dysphagia, no hoarse voice, no nausea, no sore throat, no stridor or no wheezing. This is a chronic problem. The problem occurs occasionally. The problem has been gradually improving. The symptoms are aggravated by certain foods. Pertinent negatives include no fatigue or weight loss. He has tried a PPI for the symptoms. The treatment provided moderate relief.  Hypertension This is a chronic problem. The problem has been gradually improving since onset. The problem is controlled. Pertinent negatives include no chest pain, headaches, orthopnea, PND or shortness of breath. There are no associated agents to hypertension. Past treatments include calcium channel blockers. The current treatment provides moderate improvement. There are no compliance problems.  Identifiable causes of hypertension include a thyroid problem.  Hyperlipidemia This is a chronic problem. The current episode started more than 1 year ago. Recent lipid tests were reviewed and are normal. Exacerbating diseases include hypothyroidism. Pertinent negatives include no chest pain or shortness of breath. The current treatment provides moderate improvement of lipids. There are no compliance problems.   Thyroid Problem Presents for follow-up visit. Patient reports no anxiety, constipation, depressed mood, diarrhea, fatigue, hair loss, heat intolerance, hoarse voice, nail problem, weight gain or weight loss. The symptoms have been improving. His past medical history is significant for hyperlipidemia.    Lab Results  Component Value Date   NA 138 02/21/2022   K 3.7 02/21/2022   CO2 26 02/21/2022   GLUCOSE 112 (H) 02/21/2022   BUN  15 02/21/2022   CREATININE 1.49 (H) 02/21/2022   CALCIUM 8.9 02/21/2022   EGFR 49 (L) 12/30/2021   GFRNONAA 50 (L) 02/21/2022   Lab Results  Component Value Date   CHOL 207 (H) 07/06/2021   HDL 51 07/06/2021   LDLCALC 135 (H) 07/06/2021   LDLDIRECT 153 (H) 12/30/2021   TRIG 120 07/06/2021   CHOLHDL 3.8 03/31/2017   Lab Results  Component Value Date   TSH 2.211 02/21/2022   No results found for: "HGBA1C" Lab Results  Component Value Date   WBC 5.0 02/21/2022   HGB 16.0 02/21/2022   HCT 46.8 02/21/2022   MCV 91.8 02/21/2022   PLT 209 02/21/2022   Lab Results  Component Value Date   ALT 14 02/21/2022   AST 20 02/21/2022   ALKPHOS 49 02/21/2022   BILITOT 0.9 02/21/2022   No results found for: "25OHVITD2", "25OHVITD3", "VD25OH"   Review of Systems  Constitutional:  Negative for fatigue, fever, unexpected weight change, weight gain and weight loss.  HENT:  Negative for hoarse voice and sore throat.   Eyes:  Negative for visual disturbance.  Respiratory:  Negative for cough, choking, shortness of breath and wheezing.   Cardiovascular:  Negative for chest pain, orthopnea and PND.  Gastrointestinal:  Positive for heartburn. Negative for abdominal pain, constipation, diarrhea, dysphagia and nausea.  Endocrine: Negative for heat intolerance.  Neurological:  Negative for headaches.  Psychiatric/Behavioral:  The patient is not nervous/anxious.     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 06/22/2017   Goals of care, counseling/discussion 04/07/2017   Stage 3 chronic kidney disease (HCC) 04/03/2017   Hypothyroid 03/31/2017   ED (erectile dysfunction)  03/31/2017   Gastroesophageal reflux disease 09/23/2016   Essential hypertension 09/23/2016   Squamous cell carcinoma of base of tongue (HCC) 08/04/2016    Allergies  Allergen Reactions   Ace Inhibitors Cough    Past Surgical History:  Procedure Laterality Date   EXCISION OF TONGUE LESION Bilateral 02/27/2020   Procedure: EXCISION OF TONGUE AND MOUTH LESION;  Surgeon: Vernie Murders, MD;  [facility]: [facility];  Service: ENT;  Laterality: Bilateral;   tumor removed     throat    Social History   Tobacco Use   Smoking status: Never   Smokeless tobacco: Never   Tobacco comments:    Smoking cessation materials not required  Vaping Use   Vaping Use: Never used  Substance Use Topics   Alcohol use: Yes    Alcohol/week: 2.0 standard drinks of alcohol    Types: 2 Shots of liquor per week   Drug use: No     Medication list has been reviewed and updated.  Current Meds  Medication Sig   acetaminophen (TYLENOL) 650 MG CR tablet Take 1,300 mg by mouth every 8 (eight) hours.   ALLERGY RELIEF 10 MG tablet Take 1 tablet (10 mg total) by mouth daily.   amLODipine (NORVASC) 10 MG tablet Take 1 tablet (10 mg total) by mouth daily.   aspirin EC 81 MG tablet Take 81 mg by mouth daily.   atorvastatin (LIPITOR) 10 MG tablet Take 1 tablet (10 mg total) by mouth daily.   esomeprazole (NEXIUM) 40 MG capsule Take 1 capsule (40 mg total) by mouth daily.   levothyroxine (SYNTHROID) 125 MCG tablet Take 1 tablet (125 mcg total) by mouth daily.   sildenafil (VIAGRA) 100 MG tablet Take 0.5-1 tablets (50-100 mg total) by mouth daily as needed for erectile dysfunction.       07/18/2022    3:33 PM 07/06/2021    8:41 AM 07/06/2020   10:36 AM 01/07/2020    8:22 AM  GAD 7 : Generalized Anxiety Score  Nervous, Anxious, on Edge 0 0 0 0  Control/stop worrying 0 0 0 0  Worry too much - different things 0 1 0 0  Trouble relaxing 0 0 0 0  Restless 0 0 0 0  Easily annoyed or irritable 0 1 1 0   Afraid - awful might happen 0 0 0 0  Total GAD 7 Score 0 2 1 0  Anxiety Difficulty Not difficult at all Not difficult at all Not difficult at all        07/18/2022    3:33 PM 12/31/2021    9:46 AM 07/06/2021    8:40 AM  Depression screen PHQ 2/9  Decreased Interest 0 0 2  Down, Depressed, Hopeless 0 0 0  PHQ - 2 Score 0 0 2  Altered sleeping 0  0  Tired, decreased energy 0  0  Change in appetite 0  3  Feeling bad or failure about yourself  0  0  Trouble concentrating 0  0  Moving slowly or fidgety/restless   0  Suicidal thoughts   0  PHQ-9 Score 0  5  Difficult doing work/chores Not difficult at all  Not difficult at all    BP Readings  from Last 3 Encounters:  07/18/22 128/78  02/21/22 134/82  12/30/21 136/78    Physical Exam Vitals and nursing note reviewed.  HENT:     Head: Normocephalic.     Right Ear: External ear normal.     Left Ear: External ear normal.     Nose: Nose normal.  Eyes:     General: No scleral icterus.       Right eye: No discharge.        Left eye: No discharge.     Conjunctiva/sclera: Conjunctivae normal.     Pupils: Pupils are equal, round, and reactive to light.  Neck:     Thyroid: No thyromegaly.     Vascular: No JVD.     Trachea: No tracheal deviation.  Cardiovascular:     Rate and Rhythm: Normal rate and regular rhythm.     Heart sounds: Normal heart sounds. No murmur heard.    No friction rub. No gallop.  Pulmonary:     Effort: No respiratory distress.     Breath sounds: Normal breath sounds. No wheezing or rales.  Abdominal:     General: Bowel sounds are normal.     Palpations: Abdomen is soft. There is no mass.     Tenderness: There is no abdominal tenderness. There is no guarding or rebound.  Musculoskeletal:        General: No tenderness. Normal range of motion.     Cervical back: Normal range of motion and neck supple.  Lymphadenopathy:     Cervical: No cervical adenopathy.  Skin:    General: Skin is warm.     Findings:  No rash.  Neurological:     Mental Status: He is alert and oriented to person, place, and time.     Cranial Nerves: No cranial nerve deficit.     Deep Tendon Reflexes: Reflexes are normal and symmetric.     Wt Readings from Last 3 Encounters:  07/18/22 170 lb (77.1 kg)  02/21/22 177 lb (80.3 kg)  12/30/21 175 lb (79.4 kg)    BP 128/78   Pulse 88   Ht 5\' 6"  (1.676 m)   Wt 170 lb (77.1 kg)   SpO2 98%   BMI 27.44 kg/m   Assessment and Plan: 1. Familial hypercholesterolemia Chronic.  Controlled.  Stable. - amLODipine (NORVASC) 10 MG tablet; Take 1 tablet (10 mg total) by mouth daily.  Dispense: 30 tablet; Refill: 5  2. Essential hypertension Chronic.  Controlled.  Stable.  Blood pressure 128/78.  Asymptomatic.  Tolerating medications well.  Will continue amlodipine 10 mg once a day.  Will check renal function panel. - amLODipine (NORVASC) 10 MG tablet; Take 1 tablet (10 mg total) by mouth daily.  Dispense: 30 tablet; Refill: 5 - Renal Function Panel  3. Mixed hyperlipidemia .  Controlled.  Stable.  Continue atorvastatin 10 mg once a day.  Will check lipid panel for current level of LDL control. - atorvastatin (LIPITOR) 10 MG tablet; Take 1 tablet (10 mg total) by mouth daily.  Dispense: 30 tablet; Refill: 5 - Lipid Panel With LDL/HDL Ratio  4. Gastroesophageal reflux disease, unspecified whether esophagitis present .  Controlled.  Stable.  Continue Nexium 40 mg once a day. - esomeprazole (NEXIUM) 40 MG capsule; Take 1 capsule (40 mg total) by mouth daily.  Dispense: 30 capsule; Refill: 5  5. Postoperative hypothyroidism Chronic.  Controlled.  Stable.  Pending TSH level we will likely continue levothyroxine 125 mcg daily. - levothyroxine (SYNTHROID) 125 MCG  tablet; Take 1 tablet (125 mcg total) by mouth daily.  Dispense: 30 tablet; Refill: 5 - TSH  6. Erectile dysfunction due to arterial insufficiency .  Controlled.  Stable.  Continue sildenafil 100 mg 1/2 to 1 tablet as  needed as needed. - sildenafil (VIAGRA) 100 MG tablet; Take 0.5-1 tablets (50-100 mg total) by mouth daily as needed for erectile dysfunction.  Dispense: 5 tablet; Refill: 5     Elizabeth Sauer, MD

## 2022-07-19 LAB — LIPID PANEL WITH LDL/HDL RATIO
Cholesterol, Total: 159 mg/dL (ref 100–199)
HDL: 59 mg/dL (ref >39)
LDL Chol Calc (NIH): 85 mg/dL (ref 0–99)
LDL/HDL Ratio: 1.4 ratio (ref 0.0–3.6)
Triglycerides: 82 mg/dL (ref 0–149)
VLDL Cholesterol Cal: 15 mg/dL (ref 5–40)

## 2022-07-19 LAB — RENAL FUNCTION PANEL
Albumin: 4.2 g/dL (ref 3.8–4.8)
BUN/Creatinine Ratio: 11 (ref 10–24)
BUN: 17 mg/dL (ref 8–27)
CO2: 26 mmol/L (ref 20–29)
Calcium: 9.3 mg/dL (ref 8.6–10.2)
Chloride: 102 mmol/L (ref 96–106)
Creatinine, Ser: 1.5 mg/dL — ABNORMAL HIGH (ref 0.76–1.27)
Glucose: 93 mg/dL (ref 70–99)
Phosphorus: 3.3 mg/dL (ref 2.8–4.1)
Potassium: 4.5 mmol/L (ref 3.5–5.2)
Sodium: 140 mmol/L (ref 134–144)
eGFR: 49 mL/min/{1.73_m2} — ABNORMAL LOW (ref >59)

## 2022-07-19 LAB — TSH: TSH: 2.23 u[IU]/mL (ref 0.450–4.500)

## 2022-08-22 ENCOUNTER — Inpatient Hospital Stay: Payer: Medicare HMO | Admitting: Nurse Practitioner

## 2022-08-22 ENCOUNTER — Encounter: Payer: Self-pay | Admitting: Nurse Practitioner

## 2022-08-22 ENCOUNTER — Inpatient Hospital Stay: Payer: Medicare HMO | Attending: Nurse Practitioner

## 2022-09-20 ENCOUNTER — Encounter: Payer: Self-pay | Admitting: Family Medicine

## 2022-09-20 ENCOUNTER — Ambulatory Visit: Payer: Medicare HMO | Admitting: Family Medicine

## 2022-09-20 VITALS — BP 120/78 | HR 85 | Temp 98.8°F | Ht 66.0 in | Wt 166.0 lb

## 2022-09-20 DIAGNOSIS — R6883 Chills (without fever): Secondary | ICD-10-CM | POA: Diagnosis not present

## 2022-09-20 DIAGNOSIS — U071 COVID-19: Secondary | ICD-10-CM

## 2022-09-20 DIAGNOSIS — R051 Acute cough: Secondary | ICD-10-CM

## 2022-09-20 LAB — POC COVID19 BINAXNOW: SARS Coronavirus 2 Ag: POSITIVE — AB

## 2022-09-20 NOTE — Patient Instructions (Signed)
Infection Prevention in the Home If you have an infection, may have been exposed to an infection, or are taking care of someone who has an infection, it is important to know how to keep the infection from spreading. Follow your health care provider's instructions and use these guidelines to help stop the spread of infection. How infections are spread In order for an infection to spread, the following must be present: A germ. This may be a virus, bacteria, fungus, or parasite. A place for the germ to live. This may be: On or in a person, animal, plant, or food. In soil or water. On surfaces, such as a door handle. A person or animal who can develop a disease if the germ enters the body (host). The host does not have resistance to the germ. A way for the germ to enter the host. This may occur by: Direct contact with an infected person or animal. This can happen through shaking hands or hugging. Some germs can also travel through the air and spread to others. This can happen when an infected person coughs or sneezes on or near other people. Indirect contact. This occurs when the germ enters the host through contact with an infected object. Examples include: Eating or drinking food or water that is contaminated with the germ. Touching a contaminated surface with your hands, and then touching your face, eyes, nose, or mouth. Supplies needed: Soap. Alcohol-based hand sanitizer. Standard cleaning products. Disinfectants, such as bleach. Reusable cleaning cloths, sponges, or paper towels. Disposable or reusable utility gloves. How to prevent infection from spreading There are several things that you can do to help prevent infection from spreading. Take these general actions Everyone should take the following actions to prevent the spread of infection: Wash your hands often with soap and water for at least 20 seconds. If soap and water are not available, use alcohol-based hand sanitizer. Avoid  touching your face, mouth, nose, or eyes. Cough or sneeze into a tissue, sleeve, or elbow instead of into your hand or into the air. If you cough or sneeze into a tissue, throw it away immediately and wash your hands.  Keep your bathroom clean Provide soap. Change towels and washcloths frequently. Change toothbrushes often and store them separately in a clean, dry place. Clean and disinfect all surfaces, including the toilet, floor, tub, shower, and sink. Do not share personal items, such as razors, toothbrushes, deodorant, combs, brushes, towels, and washcloths. Maintain hygiene in the [facility] your hands before and after preparing food and before you eat. Clean the inside of your refrigerator each week. Keep your refrigerator set at 67F (4C) or less, and set your freezer at 74F (-18C) or less. Keep work surfaces clean. Disinfect them regularly. Wash your dishes in hot, soapy water. Air-dry your dishes or use a dishwasher. Do not share dishes or eating utensils. Handle food safely Store food carefully. Refrigerate leftovers promptly in covered containers. Throw out stale or spoiled food. Thaw foods in the refrigerator or microwave, not at room temperature. Serve foods at the proper temperature. Do not eat raw meat. Make sure it is cooked to the appropriate temperature. Cook eggs until they are firm. Wash fruits and vegetables under running water. Use separate cutting boards, plates, and utensils for raw foods and cooked foods. Use a clean spoon each time you sample food while cooking. Do laundry the right way Wear gloves if laundry is visibly soiled. Do not shake soiled laundry. Doing that may send germs  into the air. Wash laundry in hot water. If you cannot wash the laundry right away, place it in a plastic bag and wash it as soon as possible. Be careful around animals and pets Wash your hands before and after touching animals. If you have a pet, ensure that your pet stays  clean. Do not let people with weak immune systems touch bird droppings, fish tank water, or a litter box. If you have a pet cage or litter box, be sure to clean it every day. If you are sick, stay away from animals and have someone else care for them if possible. How to clean and disinfect objects and surfaces Precautions Some disinfectants work for certain germs and not others. Read the manufacturer's instructions or read online resources to determine if the product you are using will work for the germ you are trying to remove. If you choose to use bleach, use it safely. Never mix it with other cleaning products, especially those that contain ammonia. This mixture can create a dangerous gas that may be deadly. Keep proper movement of fresh air in your home (ventilation). Pour used mop water down the utility sink or toilet. Do not pour this water down the kitchen sink. Objects and surfaces  If surfaces are visibly soiled, clean them first with soap and water before disinfecting. Disinfect surfaces that are frequently touched every day. This may include: Counters. Tables. Doorknobs. Sinks and faucets. Electronics, such as: Engineer, technical sales. Remote controls. Keyboards. Computers and tablets. Cleaning supplies Some cleaning supplies can breed germs. Take good care of them to prevent germs from spreading. To do this: Soak toilet brushes, mops, and sponges in bleach and water for 5 minutes after each use, or according to manufacturer's instructions. Wash reusable cleaning cloths and sanitize sponges after each use. Throw away disposable gloves after one use. Replace reusable utility gloves if they are cracked or torn or if they start to peel. Additional actions if you are sick If you live with other people:  Avoid close contact with those around you. Stay at least 3 ft (1 m) away from others, if possible. Use a separate bathroom, if possible. If possible, sleep in a separate bedroom or in a  separate bed to prevent infecting other household members. Change bedroom linens each week or whenever they are soiled. Have everyone in the household wash hands often with soap and water for at least 20 seconds. If soap and water are not available, use alcohol-based hand sanitizer. In general: Stay home except to get medical care. Call ahead before visiting your health care provider. Ask others to get groceries and household supplies and to refill prescriptions for you. Avoid public areas. Try not to take public transportation. If you can, wear a mask if you need to go out of the house, or if you are in close contact with someone who is not sick. Avoid visitors until you have completely recovered, or until you have no signs and symptoms of infection. Avoid preparing food or providing care for others. If you must prepare food or provide care for others, wear a mask and wash your hands before and after doing these things. Where to find more information Centers for Disease Control and Prevention: TonerPromos.no Summary It is important to know how to keep infection from spreading. Make sure everyone in your household washes their hands often with soap and water. Disinfect surfaces that are frequently touched every day. If you are sick, stay home except to get medical care. This  information is not intended to replace advice given to you by your health care provider. Make sure you discuss any questions you have with your health care provider. Document Revised: 03/22/2021 Document Reviewed: 03/22/2021 Elsevier Patient Education  2024 [employer]. COVID-19 COVID-19 is an infection caused by a virus called SARS-CoV-2. This type of virus is called a coronavirus. People with COVID-19 may: Have little to no symptoms. Have mild to moderate symptoms that affect their lungs and breathing. Get very sick. What are the causes? COVID-19 is caused by a virus. This virus may be in the air as droplets or on  surfaces. It can spread from an infected person when they cough, sneeze, speak, sing, or breathe. You may become infected if: You breathe in the infected droplets in the air. You touch an object that has the virus on it. What increases the risk? You are at risk of getting COVID-19 if you have been around someone with the infection. You may be more likely to get very sick if: You are 54 years old or older. You have certain medical conditions, such as: Heart disease. Diabetes. Chronic respiratory disease. Cancer. Pregnancy. You are immunocompromised. This means your body cannot fight infections easily. You have a disability or trouble moving, meaning you're immobile. What are the signs or symptoms? People may have different symptoms from COVID-19. The symptoms can also be mild to severe. They often show up in 5-6 days after being infected. But they can take up to 14 days to appear. Common symptoms are: Cough. Feeling tired. New loss of taste or smell. Fever. Less common symptoms are: Sore throat. Headache. Body or muscle aches. Diarrhea. A skin rash or odd-colored fingers or toes. Red or irritated eyes. Sometimes, COVID-19 does not cause symptoms. How is this diagnosed? COVID-19 can be diagnosed with tests done in the lab or at home. Fluid from your nose, mouth, or lungs will be used to check for the virus. How is this treated? Treatment for COVID-19 depends on how sick you are. Mild symptoms can be treated at home with rest, fluids, and over-the-counter medicines. Severe symptoms may be treated in a hospital intensive care unit (ICU). If you have symptoms and are at risk of getting very sick, you may be given a medicine that fights viruses. This medicine is called an antiviral. How is this prevented? To protect yourself from COVID-19: Know your risk factors. Get vaccinated. If your body cannot fight infections easily, talk to your provider about treatment to help prevent  COVID-19. Stay at least 1 meter away from others. Wear a well-fitted mask when: You can't stay at a distance from people. You're in a place with poor air flow. Try to be in open spaces with good air flow when in public. Wash your hands often or use an alcohol-based hand sanitizer. Cover your nose and mouth when coughing and sneezing. If you think you have COVID-19 or have been around someone who has it, stay home and be by yourself for 5-10 days. Where to find more information Centers for Disease Control and Prevention (CDC): TonerPromos.no World Health Organization [facility]): VisitDestination.com.br Get help right away if: You have trouble breathing or get short of breath. You have pain or pressure in your chest. You cannot speak or move any part of your body. You are confused. Your symptoms get worse. These symptoms may be an emergency. Get help right away. Call 911. Do not wait to see if the symptoms will go away. Do not drive yourself  to the hospital. This information is not intended to replace advice given to you by your health care provider. Make sure you discuss any questions you have with your health care provider. Document Revised: 02/08/2022 Document Reviewed: 10/15/2021 Elsevier Patient Education  2024 [employer].

## 2022-09-20 NOTE — Progress Notes (Signed)
Date:  09/20/2022   Name:  Troy [name]   DOB:  21-Jan-1951   MRN:  [id number]   Chief Complaint: Chills (Started about 4 days ago with itchy throat, chills, congestion- not tested for COVID)  Fever  This is a new problem. The current episode started in the past 7 days. The problem occurs intermittently. The maximum temperature noted was 99 to 99.9 F. Associated symptoms include congestion, coughing, headaches, muscle aches, nausea and a sore throat. Pertinent negatives include no chest pain, diarrhea, sleepiness, urinary pain, vomiting or wheezing. He has tried acetaminophen for the symptoms. The treatment provided moderate relief.    Lab Results  Component Value Date   NA 140 07/18/2022   K 4.5 07/18/2022   CO2 26 07/18/2022   GLUCOSE 93 07/18/2022   BUN 17 07/18/2022   CREATININE 1.50 (H) 07/18/2022   CALCIUM 9.3 07/18/2022   EGFR 49 (L) 07/18/2022   GFRNONAA 50 (L) 02/21/2022   Lab Results  Component Value Date   CHOL 159 07/18/2022   HDL 59 07/18/2022   LDLCALC 85 07/18/2022   LDLDIRECT 153 (H) 12/30/2021   TRIG 82 07/18/2022   CHOLHDL 3.8 03/31/2017   Lab Results  Component Value Date   TSH 2.230 07/18/2022   No results found for: "HGBA1C" Lab Results  Component Value Date   WBC 5.0 02/21/2022   HGB 16.0 02/21/2022   HCT 46.8 02/21/2022   MCV 91.8 02/21/2022   PLT 209 02/21/2022   Lab Results  Component Value Date   ALT 14 02/21/2022   AST 20 02/21/2022   ALKPHOS 49 02/21/2022   BILITOT 0.9 02/21/2022   No results found for: "25OHVITD2", "25OHVITD3", "VD25OH"   Review of Systems  Constitutional:  Positive for chills, diaphoresis, fatigue and fever.  HENT:  Positive for congestion and sore throat. Negative for nosebleeds, postnasal drip, sinus pressure and sinus pain.   Respiratory:  Positive for cough. Negative for chest tightness, shortness of breath and wheezing.   Cardiovascular:  Negative for chest pain, palpitations and leg swelling.   Gastrointestinal:  Positive for nausea. Negative for blood in stool, diarrhea and vomiting.  Genitourinary:  Negative for dysuria and hematuria.  Neurological:  Positive for headaches.    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 06/22/2017   Goals of care, counseling/discussion 04/07/2017   Stage 3 chronic kidney disease (HCC) 04/03/2017   Hypothyroid 03/31/2017   ED (erectile dysfunction) 03/31/2017   Gastroesophageal reflux disease 09/23/2016   Essential hypertension 09/23/2016   Squamous cell carcinoma of base of tongue (HCC) 08/04/2016    Allergies  Allergen Reactions   Ace Inhibitors Cough    Past Surgical History:  Procedure Laterality Date   EXCISION OF TONGUE LESION Bilateral 02/27/2020   Procedure: EXCISION OF TONGUE AND MOUTH LESION;  Surgeon: Vernie Murders, MD;  Location: [facility];  Service: ENT;  Laterality: Bilateral;   tumor removed     throat    Social History   Tobacco Use   Smoking status: Never   Smokeless tobacco: Never   Tobacco comments:    Smoking cessation materials not required  Vaping Use   Vaping status: Never  Used  Substance Use Topics   Alcohol use: Yes    Alcohol/week: 2.0 standard drinks of alcohol    Types: 2 Shots of liquor per week   Drug use: No     Medication list has been reviewed and updated.  No outpatient medications have been marked as taking for the 09/20/22 encounter (Office Visit) with Duanne Limerick, MD.       09/20/2022   10:32 AM 07/18/2022    3:33 PM 07/06/2021    8:41 AM 07/06/2020   10:36 AM  GAD 7 : Generalized Anxiety Score  Nervous, Anxious, on Edge 0 0 0 0  Control/stop  worrying 0 0 0 0  Worry too much - different things 0 0 1 0  Trouble relaxing 0 0 0 0  Restless 0 0 0 0  Easily annoyed or irritable 0 0 1 1  Afraid - awful might happen 0 0 0 0  Total GAD 7 Score 0 0 2 1  Anxiety Difficulty Not difficult at all Not difficult at all Not difficult at all Not difficult at all       09/20/2022   10:31 AM 07/18/2022    3:33 PM 12/31/2021    9:46 AM  Depression screen PHQ 2/9  Decreased Interest 0 0 0  Down, Depressed, Hopeless 0 0 0  PHQ - 2 Score 0 0 0  Altered sleeping 0 0   Tired, decreased energy 0 0   Change in appetite 0 0   Feeling bad or failure about yourself  0 0   Trouble concentrating 0 0   Moving slowly or fidgety/restless 0    Suicidal thoughts 0    PHQ-9 Score 0 0   Difficult doing work/chores Not difficult at all Not difficult at all     BP Readings from Last 3 Encounters:  09/20/22 120/78  07/18/22 128/78  02/21/22 134/82    Physical Exam Vitals and nursing note reviewed.  HENT:     Head: Normocephalic.     Right Ear: Tympanic membrane, ear canal and external ear normal.     Left Ear: Tympanic membrane, ear canal and external ear normal.     Nose: Nose normal.  Eyes:     General: No scleral icterus.       Right eye: No discharge.        Left eye: No discharge.     Conjunctiva/sclera: Conjunctivae normal.     Pupils: Pupils are equal, round, and reactive to light.  Neck:     Thyroid: No thyromegaly.     Vascular: No JVD.     Trachea: No tracheal deviation.  Cardiovascular:     Rate and Rhythm: Normal rate and regular rhythm.     Heart sounds: Normal heart sounds. No murmur heard.    No friction rub. No gallop.  Pulmonary:     Effort: No respiratory distress.     Breath sounds: Normal breath sounds. No wheezing or rales.  Abdominal:     General: Bowel sounds are normal.     Palpations: Abdomen is soft. There is no mass.     Tenderness: There is no abdominal tenderness. There is no guarding or rebound.   Musculoskeletal:        General: No tenderness. Normal range of motion.     Cervical back: Normal range of motion and neck supple.  Lymphadenopathy:     Cervical: No cervical adenopathy.  Skin:    General: Skin is warm.  Findings: No rash.  Neurological:     Mental Status: He is alert and oriented to person, place, and time.     Cranial Nerves: No cranial nerve deficit.     Deep Tendon Reflexes: Reflexes are normal and symmetric.     Wt Readings from Last 3 Encounters:  09/20/22 166 lb (75.3 kg)  07/18/22 170 lb (77.1 kg)  02/21/22 177 lb (80.3 kg)    BP 120/78   Pulse 85   Temp 98.8 F (37.1 C) (Oral)   Ht 5\' 6"  (1.676 m)   Wt 166 lb (75.3 kg)   SpO2 96%   BMI 26.79 kg/m   Assessment and Plan:  1. Chills New onset chills on Friday after going fishing with some friends on Wednesday.  Patient continued to have some mild fever but no shortness of breath significant decline medically.  Patient has continued to hydrate and is treating achiness with acetaminophen.  Patient's minimum performed about isolation and to wait until Thursday to be out and about with a mask - POC COVID-19  2. COVID COVID test is positive but I think he is in latter stages and that this began on Friday and is not necessary to use a antiviral.  Lungs are clear there is no wheezing there is no rales rhonchi cardiac exam is normal.  3. Acute cough Patient been encouraged to use Mucinex for mucolytic agent and if need for cough dextromethorphan over-the-counter as well   Elizabeth Sauer, MD

## 2022-10-04 ENCOUNTER — Inpatient Hospital Stay: Payer: Medicare HMO | Admitting: Nurse Practitioner

## 2022-10-04 ENCOUNTER — Inpatient Hospital Stay: Payer: Medicare HMO

## 2022-10-04 ENCOUNTER — Other Ambulatory Visit: Payer: Self-pay

## 2022-10-04 DIAGNOSIS — C01 Malignant neoplasm of base of tongue: Secondary | ICD-10-CM

## 2022-10-11 ENCOUNTER — Encounter: Payer: Self-pay | Admitting: Family Medicine

## 2022-10-11 ENCOUNTER — Ambulatory Visit (INDEPENDENT_AMBULATORY_CARE_PROVIDER_SITE_OTHER): Payer: Medicare HMO | Admitting: Family Medicine

## 2022-10-11 VITALS — BP 124/76 | HR 92 | Ht 66.0 in | Wt 168.0 lb

## 2022-10-11 DIAGNOSIS — L03119 Cellulitis of unspecified part of limb: Secondary | ICD-10-CM

## 2022-10-11 DIAGNOSIS — L309 Dermatitis, unspecified: Secondary | ICD-10-CM

## 2022-10-11 MED ORDER — PREDNISONE 10 MG PO TABS
ORAL_TABLET | ORAL | 1 refills | Status: DC
Start: 2022-10-11 — End: 2023-08-03

## 2022-10-11 MED ORDER — MUPIROCIN 2 % EX OINT: 1.0000 | TOPICAL_OINTMENT | Freq: Two times a day (BID) | CUTANEOUS | 0 refills | Status: DC

## 2022-10-11 NOTE — Progress Notes (Signed)
Date:  10/11/2022   Name:  Troy [name]   DOB:  1950/08/02   MRN:  [id number]   Chief Complaint: Rash (Poison ivy on arms, hand and across feet- itches. Came up yesterday)  Rash This is a new problem. The current episode started yesterday. The problem is unchanged. The affected locations include the left arm, right arm, left lower leg and right lower leg. The rash is characterized by redness and itchiness. He was exposed to an insect bite/sting (fishing with grandson at pond). Pertinent negatives include no cough, facial edema, fever or shortness of breath. Past treatments include nothing.    Lab Results  Component Value Date   NA 140 07/18/2022   K 4.5 07/18/2022   CO2 26 07/18/2022   GLUCOSE 93 07/18/2022   BUN 17 07/18/2022   CREATININE 1.50 (H) 07/18/2022   CALCIUM 9.3 07/18/2022   EGFR 49 (L) 07/18/2022   GFRNONAA 50 (L) 02/21/2022   Lab Results  Component Value Date   CHOL 159 07/18/2022   HDL 59 07/18/2022   LDLCALC 85 07/18/2022   LDLDIRECT 153 (H) 12/30/2021   TRIG 82 07/18/2022   CHOLHDL 3.8 03/31/2017   Lab Results  Component Value Date   TSH 2.230 07/18/2022   No results found for: "HGBA1C" Lab Results  Component Value Date   WBC 5.0 02/21/2022   HGB 16.0 02/21/2022   HCT 46.8 02/21/2022   MCV 91.8 02/21/2022   PLT 209 02/21/2022   Lab Results  Component Value Date   ALT 14 02/21/2022   AST 20 02/21/2022   ALKPHOS 49 02/21/2022   BILITOT 0.9 02/21/2022   No results found for: "25OHVITD2", "25OHVITD3", "VD25OH"   Review of Systems  Constitutional:  Negative for fever.  Respiratory:  Negative for cough, shortness of breath and wheezing.   Cardiovascular:  Negative for chest pain, palpitations and leg swelling.  Skin:  Positive for rash.    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 06/22/2017   Goals of care, counseling/discussion 04/07/2017   Stage 3 chronic kidney disease (HCC) 04/03/2017   Hypothyroid 03/31/2017   ED (erectile dysfunction) 03/31/2017   Gastroesophageal reflux disease 09/23/2016   Essential hypertension 09/23/2016   Squamous cell carcinoma of base of tongue (HCC) 08/04/2016    Allergies  Allergen Reactions   Ace Inhibitors Cough    Past Surgical History:  Procedure Laterality Date   EXCISION OF TONGUE LESION Bilateral 02/27/2020   Procedure: EXCISION OF TONGUE AND MOUTH LESION;  Surgeon: Vernie Murders, MD;  Location: [facility];  Service: ENT;  Laterality: Bilateral;   tumor removed     throat    Social History   Tobacco Use   Smoking status: Never   Smokeless tobacco: Never   Tobacco comments:    Smoking cessation materials not required  Vaping Use   Vaping status: Never Used  Substance Use Topics   Alcohol use: Yes    Alcohol/week: 2.0 standard drinks of alcohol    Types: 2 Shots of liquor per week   Drug use: No     Medication list has been reviewed and updated.  Current Meds  Medication Sig  acetaminophen (TYLENOL) 650 MG CR tablet Take 1,300 mg by mouth every 8 (eight) hours.   ALLERGY RELIEF 10 MG tablet Take 1 tablet (10 mg total) by mouth daily.   amLODipine (NORVASC) 10 MG tablet Take 1 tablet (10 mg total) by mouth daily.   aspirin EC 81 MG tablet Take 81 mg by mouth daily.   atorvastatin (LIPITOR) 10 MG tablet Take 1 tablet (10 mg total) by mouth daily.   esomeprazole (NEXIUM) 40 MG capsule Take 1 capsule (40 mg total) by mouth daily.   levothyroxine (SYNTHROID) 125 MCG tablet Take 1 tablet (125 mcg total) by mouth daily.   sildenafil (VIAGRA) 100 MG tablet Take 0.5-1 tablets (50-100 mg total) by mouth daily as needed for erectile  dysfunction.       10/11/2022    2:08 PM 09/20/2022   10:32 AM 07/18/2022    3:33 PM 07/06/2021    8:41 AM  GAD 7 : Generalized Anxiety Score  Nervous, Anxious, on Edge 0 0 0 0  Control/stop worrying 0 0 0 0  Worry too much - different things 0 0 0 1  Trouble relaxing 0 0 0 0  Restless 0 0 0 0  Easily annoyed or irritable 0 0 0 1  Afraid - awful might happen 0 0 0 0  Total GAD 7 Score 0 0 0 2  Anxiety Difficulty Not difficult at all Not difficult at all Not difficult at all Not difficult at all       10/11/2022    2:08 PM 09/20/2022   10:31 AM 07/18/2022    3:33 PM  Depression screen PHQ 2/9  Decreased Interest 0 0 0  Down, Depressed, Hopeless 0 0 0  PHQ - 2 Score 0 0 0  Altered sleeping 0 0 0  Tired, decreased energy 0 0 0  Change in appetite 0 0 0  Feeling bad or failure about yourself  0 0 0  Trouble concentrating 0 0 0  Moving slowly or fidgety/restless 0 0   Suicidal thoughts 0 0   PHQ-9 Score 0 0 0  Difficult doing work/chores Not difficult at all Not difficult at all Not difficult at all    BP Readings from Last 3 Encounters:  10/11/22 124/76  09/20/22 120/78  07/18/22 128/78    Physical Exam Vitals and nursing note reviewed.  HENT:     Head: Normocephalic.     Right Ear: External ear normal.     Left Ear: External ear normal.     Nose: Nose normal.  Eyes:     General: No scleral icterus.       Right eye: No discharge.        Left eye: No discharge.     Conjunctiva/sclera: Conjunctivae normal.     Pupils: Pupils are equal, round, and reactive to light.  Neck:     Thyroid: No thyromegaly.     Vascular: No JVD.     Trachea: No tracheal deviation.  Cardiovascular:     Rate and Rhythm: Normal rate and regular rhythm.     Heart sounds: Normal heart sounds. No murmur heard.    No friction rub. No gallop.  Pulmonary:     Effort: No respiratory distress.     Breath sounds: Normal breath sounds. No wheezing or rales.  Abdominal:     General: Bowel sounds are  normal.     Palpations: Abdomen is soft. There is no mass.     Tenderness: There is no  abdominal tenderness. There is no guarding or rebound.  Musculoskeletal:     Cervical back: Normal range of motion and neck supple.  Lymphadenopathy:     Cervical: No cervical adenopathy.  Skin:    General: Skin is warm.     Findings: Rash present.  Neurological:     Mental Status: He is alert.     Deep Tendon Reflexes: Reflexes are normal and symmetric.     Wt Readings from Last 3 Encounters:  10/11/22 168 lb (76.2 kg)  09/20/22 166 lb (75.3 kg)  07/18/22 170 lb (77.1 kg)    BP 124/76   Pulse 92   Ht 5\' 6"  (1.676 m)   Wt 168 lb (76.2 kg)   SpO2 97%   BMI 27.12 kg/m   Assessment and Plan:  1. Dermatitis New onset.  Persistent.  Relatively stable but generalized on the lower legs and arm consistent with either plant related dermatitis and/or insect bites.  Will treat with prednisone taper beginning at 60 mg over 14 days. - predniSONE (DELTASONE) 10 MG tablet; Taper 6,6,6,5,5,5,4,4,3,3,2,2,1,1  Dispense: 53 tablet; Refill: 1  2. Cellulitis of lower extremity, unspecified laterality New onset.  Erythematous base consistent with a likely with insect bites as well we will treat with Bactroban ointment applied twice a day to each bite site as can be determined. - mupirocin ointment (BACTROBAN) 2 %; Apply 1 Application topically 2 (two) times daily.  Dispense: 22 g; Refill: 0    Elizabeth Sauer, MD

## 2022-10-13 ENCOUNTER — Encounter: Payer: Self-pay | Admitting: Nurse Practitioner

## 2022-10-13 ENCOUNTER — Inpatient Hospital Stay (HOSPITAL_BASED_OUTPATIENT_CLINIC_OR_DEPARTMENT_OTHER): Payer: Medicare HMO | Admitting: Nurse Practitioner

## 2022-10-13 ENCOUNTER — Inpatient Hospital Stay: Payer: Medicare HMO | Attending: Nurse Practitioner

## 2022-10-13 VITALS — BP 150/90 | HR 87 | Temp 97.1°F | Wt 167.0 lb

## 2022-10-13 DIAGNOSIS — D72829 Elevated white blood cell count, unspecified: Secondary | ICD-10-CM | POA: Insufficient documentation

## 2022-10-13 DIAGNOSIS — Z8589 Personal history of malignant neoplasm of other organs and systems: Secondary | ICD-10-CM | POA: Diagnosis not present

## 2022-10-13 DIAGNOSIS — Z923 Personal history of irradiation: Secondary | ICD-10-CM | POA: Insufficient documentation

## 2022-10-13 DIAGNOSIS — Z9221 Personal history of antineoplastic chemotherapy: Secondary | ICD-10-CM | POA: Diagnosis not present

## 2022-10-13 DIAGNOSIS — Z79899 Other long term (current) drug therapy: Secondary | ICD-10-CM | POA: Insufficient documentation

## 2022-10-13 DIAGNOSIS — Z79630 Long term (current) use of alkylating agent: Secondary | ICD-10-CM | POA: Insufficient documentation

## 2022-10-13 DIAGNOSIS — N1832 Chronic kidney disease, stage 3b: Secondary | ICD-10-CM | POA: Insufficient documentation

## 2022-10-13 DIAGNOSIS — E039 Hypothyroidism, unspecified: Secondary | ICD-10-CM | POA: Insufficient documentation

## 2022-10-13 DIAGNOSIS — Z08 Encounter for follow-up examination after completed treatment for malignant neoplasm: Secondary | ICD-10-CM

## 2022-10-13 DIAGNOSIS — C01 Malignant neoplasm of base of tongue: Secondary | ICD-10-CM | POA: Diagnosis not present

## 2022-10-13 LAB — CBC WITH DIFFERENTIAL/PLATELET
Abs Immature Granulocytes: 0.21 10*3/uL — ABNORMAL HIGH (ref 0.00–0.07)
Basophils Absolute: 0 10*3/uL (ref 0.0–0.1)
Basophils Relative: 0 %
Eosinophils Absolute: 0 10*3/uL (ref 0.0–0.5)
Eosinophils Relative: 0 %
HCT: 43.9 % (ref 39.0–52.0)
Hemoglobin: 14.8 g/dL (ref 13.0–17.0)
Immature Granulocytes: 2 %
Lymphocytes Relative: 15 %
Lymphs Abs: 1.8 10*3/uL (ref 0.7–4.0)
MCH: 31.4 pg (ref 26.0–34.0)
MCHC: 33.7 g/dL (ref 30.0–36.0)
MCV: 93 fL (ref 80.0–100.0)
Monocytes Absolute: 1.6 10*3/uL — ABNORMAL HIGH (ref 0.1–1.0)
Monocytes Relative: 13 %
Neutro Abs: 8.9 10*3/uL — ABNORMAL HIGH (ref 1.7–7.7)
Neutrophils Relative %: 70 %
Platelets: 196 10*3/uL (ref 150–400)
RBC: 4.72 MIL/uL (ref 4.22–5.81)
RDW: 12.4 % (ref 11.5–15.5)
WBC: 12.5 10*3/uL — ABNORMAL HIGH (ref 4.0–10.5)
nRBC: 0 % (ref 0.0–0.2)

## 2022-10-13 LAB — COMPREHENSIVE METABOLIC PANEL
ALT: 18 U/L (ref 0–44)
AST: 23 U/L (ref 15–41)
Albumin: 3.9 g/dL (ref 3.5–5.0)
Alkaline Phosphatase: 46 U/L (ref 38–126)
Anion gap: 8 (ref 5–15)
BUN: 18 mg/dL (ref 8–23)
CO2: 26 mmol/L (ref 22–32)
Calcium: 9 mg/dL (ref 8.9–10.3)
Chloride: 105 mmol/L (ref 98–111)
Creatinine, Ser: 1.29 mg/dL — ABNORMAL HIGH (ref 0.61–1.24)
GFR, Estimated: 59 mL/min — ABNORMAL LOW (ref >60)
Glucose, Bld: 112 mg/dL — ABNORMAL HIGH (ref 70–99)
Potassium: 3.8 mmol/L (ref 3.5–5.1)
Sodium: 139 mmol/L (ref 135–145)
Total Bilirubin: 0.4 mg/dL (ref 0.3–1.2)
Total Protein: 7.2 g/dL (ref 6.5–8.1)

## 2022-10-13 LAB — T4, FREE: Free T4: 1.28 ng/dL — ABNORMAL HIGH (ref 0.61–1.12)

## 2022-10-13 LAB — TSH: TSH: 0.432 u[IU]/mL (ref 0.350–4.500)

## 2022-10-13 NOTE — Addendum Note (Signed)
Addended by: Alinda Deem H on: 10/13/2022 10:33 AM   Modules accepted: Orders

## 2022-10-13 NOTE — Progress Notes (Signed)
[facility] at [facility], [place] Phone: [phone]  Progress Note  Clinic Day:  10/13/2022  Referring physician: Duanne Limerick, MD  Chief Complaint: Troy [name] is a 72 y.o. 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 male with history of recurrent SCC of tongue who returns to clinic for continued surveillance. He reports feeling well. No unintentional weight loss, difficulty or painful swallowing. No new lumps or bumps. Denies new oral lesions. He has not seen ENT since 2022. Has some teeth that need pulling.   Review of Systems  Constitutional:  Negative for chills, fever, malaise/fatigue and weight loss.  HENT:  Negative for hearing loss, nosebleeds, sore throat and tinnitus.   Eyes:  Negative for blurred vision and double vision.  Respiratory:  Negative for cough, hemoptysis, shortness of breath and wheezing.   Cardiovascular:  Negative for chest pain, palpitations and leg swelling.  Gastrointestinal:  Negative for abdominal pain, blood in stool, constipation, diarrhea, melena, nausea and vomiting.  Genitourinary:  Negative for dysuria and urgency.  Musculoskeletal:  Negative for back pain, falls, joint pain and myalgias.  Skin:  Negative for itching and rash.  Neurological:  Negative for dizziness, tingling, sensory change, loss of consciousness, weakness and headaches.  Endo/Heme/Allergies:  Negative for environmental allergies. Does not bruise/bleed easily.  Psychiatric/Behavioral:  Negative for depression. The patient is not nervous/anxious and does not have insomnia.    Past Medical History:  Diagnosis Date   Cancer (HCC)    GERD (gastroesophageal reflux disease)    Hypertension    Thyroid disease    Past Surgical History:  Procedure Laterality Date   EXCISION OF TONGUE LESION Bilateral 02/27/2020   Procedure: EXCISION OF TONGUE AND MOUTH  LESION;  Surgeon: Vernie Murders, MD;  Location: [facility];  Service: ENT;  Laterality: Bilateral;   tumor removed     throat    Family History  Problem Relation Age of Onset   Cancer Mother    Cancer Sister    Cancer Maternal Aunt    Cancer Paternal Uncle     Social History:  reports that he has never smoked. He has never used smokeless tobacco. He reports current alcohol use of about 2.0 standard drinks of alcohol per week. He reports that he does not use drugs.    Allergies:  Allergies  Allergen Reactions   Ace Inhibitors Cough    Current Medications: Current Outpatient Medications  Medication Sig Dispense Refill   acetaminophen (TYLENOL) 650 MG CR tablet Take 1,300 mg by mouth every 8 (eight) hours.     ALLERGY RELIEF 10 MG tablet Take 1 tablet (10 mg total) by mouth daily. 30 tablet 0   amLODipine (NORVASC) 10 MG tablet Take 1 tablet (10 mg total) by mouth daily. 30 tablet 5   aspirin EC 81 MG tablet Take 81 mg by mouth daily.     atorvastatin (LIPITOR) 10 MG tablet Take 1 tablet (10 mg total) by mouth daily. 30 tablet 5   esomeprazole (NEXIUM) 40 MG capsule Take 1 capsule (40 mg total) by mouth daily. 30 capsule 5   levothyroxine (SYNTHROID) 125 MCG tablet Take 1 tablet (125 mcg total) by mouth daily. 30 tablet 5   mupirocin ointment (BACTROBAN) 2 % Apply 1 Application topically 2 (two) times daily. 22 g 0   predniSONE (DELTASONE) 10 MG tablet Taper 6,6,6,5,5,5,4,4,3,3,2,2,1,1 53  tablet 1   sildenafil (VIAGRA) 100 MG tablet Take 0.5-1 tablets (50-100 mg total) by mouth daily as needed for erectile dysfunction. 5 tablet 5   No current facility-administered medications for this visit.    Performance status (ECOG):  0  Vitals  Blood pressure (!) 150/90, pulse 87, temperature (!) 97.1 F (36.2 C), temperature source Tympanic, weight 167 lb (75.8 kg), SpO2 97%.  Physical Exam Vitals reviewed.  Constitutional:      Appearance: He is not ill-appearing.  HENT:      Right Ear: External ear normal.     Left Ear: External ear normal.     Nose: Nose normal.     Mouth/Throat:     Mouth: Mucous membranes are moist.     Pharynx: Oropharynx is clear. No oropharyngeal exudate or posterior oropharyngeal erythema.  Cardiovascular:     Rate and Rhythm: Normal rate and regular rhythm.  Pulmonary:     Effort: No respiratory distress.  Abdominal:     General: There is no distension.     Tenderness: There is no abdominal tenderness.  Musculoskeletal:     Cervical back: No tenderness.     Comments: Ambulates w/o aids  Lymphadenopathy:     Cervical: No cervical adenopathy.  Skin:    Coloration: Skin is not pale.     Findings: No bruising.  Neurological:     Mental Status: He is alert and oriented to person, place, and time.  Psychiatric:        Mood and Affect: Mood normal.        Behavior: Behavior normal.        Latest Ref Rng & Units 10/13/2022    9:17 AM 02/21/2022    1:09 PM 01/29/2021   10:00 AM  CBC  WBC 4.0 - 10.5 K/uL 12.5  5.0  5.0   Hemoglobin 13.0 - 17.0 g/dL 16.1  09.6  04.5   Hematocrit 39.0 - 52.0 % 43.9  46.8  45.7   Platelets 150 - 400 K/uL 196  209  207       Latest Ref Rng & Units 07/18/2022    4:19 PM 02/21/2022    1:09 PM 12/30/2021    9:31 AM  CMP  Glucose 70 - 99 mg/dL 93  409  811   BUN 8 - 27 mg/dL 17  15  14    Creatinine 0.76 - 1.27 mg/dL 9.14  7.82  9.56   Sodium 134 - 144 mmol/L 140  138  141   Potassium 3.5 - 5.2 mmol/L 4.5  3.7  4.4   Chloride 96 - 106 mmol/L 102  104  101   CO2 20 - 29 mmol/L 26  26  25    Calcium 8.6 - 10.2 mg/dL 9.3  8.9  9.4   Total Protein 6.5 - 8.1 g/dL  7.3    Total Bilirubin 0.3 - 1.2 mg/dL  0.9    Alkaline Phos 38 - 126 U/L  49    AST 15 - 41 U/L  20    ALT 0 - 44 U/L  14     Lab Results  Component Value Date   TSH 2.230 07/18/2022   TSH 2.211 02/21/2022   TSH 3.510 12/30/2021   TSH 6.290 (H) 07/06/2021   TSH 12.652 (H) 01/29/2021   TSH 0.389 07/28/2020   TSH 0.174 (L)  07/06/2020   TSH 0.324 (L) 01/13/2020   TSH 0.391 07/08/2019   TSH 1.067 04/08/2019   TSH 0.402 01/03/2019  TSH 0.486 10/26/2018   TSH 0.617 10/05/2018   TSH 0.320 (L) 09/04/2018   TSH 0.569 07/10/2018   TSH 0.362 06/08/2018   TSH 1.290 05/18/2018   TSH 0.458 04/06/2018   TSH 0.259 (L) 02/16/2018   TSH 4.230 12/29/2017   TSH 0.600 12/08/2017   TSH 0.757 11/17/2017   TSH 1.350 10/27/2017   TSH 8.210 (H) 10/06/2017   TSH 1.900 09/15/2017   TSH 0.444 (L) 08/25/2017   TSH 4.680 (H) 07/28/2017   TSH 0.429 (L) 07/07/2017   TSH 1.880 06/16/2017   TSH 0.618 05/05/2017   TSH 1.510 04/14/2017   TSH 5.170 (H) 03/31/2017   02/25/22 - CT neck/chest/abdomen/pelvis    Assessment: Patient is 72 year old male who returns to clinic for follow up for   Recurrent Squamous Cell Carcinoma of Base of Tongue- Previously followed by Dr. Orlie Dakin and Dr. Merlene Pulling. Initially diagnosed with squamous cell carcinoma of the base of tongue [id number] 11/2014, s/p concurrent cisplatin-radiation completed 05/21/2015. PET was reported as equivocal. Repeat pet 07/05/16 revealed increasing level 2 LN consistent with recurrent disease. Inoperable. Received palliative keytruda q3w from 10/2016 - 10/2018 (portion of tx received while incarcerated). PET 09/2018 reported new focus in left mandibular condyle but no correlate on follow up MRI. He has been NED since. CT Neck, Chest, Abdomen, Pelvis from January 2023 was independently reviewed without evidence of progressive or recurrent disease. He has not seen ENT since post op in 2022. Clinically doing well. I again encouraged him to see ENT for surveillance and direct visualization. I would recommend additional imaging studies if he becomes symptomatic. Again reviewed that he is high risk of recurrent disease given his past recurrence. We reviewed symptoms that would be concerning for recurrence and would warrant sooner return. Return to clinic in 1 year for surveillance.  CKD-  stage 3b. Followed by nephrology but hasn't seen Dr Cherylann Ratel in some time. Renal function has currently improved. He can continue follow up with Dr. Yetta Barre.  Hypothyroidism- related to treatment for cancer. TSH was normal in June. Will defer management to Dr Yetta Barre. Reports compliance with levothyroxine.   Erythrocytosis- Previously workup included JAK2, CO, EPO, and testosterone which were normal. Hemoglobin is currently normal. Recommend monitoring.  Oral lesions- excised by Dr Elenore Rota 02/27/20. Pathology was reported as benign.  Leukocytosis- etiology unclear. Asymptomatic. Question recent covid infection. Follow up with PCP.  Poor dentition- hx of radiation contributing. He can see dentistry for management.   Disposition: 1 year- lab (cbc, cmp), see me for surveillance- la   I discussed the assessment and treatment plan with the patient. The patient was provided an opportunity to ask questions and all were answered. The patient agreed with the plan and demonstrated an understanding of the instructions.   The patient was advised to call back or seek an in-person evaluation if the symptoms worsen or if the condition fails to improve as anticipated.    Consuello Masse, DNP, AGNP-[facility]

## 2022-10-13 NOTE — Patient Instructions (Signed)
Please contact [facility] to schedule an appointment. If we can assist with this, please let our office know. It was a pleasure seeing you today and thank you for allowing me to participate in your care. -Consuello Masse, NP

## 2022-11-30 ENCOUNTER — Ambulatory Visit (INDEPENDENT_AMBULATORY_CARE_PROVIDER_SITE_OTHER): Payer: Medicare HMO

## 2022-11-30 DIAGNOSIS — Z23 Encounter for immunization: Secondary | ICD-10-CM

## 2023-01-23 ENCOUNTER — Other Ambulatory Visit: Payer: Self-pay | Admitting: Family Medicine

## 2023-01-23 DIAGNOSIS — K219 Gastro-esophageal reflux disease without esophagitis: Secondary | ICD-10-CM

## 2023-02-16 ENCOUNTER — Other Ambulatory Visit: Payer: Self-pay | Admitting: Family Medicine

## 2023-02-16 DIAGNOSIS — E7801 Familial hypercholesterolemia: Secondary | ICD-10-CM

## 2023-02-16 DIAGNOSIS — I10 Essential (primary) hypertension: Secondary | ICD-10-CM

## 2023-02-16 DIAGNOSIS — K219 Gastro-esophageal reflux disease without esophagitis: Secondary | ICD-10-CM

## 2023-02-16 DIAGNOSIS — E782 Mixed hyperlipidemia: Secondary | ICD-10-CM

## 2023-02-16 DIAGNOSIS — E890 Postprocedural hypothyroidism: Secondary | ICD-10-CM

## 2023-03-09 ENCOUNTER — Ambulatory Visit (INDEPENDENT_AMBULATORY_CARE_PROVIDER_SITE_OTHER): Payer: Medicare HMO | Admitting: Emergency Medicine

## 2023-03-09 VITALS — Ht 67.0 in | Wt 175.0 lb

## 2023-03-09 DIAGNOSIS — Z0100 Encounter for examination of eyes and vision without abnormal findings: Secondary | ICD-10-CM

## 2023-03-09 DIAGNOSIS — Z0000 Encounter for general adult medical examination without abnormal findings: Secondary | ICD-10-CM | POA: Diagnosis not present

## 2023-03-09 DIAGNOSIS — Z1211 Encounter for screening for malignant neoplasm of colon: Secondary | ICD-10-CM

## 2023-03-09 NOTE — Progress Notes (Signed)
Subjective:   Troy [name] is a 73 y.o. male who presents for Medicare Annual/Subsequent preventive examination.  This patient declined Interactive audio and Acupuncturist. Therefore the visit was completed with audio only.   Visit Complete: Virtual I connected with  Troy [name] on 03/09/23 by a audio enabled telemedicine application and verified that I am speaking with the correct person using two identifiers.  Patient Location: Home  Provider Location: Home Office  I discussed the limitations of evaluation and management by telemedicine. The patient expressed understanding and agreed to proceed.  Vital Signs: Because this visit was a virtual/telehealth visit, some criteria may be missing or patient reported. Any vitals not documented were not able to be obtained and vitals that have been documented are patient reported.   Cardiac Risk Factors include: advanced age (>90men, >53 women);male gender;hypertension     Objective:    Today's Vitals   03/09/23 0753  Weight: 175 lb (79.4 kg)  Height: 5\' 7"  (1.702 m)   Body mass index is 27.41 kg/m.     03/09/2023    8:02 AM 01/29/2021   10:30 AM 12/23/2020   10:15 AM 07/28/2020   11:10 AM 02/27/2020    7:57 AM 01/13/2020   11:54 AM 12/23/2019    9:28 AM  Advanced Directives  Does Patient Have a Medical Advance Directive? No No No No No Yes No  Type of Careers adviser;Living will   Does patient want to make changes to medical advance directive?      No - Patient declined   Copy of Healthcare Power of Attorney in Chart?      No - copy requested   Would patient like information on creating a medical advance directive? No - Patient declined No - Patient declined Yes (MAU/Ambulatory/Procedural Areas - Information given) No - Patient declined No - Patient declined No - Patient declined No - Patient declined    Current Medications (verified) Outpatient Encounter Medications as of  03/09/2023  Medication Sig   acetaminophen (TYLENOL) 650 MG CR tablet Take 1,300 mg by mouth every 8 (eight) hours.   ALLERGY RELIEF 10 MG tablet Take 1 tablet (10 mg total) by mouth daily.   amLODipine (NORVASC) 10 MG tablet Take 1 tablet (10 mg total) by mouth daily.   aspirin EC 81 MG tablet Take 81 mg by mouth daily.   atorvastatin (LIPITOR) 10 MG tablet Take 1 tablet (10 mg total) by mouth daily.   esomeprazole (NEXIUM) 40 MG capsule Take 1 capsule (40 mg total) by mouth daily.   levothyroxine (SYNTHROID) 125 MCG tablet Take 1 tablet (125 mcg total) by mouth daily.   sildenafil (VIAGRA) 100 MG tablet Take 0.5-1 tablets (50-100 mg total) by mouth daily as needed for erectile dysfunction.   mupirocin ointment (BACTROBAN) 2 % Apply 1 Application topically 2 (two) times daily. (Patient not taking: Reported on 03/09/2023)   predniSONE (DELTASONE) 10 MG tablet Taper 6,6,6,5,5,5,4,4,3,3,2,2,1,1 (Patient not taking: Reported on 03/09/2023)   No facility-administered encounter medications on file as of 03/09/2023.    Allergies (verified) Ace inhibitors   History: Past Medical History:  Diagnosis Date   Cancer (HCC)    GERD (gastroesophageal reflux disease)    Hypertension    Thyroid disease    Past Surgical History:  Procedure Laterality Date   EXCISION OF TONGUE LESION Bilateral 02/27/2020   Procedure: EXCISION OF TONGUE AND MOUTH LESION;  Surgeon: Vernie Murders, MD;  Location: [facility];  Service: ENT;  Laterality: Bilateral;   tumor removed     throat   Family History  Problem Relation Age of Onset   Cancer Mother    Stroke Father    Hypertension Father    Cancer Sister    Cancer Maternal Aunt    Cancer Paternal Uncle    Social History   Socioeconomic History   Marital status: Divorced    Spouse name: Not on file   Number of children: 2   Years of education: Not on file   Highest education level: Not on file  Occupational History   Occupation: Retired  Tobacco  Use   Smoking status: Never   Smokeless tobacco: Never   Tobacco comments:    Smoking cessation materials not required  Vaping Use   Vaping status: Never Used  Substance and Sexual Activity   Alcohol use: Yes    Alcohol/week: 2.0 standard drinks of alcohol    Types: 2 Standard drinks or equivalent per week    Comment: 1 wine cooler twice a week   Drug use: No   Sexual activity: Yes  Other Topics Concern   Not on file  Social History Narrative   Pt lives with his sister   Social Drivers of Corporate investment banker Strain: Low Risk  (03/09/2023)   Overall Financial Resource Strain (CARDIA)    Difficulty of Paying Living Expenses: Not very hard  Food Insecurity: No Food Insecurity (03/09/2023)   Hunger Vital Sign    Worried About Running Out of Food in the Last Year: Never true    Ran Out of Food in the Last Year: Never true  Transportation Needs: No Transportation Needs (03/09/2023)   PRAPARE - Administrator, Civil Service (Medical): No    Lack of Transportation (Non-Medical): No  Physical Activity: Insufficiently Active (03/09/2023)   Exercise Vital Sign    Days of Exercise per Week: 2 days    Minutes of Exercise per Session: 30 min  Stress: No Stress Concern Present (03/09/2023)   [employer] of Occupational Health - Occupational Stress Questionnaire    Feeling of Stress : Not at all  Social Connections: Moderately Isolated (03/09/2023)   Social Connection and Isolation Panel [NHANES]    Frequency of Communication with Friends and Family: More than three times a week    Frequency of Social Gatherings with Friends and Family: More than three times a week    Attends Religious Services: 1 to 4 times per year    Active Member of [employer] or Organizations: No    Attends Banker Meetings: Never    Marital Status: Divorced    Tobacco Counseling Counseling given: Not Answered Tobacco comments: Smoking cessation materials not required   Clinical  Intake:  Pre-visit preparation completed: Yes  Pain : No/denies pain     BMI - recorded: 27.41 Nutritional Status: BMI 25 -29 Overweight Nutritional Risks: None Diabetes: No  How often do you need to have someone help you when you read instructions, pamphlets, or other written materials from your doctor or pharmacy?: 1 - Never  Interpreter Needed?: No  Information entered by :: Tora Kindred, CMA   Activities of Daily Living    03/09/2023    7:55 AM  In your present state of health, do you have any difficulty performing the following activities:  Hearing? 0  Vision? 0  Difficulty concentrating or making decisions? 0  Walking or climbing stairs? 0  Dressing or bathing? 0  Doing errands, shopping? 0  Preparing Food and eating ? N  Using the Toilet? N  In the past six months, have you accidently leaked urine? N  Do you have problems with loss of bowel control? N  Managing your Medications? N  Managing your Finances? N  Housekeeping or managing your Housekeeping? N    Patient Care Team: Duanne Limerick, MD as PCP - General (Family Medicine) Mady Haagensen, MD (Internal Medicine) Alinda Dooms, NP as Nurse Practitioner (Nurse Practitioner)  Indicate any recent Medical Services you may have received from other than [facility] providers in the past year (date may be approximate).     Assessment:   This is a routine wellness examination for Troy [name].  Hearing/Vision screen Hearing Screening - Comments:: Denies hearing loss Vision Screening - Comments:: Doesn't get eye exams. Placed referral to [facility] [place]   Goals Addressed               This Visit's Progress     DIET - EAT MORE FRUITS AND VEGETABLES (pt-stated)        Depression Screen    03/09/2023    8:00 AM 10/11/2022    2:08 PM 09/20/2022   10:31 AM 07/18/2022    3:33 PM 12/31/2021    9:46 AM 07/06/2021    8:40 AM 12/23/2020   10:15 AM  PHQ 2/9 Scores  PHQ - 2 Score 0 0 0 0 0 2 0  PHQ- 9 Score  0 0 0   5     Fall Risk    03/09/2023    8:06 AM 10/11/2022    2:08 PM 09/20/2022   10:31 AM 07/18/2022    3:33 PM 12/31/2021    9:47 AM  Fall Risk   Falls in the past year? 0 0 0 0 0  Number falls in past yr: 0 0 0 0 0  Injury with Fall? 0 0 0 0 0  Risk for fall due to : No Fall Risks  No Fall Risks  No Fall Risks  Follow up Falls prevention discussed Falls evaluation completed Falls evaluation completed  Falls evaluation completed    MEDICARE RISK AT HOME: Medicare Risk at Home Any stairs in or around the home?: Yes If so, are there any without handrails?: No Home free of loose throw rugs in walkways, pet beds, electrical cords, etc?: Yes Adequate lighting in your home to reduce risk of falls?: Yes Life alert?: No Use of a cane, walker or w/c?: No Grab bars in the bathroom?: No Shower chair or bench in shower?: No Elevated toilet seat or a handicapped toilet?: No  TIMED UP AND GO:  Was the test performed?  No    Cognitive Function:        03/09/2023    8:07 AM 12/31/2021    9:49 AM 12/23/2020   10:18 AM 12/23/2019    9:30 AM  6CIT Screen  What Year? 0 points 0 points 0 points 0 points  What month? 0 points 0 points 0 points 0 points  What time? 0 points 0 points 0 points 0 points  Count back from 20 0 points 0 points 0 points 0 points  Months in reverse 4 points 4 points 4 points 4 points  Repeat phrase 6 points 8 points 10 points 4 points  Total Score 10 points 12 points 14 points 8 points    Immunizations Immunization History  Administered Date(s) Administered   Fluad Quad(high  Dose 65+) 03/05/2019, 12/23/2019, 12/24/2020, 12/30/2021   Fluad Trivalent(High Dose 65+) 11/30/2022   Moderna Sars-Covid-2 Vaccination 04/17/2019, 05/15/2019   Pneumococcal Conjugate-13 03/31/2017   Tdap 03/31/2017    TDAP status: Up to date  Flu Vaccine status: Up to date  Pneumococcal vaccine status: Due, Education has been provided regarding the importance of this vaccine. Advised may  receive this vaccine at local pharmacy or Health Dept. Aware to provide a copy of the vaccination record if obtained from local pharmacy or Health Dept. Verbalized acceptance and understanding.  Covid-19 vaccine status: Declined, Education has been provided regarding the importance of this vaccine but patient still declined. Advised may receive this vaccine at local pharmacy or Health Dept.or vaccine clinic. Aware to provide a copy of the vaccination record if obtained from local pharmacy or Health Dept. Verbalized acceptance and understanding.  Qualifies for Shingles Vaccine? Yes   Zostavax completed No   Shingrix Completed?: No.    Education has been provided regarding the importance of this vaccine. Patient has been advised to call insurance company to determine out of pocket expense if they have not yet received this vaccine. Advised may also receive vaccine at local pharmacy or Health Dept. Verbalized acceptance and understanding. Patient declined  Screening Tests Health Maintenance  Topic Date Due   Zoster Vaccines- Shingrix (1 of 2) Never done   COVID-19 Vaccine (3 - Moderna risk series) 06/12/2019   Pneumonia Vaccine 70+ Years old (2 of 2 - PPSV23 or PCV20) 09/20/2023 (Originally 05/26/2017)   Colonoscopy  09/20/2023 (Originally 02/14/2022)   Hepatitis C Screening  09/20/2023 (Originally 11/23/1968)   Medicare Annual Wellness (AWV)  03/08/2024   DTaP/Tdap/Td (2 - Td or Tdap) 04/01/2027   INFLUENZA VACCINE  Completed   HPV VACCINES  Aged Out    Health Maintenance  Health Maintenance Due  Topic Date Due   Zoster Vaccines- Shingrix (1 of 2) Never done   COVID-19 Vaccine (3 - Moderna risk series) 06/12/2019    Colon Cancer Screening: patient declined colonoscopy, but is in agreement to do cologuard.  Lung Cancer Screening: (Low Dose CT Chest recommended if Age 75-80 years, 20 pack-year currently smoking OR have quit w/in 15years.) does not qualify.   Lung Cancer Screening Referral:  n/a  Additional Screening:  Hepatitis C Screening: does qualify; Patient declined  Vision Screening: Recommended annual ophthalmology exams for early detection of glaucoma and other disorders of the eye.  Dental Screening: Recommended annual dental exams for proper oral hygiene   Community Resource Referral / Chronic Care Management: CRR required this visit?  No   CCM required this visit?  No     Plan:     I have personally reviewed and noted the following in the patient's chart:   Medical and social history Use of alcohol, tobacco or illicit drugs  Current medications and supplements including opioid prescriptions. Patient is not currently taking opioid prescriptions. Functional ability and status Nutritional status Physical activity Advanced directives List of other physicians Hospitalizations, surgeries, and ER visits in previous 12 months Vitals Screenings to include cognitive, depression, and falls Referrals and appointments  In addition, I have reviewed and discussed with patient certain preventive protocols, quality metrics, and best practice recommendations. A written personalized care plan for preventive services as well as general preventive health recommendations were provided to patient.     Tora Kindred, CMA   03/09/2023   After Visit Summary: (Declined) Due to this being a telephonic visit, with patients personalized plan was  offered to patient but patient Declined AVS at this time   Nurse Notes:  6 CIT Score - 10 Placed referral to [facility] for a routine eye exam Placed order for cologuard. Patient declined colonoscopy. Needs pneumonia vaccine Declined covid and shingles vaccines Declined Hepatitis C screening

## 2023-03-09 NOTE — Patient Instructions (Addendum)
Troy [name] , Thank you for taking time to come for your Medicare Wellness Visit. I appreciate your ongoing commitment to your health goals. Please review the following plan we discussed and let me know if I can assist you in the future.   Referrals/Orders/Follow-Ups/Clinician Recommendations: I have placed a referral to have a routine eye exam to [facility] in [place]. Someone should be calling you from their office. I have ordered the cologuard kit to screen for colon cancer. This will come to your home. Complete it and mail back instructions are included. Get another pneumonia shot at your convenience.  This is a list of the screening recommended for you and due dates:  Health Maintenance  Topic Date Due   Zoster (Shingles) Vaccine (1 of 2) Never done   COVID-19 Vaccine (3 - Moderna risk series) 06/12/2019   Pneumonia Vaccine (2 of 2 - PPSV23 or PCV20) 09/20/2023*   Colon Cancer Screening  09/20/2023*   Hepatitis C Screening  09/20/2023*   Medicare Annual Wellness Visit  03/08/2024   DTaP/Tdap/Td vaccine (2 - Td or Tdap) 04/01/2027   Flu Shot  Completed   HPV Vaccine  Aged Out  *Topic was postponed. The date shown is not the original due date.    Advanced directives: (Declined) Advance directive discussed with you today. Even though you declined this today, please call our office should you change your mind, and we can give you the proper paperwork for you to fill out.  Next Medicare Annual Wellness Visit scheduled for next year: Yes, 03/21/24 @ 8:00 am (phone visit)

## 2023-03-21 ENCOUNTER — Other Ambulatory Visit: Payer: Self-pay | Admitting: Family Medicine

## 2023-03-21 DIAGNOSIS — E782 Mixed hyperlipidemia: Secondary | ICD-10-CM

## 2023-03-21 DIAGNOSIS — E890 Postprocedural hypothyroidism: Secondary | ICD-10-CM

## 2023-03-21 DIAGNOSIS — E7801 Familial hypercholesterolemia: Secondary | ICD-10-CM

## 2023-03-21 DIAGNOSIS — I10 Essential (primary) hypertension: Secondary | ICD-10-CM

## 2023-03-21 DIAGNOSIS — K219 Gastro-esophageal reflux disease without esophagitis: Secondary | ICD-10-CM

## 2023-04-19 ENCOUNTER — Other Ambulatory Visit: Payer: Self-pay | Admitting: Family Medicine

## 2023-04-19 DIAGNOSIS — E890 Postprocedural hypothyroidism: Secondary | ICD-10-CM

## 2023-04-19 DIAGNOSIS — E78019 Familial hypercholesterolemia, unspecified: Secondary | ICD-10-CM

## 2023-04-19 DIAGNOSIS — K219 Gastro-esophageal reflux disease without esophagitis: Secondary | ICD-10-CM

## 2023-04-19 DIAGNOSIS — I10 Essential (primary) hypertension: Secondary | ICD-10-CM

## 2023-04-19 DIAGNOSIS — E7801 Familial hypercholesterolemia: Secondary | ICD-10-CM

## 2023-04-19 DIAGNOSIS — E782 Mixed hyperlipidemia: Secondary | ICD-10-CM

## 2023-04-20 ENCOUNTER — Other Ambulatory Visit: Payer: Self-pay | Admitting: Family Medicine

## 2023-04-20 DIAGNOSIS — K219 Gastro-esophageal reflux disease without esophagitis: Secondary | ICD-10-CM

## 2023-04-20 DIAGNOSIS — E890 Postprocedural hypothyroidism: Secondary | ICD-10-CM

## 2023-04-20 DIAGNOSIS — I10 Essential (primary) hypertension: Secondary | ICD-10-CM

## 2023-04-20 DIAGNOSIS — E7801 Familial hypercholesterolemia: Secondary | ICD-10-CM

## 2023-04-20 DIAGNOSIS — N5201 Erectile dysfunction due to arterial insufficiency: Secondary | ICD-10-CM

## 2023-05-13 LAB — COLOGUARD: COLOGUARD: NEGATIVE

## 2023-05-23 ENCOUNTER — Other Ambulatory Visit: Payer: Self-pay | Admitting: Family Medicine

## 2023-05-23 DIAGNOSIS — E782 Mixed hyperlipidemia: Secondary | ICD-10-CM

## 2023-06-18 ENCOUNTER — Other Ambulatory Visit: Payer: Self-pay | Admitting: Family Medicine

## 2023-06-18 DIAGNOSIS — E782 Mixed hyperlipidemia: Secondary | ICD-10-CM

## 2023-06-21 ENCOUNTER — Other Ambulatory Visit: Payer: Self-pay | Admitting: Family Medicine

## 2023-06-21 DIAGNOSIS — E782 Mixed hyperlipidemia: Secondary | ICD-10-CM

## 2023-06-21 DIAGNOSIS — I10 Essential (primary) hypertension: Secondary | ICD-10-CM

## 2023-06-21 DIAGNOSIS — E7801 Familial hypercholesterolemia: Secondary | ICD-10-CM

## 2023-06-21 DIAGNOSIS — K219 Gastro-esophageal reflux disease without esophagitis: Secondary | ICD-10-CM

## 2023-06-21 DIAGNOSIS — N5201 Erectile dysfunction due to arterial insufficiency: Secondary | ICD-10-CM

## 2023-06-21 DIAGNOSIS — E890 Postprocedural hypothyroidism: Secondary | ICD-10-CM

## 2023-07-24 ENCOUNTER — Telehealth: Payer: Self-pay

## 2023-07-24 NOTE — Telephone Encounter (Signed)
 Please schedule pt an appointment.  KP  Copied from CRM [id number]. Topic: Appointments - Scheduling Inquiry for Clinic >> Jul 24, 2023 12:48 PM Ivette P wrote: Reason for CRM: Pt is calling to schedule an appointment, pt said he had an appt for 05/24 but he did not make it.  Attempted to schedule pt with primary Alayne Allis, MD but kept getting restrictions error, pt last seen MD Jones in 10/11/2022.   Pt had AWV in 03/09/2023, pt would like to schedule a physical  Pt would like to schedule with primary, pls call pt to schedule [phone]

## 2023-08-03 ENCOUNTER — Ambulatory Visit: Admitting: Family Medicine

## 2023-08-03 ENCOUNTER — Encounter: Payer: Self-pay | Admitting: Family Medicine

## 2023-08-03 VITALS — BP 130/80 | HR 88 | Ht 67.0 in | Wt 172.0 lb

## 2023-08-03 DIAGNOSIS — Z23 Encounter for immunization: Secondary | ICD-10-CM

## 2023-08-03 DIAGNOSIS — C01 Malignant neoplasm of base of tongue: Secondary | ICD-10-CM

## 2023-08-03 DIAGNOSIS — E890 Postprocedural hypothyroidism: Secondary | ICD-10-CM

## 2023-08-03 DIAGNOSIS — Z131 Encounter for screening for diabetes mellitus: Secondary | ICD-10-CM

## 2023-08-03 DIAGNOSIS — I10 Essential (primary) hypertension: Secondary | ICD-10-CM

## 2023-08-03 DIAGNOSIS — I700 Atherosclerosis of aorta: Secondary | ICD-10-CM | POA: Diagnosis not present

## 2023-08-03 DIAGNOSIS — E782 Mixed hyperlipidemia: Secondary | ICD-10-CM

## 2023-08-03 DIAGNOSIS — Z760 Encounter for issue of repeat prescription: Secondary | ICD-10-CM

## 2023-08-03 DIAGNOSIS — K219 Gastro-esophageal reflux disease without esophagitis: Secondary | ICD-10-CM

## 2023-08-03 DIAGNOSIS — Z79899 Other long term (current) drug therapy: Secondary | ICD-10-CM

## 2023-08-03 DIAGNOSIS — N183 Chronic kidney disease, stage 3 unspecified: Secondary | ICD-10-CM

## 2023-08-03 DIAGNOSIS — Z125 Encounter for screening for malignant neoplasm of prostate: Secondary | ICD-10-CM

## 2023-08-03 MED ORDER — LEVOTHYROXINE SODIUM 125 MCG PO TABS: 125.0000 ug | ORAL_TABLET | Freq: Every day | ORAL | 1 refills | Status: DC

## 2023-08-03 MED ORDER — LORATADINE 10 MG PO TABS: 10.0000 mg | ORAL_TABLET | Freq: Every day | ORAL | 1 refills | Status: AC

## 2023-08-03 MED ORDER — ATORVASTATIN CALCIUM 10 MG PO TABS: 10.0000 mg | ORAL_TABLET | Freq: Every day | ORAL | 1 refills | Status: DC

## 2023-08-03 MED ORDER — AMLODIPINE BESYLATE 10 MG PO TABS: 10.0000 mg | ORAL_TABLET | Freq: Every day | ORAL | 1 refills | Status: DC

## 2023-08-03 MED ORDER — ESOMEPRAZOLE MAGNESIUM 40 MG PO CPDR: 40.0000 mg | DELAYED_RELEASE_CAPSULE | Freq: Every day | ORAL | 1 refills | Status: DC

## 2023-08-03 NOTE — Assessment & Plan Note (Signed)
 Recheck TSH, titrate if needed.

## 2023-08-03 NOTE — Assessment & Plan Note (Signed)
 Fairly controlled with current regimen.  Continue amlodipine  10 mg daily

## 2023-08-03 NOTE — Assessment & Plan Note (Signed)
 Patient takes baby aspirin and statins.

## 2023-08-03 NOTE — Assessment & Plan Note (Signed)
 Follows up with oncology yearly.

## 2023-08-03 NOTE — Assessment & Plan Note (Signed)
 Stable with PPIs.  Continue current medications.

## 2023-08-03 NOTE — Progress Notes (Signed)
 Established Patient Office Visit  Subjective   Patient ID: Troy [name], male    DOB: 05/12/1950  Age: 73 y.o. MRN: [id number]  Chief Complaint  Patient presents with   Establish Care     Assessment & Plan:   Problem List Items Addressed This Visit       Cardiovascular and Mediastinum   Essential hypertension - Primary   Fairly controlled with current regimen.  Continue amlodipine  10 mg daily      Relevant Medications   amLODipine  (NORVASC ) 10 MG tablet   atorvastatin  (LIPITOR) 10 MG tablet   Other Relevant Orders   Comprehensive metabolic panel with GFR   Atherosclerosis of aorta (HCC)   Patient takes baby aspirin and statins.      Relevant Medications   amLODipine  (NORVASC ) 10 MG tablet   atorvastatin  (LIPITOR) 10 MG tablet     Respiratory   Squamous cell carcinoma of base of tongue (HCC)   Follows up with oncology yearly.        Digestive   Gastroesophageal reflux disease   Stable with PPIs.  Continue current medications.      Relevant Medications   esomeprazole  (NEXIUM ) 40 MG capsule     Endocrine   Hypothyroid   Recheck TSH, titrate if needed.      Relevant Medications   levothyroxine  (SYNTHROID ) 125 MCG tablet   Other Relevant Orders   TSH     Genitourinary   Stage 3 chronic kidney disease (HCC)   Relevant Orders   Comprehensive metabolic panel with GFR   VITAMIN D 25 Hydroxy (Vit-D Deficiency, Fractures)   Other Visit Diagnoses       Encounter for long-term current use of medication       Relevant Orders   CBC with Differential/Platelet   Comprehensive metabolic panel with GFR     Diabetes mellitus screening       Relevant Orders   Hemoglobin A1c     Mixed hyperlipidemia       Relevant Medications   amLODipine  (NORVASC ) 10 MG tablet   atorvastatin  (LIPITOR) 10 MG tablet   Other Relevant Orders   Lipid panel     Medication refill       Relevant Medications   amLODipine  (NORVASC ) 10 MG tablet   atorvastatin  (LIPITOR) 10 MG  tablet   levothyroxine  (SYNTHROID ) 125 MCG tablet   esomeprazole  (NEXIUM ) 40 MG capsule   loratadine  (ALLERGY RELIEF) 10 MG tablet     Screening for prostate cancer       Relevant Orders   PSA     Immunization due           Return in about 6 months (around 02/02/2024) for chronic follow up with PCP.   73 year old male with past medical history of squamous cell carcinoma on surveillance, hypertension, hypothyroidism, seasonal allergies, CKD, hyperlipidemia presents to the clinic to establish care with me as his new provider.  Patient has no health related concerns today.  Reports he is doing good overall.  Requesting medication refills.   He is agreeing for shingles vaccine.   He walks daily.   He eats general diet.   Mood is good.  Lives with his sister. Has 2 boys who live in the area.        Review of Systems  All other systems reviewed and are negative.     Objective:     BP 130/80   Pulse 88   Ht 5' 7 (1.702 m)  Wt 172 lb (78 kg)   SpO2 100%   BMI 26.94 kg/m    Physical Exam Vitals and nursing note reviewed.  Constitutional:      Appearance: Normal appearance.  HENT:     Head: Normocephalic.     Right Ear: External ear normal.     Left Ear: External ear normal.   Eyes:     Conjunctiva/sclera: Conjunctivae normal.    Cardiovascular:     Rate and Rhythm: Normal rate.  Pulmonary:     Effort: Pulmonary effort is normal. No respiratory distress.  Abdominal:     Palpations: Abdomen is soft.   Musculoskeletal:        General: Normal range of motion.   Skin:    General: Skin is warm.   Neurological:     Mental Status: He is alert and oriented to person, place, and time.   Psychiatric:        Mood and Affect: Mood normal.      No results found for any visits on 08/03/23.    The 10-year ASCVD risk score (Arnett DK, et al., 2019) is: 18.6%      Oneta Bilberry, MD

## 2023-08-04 ENCOUNTER — Ambulatory Visit: Payer: Self-pay | Admitting: Family Medicine

## 2023-08-04 LAB — COMPREHENSIVE METABOLIC PANEL WITH GFR
ALT: 11 IU/L (ref 0–44)
AST: 16 IU/L (ref 0–40)
Albumin: 4.1 g/dL (ref 3.8–4.8)
Alkaline Phosphatase: 61 IU/L (ref 44–121)
BUN/Creatinine Ratio: 8 — ABNORMAL LOW (ref 10–24)
BUN: 12 mg/dL (ref 8–27)
Bilirubin Total: 0.3 mg/dL (ref 0.0–1.2)
CO2: 23 mmol/L (ref 20–29)
Calcium: 8.9 mg/dL (ref 8.6–10.2)
Chloride: 102 mmol/L (ref 96–106)
Creatinine, Ser: 1.57 mg/dL — ABNORMAL HIGH (ref 0.76–1.27)
Globulin, Total: 2.5 g/dL (ref 1.5–4.5)
Glucose: 108 mg/dL — ABNORMAL HIGH (ref 70–99)
Potassium: 4 mmol/L (ref 3.5–5.2)
Sodium: 140 mmol/L (ref 134–144)
Total Protein: 6.6 g/dL (ref 6.0–8.5)
eGFR: 47 mL/min/{1.73_m2} — ABNORMAL LOW (ref >59)

## 2023-08-04 LAB — CBC WITH DIFFERENTIAL/PLATELET
Basophils Absolute: 0.1 10*3/uL (ref 0.0–0.2)
Basos: 1 %
EOS (ABSOLUTE): 0.2 10*3/uL (ref 0.0–0.4)
Eos: 4 %
Hematocrit: 47.3 % (ref 37.5–51.0)
Hemoglobin: 15.5 g/dL (ref 13.0–17.7)
Immature Grans (Abs): 0.1 10*3/uL (ref 0.0–0.1)
Immature Granulocytes: 1 %
Lymphocytes Absolute: 1.3 10*3/uL (ref 0.7–3.1)
Lymphs: 22 %
MCH: 30.9 pg (ref 26.6–33.0)
MCHC: 32.8 g/dL (ref 31.5–35.7)
MCV: 94 fL (ref 79–97)
Monocytes Absolute: 0.7 10*3/uL (ref 0.1–0.9)
Monocytes: 11 %
Neutrophils Absolute: 3.7 10*3/uL (ref 1.4–7.0)
Neutrophils: 61 %
Platelets: 231 10*3/uL (ref 150–450)
RBC: 5.01 x10E6/uL (ref 4.14–5.80)
RDW: 13.6 % (ref 11.6–15.4)
WBC: 6 10*3/uL (ref 3.4–10.8)

## 2023-08-04 LAB — LIPID PANEL
Chol/HDL Ratio: 3.1 ratio (ref 0.0–5.0)
Cholesterol, Total: 183 mg/dL (ref 100–199)
HDL: 59 mg/dL (ref >39)
LDL Chol Calc (NIH): 112 mg/dL — ABNORMAL HIGH (ref 0–99)
Triglycerides: 66 mg/dL (ref 0–149)
VLDL Cholesterol Cal: 12 mg/dL (ref 5–40)

## 2023-08-04 LAB — PSA: Prostate Specific Ag, Serum: 1.3 ng/mL (ref 0.0–4.0)

## 2023-08-04 LAB — TSH: TSH: 8.62 u[IU]/mL — ABNORMAL HIGH (ref 0.450–4.500)

## 2023-08-04 LAB — VITAMIN D 25 HYDROXY (VIT D DEFICIENCY, FRACTURES): Vit D, 25-Hydroxy: 24.2 ng/mL — ABNORMAL LOW (ref 30.0–100.0)

## 2023-08-04 LAB — HEMOGLOBIN A1C
Est. average glucose Bld gHb Est-mCnc: 123 mg/dL
Hgb A1c MFr Bld: 5.9 % — ABNORMAL HIGH (ref 4.8–5.6)

## 2023-08-15 ENCOUNTER — Telehealth: Payer: Self-pay | Admitting: Family Medicine

## 2023-08-15 DIAGNOSIS — N5201 Erectile dysfunction due to arterial insufficiency: Secondary | ICD-10-CM

## 2023-08-15 NOTE — Telephone Encounter (Unsigned)
 Copied from CRM [id number]. Topic: Clinical - Medication Refill >> Aug 15, 2023  3:11 PM Essie A wrote: Medication:  sildenafil  (VIAGRA ) 100 MG tablet   Has the patient contacted their pharmacy? Yes (Agent: If no, request that the patient contact the pharmacy for the refill. If patient does not wish to contact the pharmacy document the reason why and proceed with request.) (Agent: If yes, when and what did the pharmacy advise?)  This is the patient's preferred pharmacy:  [facility] - [place], [place] - [address] [place] [address] Phone: [phone] Fax: [phone]  Is this the correct pharmacy for this prescription? Yes If no, delete pharmacy and type the correct one.   Has the prescription been filled recently? Yes  Is the patient out of the medication? Yes, for about a month  Has the patient been seen for an appointment in the last year OR does the patient have an upcoming appointment? Yes  Can we respond through MyChart? No  Agent: Please be advised that Rx refills may take up to 3 business days. We ask that you follow-up with your pharmacy.

## 2023-08-17 MED ORDER — SILDENAFIL CITRATE 100 MG PO TABS: 100.0000 mg | ORAL_TABLET | ORAL | 0 refills | Status: AC | PRN

## 2023-08-17 NOTE — Telephone Encounter (Signed)
 Requested medications are due for refill today.  yes  Requested medications are on the active medications list.  yes  Last refill. 06/22/2023 #5 0 rf  Future visit scheduled.   yes  Notes to clinic.  Unable to refill per computer. Please review for refill.    Requested Prescriptions  Pending Prescriptions Disp Refills   sildenafil  (VIAGRA ) 100 MG tablet 5 tablet 0     Urology: Erectile Dysfunction Agents Passed - 08/17/2023  2:02 PM      Passed - AST in normal range and within 360 days    AST  Date Value Ref Range Status  08/03/2023 16 0 - 40 IU/L Final         Passed - ALT in normal range and within 360 days    ALT  Date Value Ref Range Status  08/03/2023 11 0 - 44 IU/L Final         Passed - Last BP in normal range    BP Readings from Last 1 Encounters:  08/03/23 130/80         Passed - Valid encounter within last 12 months    Recent Outpatient Visits           2 weeks ago Essential hypertension   [facility], Vinay K, MD       Future Appointments             In 5 months Kotturi, Vinay K, MD [facility] at [facility], [facility]

## 2023-09-18 ENCOUNTER — Other Ambulatory Visit: Payer: Self-pay

## 2023-09-18 ENCOUNTER — Emergency Department
Admission: EM | Admit: 2023-09-18 | Discharge: 2023-09-18 | Disposition: A | Discharge status: Home / Self Care | Attending: Emergency Medicine | Admitting: Emergency Medicine

## 2023-09-18 ENCOUNTER — Emergency Department

## 2023-09-18 ENCOUNTER — Encounter: Payer: Self-pay | Admitting: Emergency Medicine

## 2023-09-18 DIAGNOSIS — R0781 Pleurodynia: Secondary | ICD-10-CM | POA: Diagnosis not present

## 2023-09-18 DIAGNOSIS — M25552 Pain in left hip: Secondary | ICD-10-CM | POA: Diagnosis not present

## 2023-09-18 DIAGNOSIS — M47812 Spondylosis without myelopathy or radiculopathy, cervical region: Secondary | ICD-10-CM | POA: Insufficient documentation

## 2023-09-18 DIAGNOSIS — M47816 Spondylosis without myelopathy or radiculopathy, lumbar region: Secondary | ICD-10-CM | POA: Insufficient documentation

## 2023-09-18 DIAGNOSIS — Y92410 Unspecified street and highway as the place of occurrence of the external cause: Secondary | ICD-10-CM | POA: Diagnosis not present

## 2023-09-18 DIAGNOSIS — R9082 White matter disease, unspecified: Secondary | ICD-10-CM | POA: Insufficient documentation

## 2023-09-18 DIAGNOSIS — M542 Cervicalgia: Secondary | ICD-10-CM | POA: Diagnosis present

## 2023-09-18 DIAGNOSIS — M545 Low back pain, unspecified: Secondary | ICD-10-CM | POA: Diagnosis not present

## 2023-09-18 DIAGNOSIS — M25512 Pain in left shoulder: Secondary | ICD-10-CM | POA: Insufficient documentation

## 2023-09-18 DIAGNOSIS — M47814 Spondylosis without myelopathy or radiculopathy, thoracic region: Secondary | ICD-10-CM | POA: Insufficient documentation

## 2023-09-18 DIAGNOSIS — S0990XA Unspecified injury of head, initial encounter: Secondary | ICD-10-CM | POA: Diagnosis present

## 2023-09-18 DIAGNOSIS — M25562 Pain in left knee: Secondary | ICD-10-CM | POA: Insufficient documentation

## 2023-09-18 MED ORDER — CYCLOBENZAPRINE HCL 10 MG PO TABS: 10.0000 mg | ORAL_TABLET | Freq: Three times a day (TID) | ORAL | 0 refills | Status: AC | PRN

## 2023-09-18 MED ORDER — HYDROCODONE-ACETAMINOPHEN 5-325 MG PO TABS
1.0000 | ORAL_TABLET | Freq: Once | ORAL | Status: AC
  Administered 2023-09-18: 1 via ORAL
  Filled 2023-09-18: qty 1
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
## 2023-09-18 NOTE — ED Notes (Signed)
 Pt to xray
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
## 2023-09-22 ENCOUNTER — Telehealth: Payer: Self-pay

## 2023-09-22 NOTE — Transitions of Care (Post Inpatient/ED Visit) (Signed)
   09/22/2023  Name: Troy [name] MRN: [id number] DOB: 08-30-1950  Today's TOC FU Call Status: Today's TOC FU Call Status:: Successful TOC FU Call Completed TOC FU Call Complete Date: 09/22/23 Patient's Name and Date of Birth confirmed.  Transition Care Management Follow-up Telephone Call Date of Discharge: 09/18/23 Discharge Facility: [facility] Type of Discharge: Emergency Department Reason for ED Visit: Other: (Cervicalgia) How have you been since you were released from the hospital?: Better Any questions or concerns?: No  Items Reviewed: Did you receive and understand the discharge instructions provided?: Yes Medications obtained,verified, and reconciled?: Yes (Medications Reviewed) Any new allergies since your discharge?: No Dietary orders reviewed?: NA Do you have support at home?: No People in Home [RPT]: alone  Medications Reviewed Today: Medications Reviewed Today     Reviewed by Camacho Ocampo, Kazmir Oki M, CMA (Certified Medical Assistant) on 09/22/23 at 1612  Med List Status: <None>   Medication Order Taking? Sig Documenting Provider Last Dose Status Informant  acetaminophen  (TYLENOL ) 650 MG CR tablet [id number] Yes Take 1,300 mg by mouth every 8 (eight) hours. [provider]  Active   amLODipine  (NORVASC ) 10 MG tablet [id number] Yes Take 1 tablet (10 mg total) by mouth daily. Kotturi, Vinay K, MD  Active   aspirin EC 81 MG tablet [id number] Yes Take 81 mg by mouth daily. [provider]  Active   atorvastatin  (LIPITOR) 10 MG tablet [id number] Yes Take 1 tablet (10 mg total) by mouth daily. Kotturi, Vinay K, MD  Active   cyclobenzaprine  (FLEXERIL ) 10 MG tablet [id number] Yes Take 1 tablet (10 mg total) by mouth 3 (three) times daily as needed for muscle spasms. Levander Slate, MD  Active   esomeprazole  (NEXIUM ) 40 MG capsule [id number] Yes Take 1 capsule (40 mg total) by mouth daily. Kotturi, Vinay K, MD  Active   levothyroxine   (SYNTHROID ) 125 MCG tablet [id number] Yes Take 1 tablet (125 mcg total) by mouth daily. Kotturi, Vinay K, MD  Active   loratadine  (ALLERGY RELIEF) 10 MG tablet [id number] Yes Take 1 tablet (10 mg total) by mouth daily. Kotturi, Vinay K, MD  Active   sildenafil  (VIAGRA ) 100 MG tablet [id number] Yes Take 1 tablet (100 mg total) by mouth as needed for erectile dysfunction. Kotturi, Vinay K, MD  Active             Home Care and Equipment/Supplies: Were Home Health Services Ordered?: NA Any new equipment or medical supplies ordered?: NA  Functional Questionnaire: Do you need assistance with bathing/showering or dressing?: No Do you need assistance with meal preparation?: No Do you need assistance with eating?: No Do you have difficulty maintaining continence: No Do you need assistance with getting out of bed/getting out of a chair/moving?: No Do you have difficulty managing or taking your medications?: No  Follow up appointments reviewed: PCP Follow-up appointment confirmed?: Yes MD Provider Line Number:[phone] Given: No Date of PCP follow-up appointment?: 09/25/23 Follow-up Provider: [facility] Follow-up appointment confirmed?: NA Do you need transportation to your follow-up appointment?: No Do you understand care options if your condition(s) worsen?: Yes-patient verbalized understanding    Linetta Regner Kristie Harsh, CMA  [facility] at [facility] [address] [place], [place] [address] Office: [phone] Fax: [phone]

## 2023-09-25 ENCOUNTER — Encounter: Payer: Self-pay | Admitting: Family Medicine

## 2023-09-25 ENCOUNTER — Ambulatory Visit (INDEPENDENT_AMBULATORY_CARE_PROVIDER_SITE_OTHER): Admitting: Family Medicine

## 2023-09-25 VITALS — BP 135/85 | HR 105 | Ht 67.0 in | Wt 174.2 lb

## 2023-09-25 DIAGNOSIS — I10 Essential (primary) hypertension: Secondary | ICD-10-CM

## 2023-09-25 DIAGNOSIS — K5900 Constipation, unspecified: Secondary | ICD-10-CM | POA: Diagnosis not present

## 2023-09-25 DIAGNOSIS — Z23 Encounter for immunization: Secondary | ICD-10-CM | POA: Diagnosis not present

## 2023-09-25 DIAGNOSIS — M47816 Spondylosis without myelopathy or radiculopathy, lumbar region: Secondary | ICD-10-CM | POA: Diagnosis not present

## 2023-09-25 DIAGNOSIS — N1832 Chronic kidney disease, stage 3b: Secondary | ICD-10-CM

## 2023-09-25 MED ORDER — SENNA-DOCUSATE SODIUM 8.6-50 MG PO TABS: 1.0000 | ORAL_TABLET | Freq: Every day | ORAL | 1 refills | Status: AC

## 2023-09-25 MED ORDER — TRAMADOL HCL 50 MG PO TABS: 50.0000 mg | ORAL_TABLET | Freq: Three times a day (TID) | ORAL | 0 refills | Status: AC | PRN
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
## 2023-09-26 NOTE — Addendum Note (Signed)
 Addended by: SOL MACKEY VON MARLA on: 09/26/2023 01:31 PM   Modules accepted: Level of Service

## 2023-10-03 ENCOUNTER — Ambulatory Visit (INDEPENDENT_AMBULATORY_CARE_PROVIDER_SITE_OTHER)

## 2023-10-03 DIAGNOSIS — Z23 Encounter for immunization: Secondary | ICD-10-CM | POA: Diagnosis not present

## 2023-10-03 NOTE — Progress Notes (Signed)
 Patient is in office today for a nurse visit for Immunization. Patient Injection was given in the  Left deltoid. Patient tolerated injection well.

## 2023-10-12 ENCOUNTER — Other Ambulatory Visit: Payer: Self-pay | Admitting: *Deleted

## 2023-10-12 DIAGNOSIS — Z8589 Personal history of malignant neoplasm of other organs and systems: Secondary | ICD-10-CM

## 2023-10-12 DIAGNOSIS — C01 Malignant neoplasm of base of tongue: Secondary | ICD-10-CM

## 2023-10-13 ENCOUNTER — Telehealth: Payer: Self-pay

## 2023-10-13 ENCOUNTER — Inpatient Hospital Stay: Payer: Medicare HMO | Attending: Nurse Practitioner

## 2023-10-13 ENCOUNTER — Encounter: Payer: Self-pay | Admitting: Nurse Practitioner

## 2023-10-13 ENCOUNTER — Inpatient Hospital Stay: Payer: Medicare HMO | Admitting: Nurse Practitioner

## 2023-10-13 VITALS — BP 136/75 | HR 88 | Temp 97.3°F | Resp 17 | Wt 174.0 lb

## 2023-10-13 DIAGNOSIS — Z08 Encounter for follow-up examination after completed treatment for malignant neoplasm: Secondary | ICD-10-CM

## 2023-10-13 DIAGNOSIS — Z809 Family history of malignant neoplasm, unspecified: Secondary | ICD-10-CM | POA: Diagnosis not present

## 2023-10-13 DIAGNOSIS — Z85810 Personal history of malignant neoplasm of tongue: Secondary | ICD-10-CM | POA: Insufficient documentation

## 2023-10-13 DIAGNOSIS — Z8589 Personal history of malignant neoplasm of other organs and systems: Secondary | ICD-10-CM

## 2023-10-13 DIAGNOSIS — Z9221 Personal history of antineoplastic chemotherapy: Secondary | ICD-10-CM | POA: Insufficient documentation

## 2023-10-13 DIAGNOSIS — D72829 Elevated white blood cell count, unspecified: Secondary | ICD-10-CM | POA: Insufficient documentation

## 2023-10-13 DIAGNOSIS — E039 Hypothyroidism, unspecified: Secondary | ICD-10-CM | POA: Insufficient documentation

## 2023-10-13 DIAGNOSIS — C01 Malignant neoplasm of base of tongue: Secondary | ICD-10-CM

## 2023-10-13 DIAGNOSIS — N1832 Chronic kidney disease, stage 3b: Secondary | ICD-10-CM | POA: Insufficient documentation

## 2023-10-13 DIAGNOSIS — Z923 Personal history of irradiation: Secondary | ICD-10-CM | POA: Diagnosis not present

## 2023-10-13 LAB — CBC WITH DIFFERENTIAL (CANCER CENTER ONLY)
Abs Immature Granulocytes: 0.03 K/uL (ref 0.00–0.07)
Basophils Absolute: 0.1 K/uL (ref 0.0–0.1)
Basophils Relative: 1 %
Eosinophils Absolute: 0.2 K/uL (ref 0.0–0.5)
Eosinophils Relative: 3 %
HCT: 42.5 % (ref 39.0–52.0)
Hemoglobin: 14.5 g/dL (ref 13.0–17.0)
Immature Granulocytes: 1 %
Lymphocytes Relative: 23 %
Lymphs Abs: 1.5 K/uL (ref 0.7–4.0)
MCH: 31.9 pg (ref 26.0–34.0)
MCHC: 34.1 g/dL (ref 30.0–36.0)
MCV: 93.4 fL (ref 80.0–100.0)
Monocytes Absolute: 0.6 K/uL (ref 0.1–1.0)
Monocytes Relative: 10 %
Neutro Abs: 4 K/uL (ref 1.7–7.7)
Neutrophils Relative %: 62 %
Platelet Count: 221 K/uL (ref 150–400)
RBC: 4.55 MIL/uL (ref 4.22–5.81)
RDW: 12.6 % (ref 11.5–15.5)
WBC Count: 6.4 K/uL (ref 4.0–10.5)
nRBC: 0 % (ref 0.0–0.2)

## 2023-10-13 LAB — CMP (CANCER CENTER ONLY)
ALT: 13 U/L (ref 0–44)
AST: 18 U/L (ref 15–41)
Albumin: 3.6 g/dL (ref 3.5–5.0)
Alkaline Phosphatase: 48 U/L (ref 38–126)
Anion gap: 6 (ref 5–15)
BUN: 15 mg/dL (ref 8–23)
CO2: 26 mmol/L (ref 22–32)
Calcium: 9 mg/dL (ref 8.9–10.3)
Chloride: 106 mmol/L (ref 98–111)
Creatinine: 1.74 mg/dL — ABNORMAL HIGH (ref 0.61–1.24)
GFR, Estimated: 41 mL/min — ABNORMAL LOW (ref >60)
Glucose, Bld: 130 mg/dL — ABNORMAL HIGH (ref 70–99)
Potassium: 3.7 mmol/L (ref 3.5–5.1)
Sodium: 138 mmol/L (ref 135–145)
Total Bilirubin: 0.7 mg/dL (ref 0.0–1.2)
Total Protein: 6.6 g/dL (ref 6.5–8.1)

## 2023-10-13 LAB — TSH: TSH: 2.373 u[IU]/mL (ref 0.350–4.500)
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
## 2023-10-13 NOTE — Progress Notes (Signed)
 [facility] at [facility], [place] Phone: [phone]  Progress Note  Clinic Day:  10/13/2023  Referring physician: Joshua Cathryne BROCKS, MD/Dr Kotturi  Chief Complaint: Troy [name] is a 73 y.o. 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 male with history of recurrent SCC of tongue who returns to clinic for continued surveillance. He reports feeling well. No unintentional weight loss, difficulty or painful swallowing. No new lumps or bumps. Denies new oral lesions. He has not seen ENT since 2022. Is awaiting new dentures.   Review of Systems  Constitutional:  Negative for chills, fever, malaise/fatigue and weight loss.  HENT:  Negative for hearing loss, nosebleeds, sore throat and tinnitus.   Eyes:  Negative for blurred vision and double vision.  Respiratory:  Negative for cough, hemoptysis, shortness of breath and wheezing.   Cardiovascular:  Negative for chest pain, palpitations and leg swelling.  Gastrointestinal:  Negative for abdominal pain, blood in stool, constipation, diarrhea, melena, nausea and vomiting.  Genitourinary:  Negative for dysuria and urgency.  Musculoskeletal:  Negative for back pain, falls, joint pain and myalgias.  Skin:  Negative for itching and rash.  Neurological:  Negative for dizziness, tingling, sensory change, loss of consciousness, weakness and headaches.  Endo/Heme/Allergies:  Negative for environmental allergies. Does not bruise/bleed easily.  Psychiatric/Behavioral:  Negative for depression. The patient is not nervous/anxious and does not have insomnia.    Past Medical History:  Diagnosis Date   Cancer (HCC)    GERD (gastroesophageal reflux disease)    Hypertension    Thyroid  disease    Past Surgical History:  Procedure Laterality Date   EXCISION OF TONGUE LESION Bilateral 02/27/2020   Procedure: EXCISION OF TONGUE AND MOUTH  LESION;  Surgeon: Juengel, Paul, MD;  Location: [facility];  Service: ENT;  Laterality: Bilateral;   tumor removed     throat    Family History  Problem Relation Age of Onset   Cancer Mother    Stroke Father    Hypertension Father    Cancer Sister    Cancer Maternal Aunt    Cancer Paternal Uncle     Social History:  reports that he has never smoked. He has never used smokeless tobacco. He reports current alcohol use of about 2.0 standard drinks of alcohol per week. He reports that he does not use drugs.    Allergies:  Allergies  Allergen Reactions   Ace Inhibitors Cough and Other (See Comments)    Current Medications: Current Outpatient Medications  Medication Sig Dispense Refill   acetaminophen  (TYLENOL ) 650 MG CR tablet Take 1,300 mg by mouth every 8 (eight) hours.     amLODipine  (NORVASC ) 10 MG tablet Take 1 tablet (10 mg total) by mouth daily. 90 tablet 1   aspirin EC 81 MG tablet Take 81 mg by mouth daily.     atorvastatin  (LIPITOR) 10 MG tablet Take 1 tablet (10 mg total) by mouth daily. 90 tablet 1   cyclobenzaprine  (FLEXERIL ) 10 MG tablet Take 1 tablet (10 mg total) by mouth 3 (three) times daily as needed for muscle spasms. 20 tablet 0   esomeprazole  (NEXIUM ) 40 MG capsule Take 1 capsule (40 mg total) by mouth daily. 90 capsule 1   levothyroxine  (SYNTHROID ) 125 MCG tablet Take 1 tablet (125 mcg total) by mouth daily. 90 tablet 1   loratadine  (ALLERGY RELIEF) 10 MG  tablet Take 1 tablet (10 mg total) by mouth daily. 90 tablet 1   sennosides-docusate sodium  (SENOKOT-S) 8.6-50 MG tablet Take 1 tablet by mouth daily. 30 tablet 1   sildenafil  (VIAGRA ) 100 MG tablet Take 1 tablet (100 mg total) by mouth as needed for erectile dysfunction. 5 tablet 0   methylPREDNISolone (MEDROL DOSEPAK) 4 MG TBPK tablet Take 4 mg by mouth. (Patient not taking: Reported on 10/13/2023)     No current facility-administered medications for this visit.    Performance status (ECOG):   0  Vitals  Blood pressure 136/75, pulse 88, temperature (!) 97.3 F (36.3 C), temperature source Tympanic, resp. rate 17, weight 174 lb (78.9 kg), SpO2 100%.  Physical Exam Vitals reviewed.  Constitutional:      Appearance: He is not ill-appearing.  HENT:     Right Ear: External ear normal.     Left Ear: External ear normal.     Nose: Nose normal.     Mouth/Throat:     Mouth: Mucous membranes are moist.     Pharynx: Oropharynx is clear. No oropharyngeal exudate or posterior oropharyngeal erythema.  Cardiovascular:     Rate and Rhythm: Normal rate and regular rhythm.  Pulmonary:     Effort: No respiratory distress.  Abdominal:     General: There is no distension.     Tenderness: There is no abdominal tenderness.  Musculoskeletal:     Cervical back: No tenderness.     Comments: Ambulates w/o aids  Lymphadenopathy:     Cervical: No cervical adenopathy.  Skin:    Coloration: Skin is not pale.     Findings: No bruising.  Neurological:     Mental Status: He is alert and oriented to person, place, and time.  Psychiatric:        Mood and Affect: Mood normal.        Behavior: Behavior normal.        Latest Ref Rng & Units 10/13/2023   10:43 AM 08/03/2023   10:05 AM 10/13/2022    9:17 AM  CBC  WBC 4.0 - 10.5 K/uL 6.4  6.0  12.5   Hemoglobin 13.0 - 17.0 g/dL 85.4  84.4  85.1   Hematocrit 39.0 - 52.0 % 42.5  47.3  43.9   Platelets 150 - 400 K/uL 221  231  196       Latest Ref Rng & Units 10/13/2023   10:43 AM 08/03/2023   10:05 AM 10/13/2022    9:17 AM  CMP  Glucose 70 - 99 mg/dL 869  891  887   BUN 8 - 23 mg/dL 15  12  18    Creatinine 0.61 - 1.24 mg/dL 8.25  8.42  8.70   Sodium 135 - 145 mmol/L 138  140  139   Potassium 3.5 - 5.1 mmol/L 3.7  4.0  3.8   Chloride 98 - 111 mmol/L 106  102  105   CO2 22 - 32 mmol/L 26  23  26    Calcium  8.9 - 10.3 mg/dL 9.0  8.9  9.0   Total Protein 6.5 - 8.1 g/dL 6.6  6.6  7.2   Total Bilirubin 0.0 - 1.2 mg/dL 0.7  0.3  0.4   Alkaline  Phos 38 - 126 U/L 48  61  46   AST 15 - 41 U/L 18  16  23    ALT 0 - 44 U/L 13  11  18     Lab Results  Component Value Date  TSH 8.620 (H) 08/03/2023   TSH 0.432 10/13/2022   TSH 2.230 07/18/2022   TSH 2.211 02/21/2022   TSH 3.510 12/30/2021   TSH 6.290 (H) 07/06/2021   TSH 12.652 (H) 01/29/2021   TSH 0.389 07/28/2020   TSH 0.174 (L) 07/06/2020   TSH 0.324 (L) 01/13/2020   TSH 0.391 07/08/2019   TSH 1.067 04/08/2019   TSH 0.402 01/03/2019   TSH 0.486 10/26/2018   TSH 0.617 10/05/2018   TSH 0.320 (L) 09/04/2018   TSH 0.569 07/10/2018   TSH 0.362 06/08/2018   TSH 1.290 05/18/2018   TSH 0.458 04/06/2018   TSH 0.259 (L) 02/16/2018   TSH 4.230 12/29/2017   TSH 0.600 12/08/2017   TSH 0.757 11/17/2017   TSH 1.350 10/27/2017   TSH 8.210 (H) 10/06/2017   TSH 1.900 09/15/2017   TSH 0.444 (L) 08/25/2017   TSH 4.680 (H) 07/28/2017   TSH 0.429 (L) 07/07/2017   TSH 1.880 06/16/2017   TSH 0.618 05/05/2017   TSH 1.510 04/14/2017   TSH 5.170 (H) 03/31/2017   09/18/23- CT head WO Contrast EXAM: CT HEAD WITHOUT CONTRAST   TECHNIQUE: Contiguous axial images were obtained from the base of the skull through the vertex without intravenous contrast.   RADIATION DOSE REDUCTION: This exam was performed according to the departmental dose-optimization program which includes automated exposure control, adjustment of the mA and/or kV according to patient size and/or use of iterative reconstruction technique.   COMPARISON:  None Available.   FINDINGS: Brain: Cerebral volume is within normal limits for age. No midline shift, ventriculomegaly, mass effect, evidence of mass lesion, intracranial hemorrhage or evidence of cortically based acute infarction. Patchy mild to moderate for age bilateral cerebral white matter hypodensity, slightly greater in the left hemisphere. Otherwise preserved gray-white differentiation.   Vascular: Calcified atherosclerosis at the skull base. No  suspicious intracranial vascular hyperdensity.   Skull: Intact.  Negative.   Sinuses/Orbits: Visualized paranasal sinuses and mastoids are clear.   Other: Visualized orbits and scalp soft tissues are within normal limits.   IMPRESSION: 1. No acute intracranial abnormality or acute traumatic injury identified. 2. Mild to moderate for age white matter changes, most commonly due to small vessel disease.  09/18/23- CT Cervical Spine WO Contrast EXAM: CT CERVICAL SPINE WITHOUT CONTRAST   TECHNIQUE: Multidetector CT imaging of the cervical spine was performed without intravenous contrast. Multiplanar CT image reconstructions were also generated.   RADIATION DOSE REDUCTION: This exam was performed according to the departmental dose-optimization program which includes automated exposure control, adjustment of the mA and/or kV according to patient size and/or use of iterative reconstruction technique.   COMPARISON:  Head CT today.  Neck CT 02/25/2022.   FINDINGS: Alignment: Chronic straightening of cervical lordosis, mildly progressed compared to last year. Cervicothoracic junction alignment is within normal limits. Bilateral posterior element alignment is within normal limits. Mild chronic degenerative retrolisthesis of C3 on C4.   Skull base and vertebrae: Bone mineralization is within normal limits for age. Visualized skull base is intact. No atlanto-occipital dissociation. C1 and C2 appear intact and aligned. No acute osseous abnormality identified.   Soft tissues and spinal canal: No prevertebral fluid or swelling. No visible canal hematoma. Visible noncontrast neck soft tissues appears stable from the restaging CT last year, previous right neck dissection.   Disc levels: Chronic severe cervical spine degeneration, especially at C3-C4 where there is evidence of chronically developing degenerative interbody ankylosis due to complete loss of the disc space. Severe adjacent  segment  facet arthropathy at C2-C3 on the left.   Upper chest: Visible upper thoracic levels appear intact. Stable lung apices.   IMPRESSION: 1. No acute traumatic injury identified in the cervical spine. 2. Chronic severe cervical spine degeneration, stable compared to last year. 3. Stable chronic post treatment appearance of the visible Neck.    Assessment: Patient is 73 year old male who returns to clinic for follow up for   Recurrent Squamous Cell Carcinoma of Base of Tongue- Previously followed by Dr. Jacobo and Dr. Rudell. Initially diagnosed with squamous cell carcinoma of the base of tongue [id number] 11/2014, s/p concurrent cisplatin-radiation completed 05/21/2015, p16+. PET was reported as equivocal. Repeat pet 07/05/16 revealed increasing level 2 LN consistent with recurrent disease. Inoperable. Received palliative keytruda  q3w from 10/2016 - 10/2018 (portion of tx received while incarcerated). PET 09/2018 reported new focus in left mandibular condyle but no correlate on follow up MRI. He has been NED since. He had imaging as part of work up for [facility] and underwent CT Spine and head in ER on 09/18/23 that did not reveal any evidence of recurrent disease. He has not seen ENT since post op in 2022. Clinically doing well. He declines ENT f/u and based on p16 positivity I suspect he has been cured of his cancer. No role for routine imaging but if he becomes symptomatic, would recommend. We reviewed symptoms that would be concerning for recurrence and would warrant sooner return. Return to clinic in 1 year for surveillance and we can consider releasing him to his PCP for surveillance at that time.  Carotid artery stenosis screening- H&N RT treatment is associated with development of carotid artery stenosis and RT dose to the carotid artery is associated with increased stroke risk. The Society for Vascular surgery recommends initial carotid imaging surveillance within 2 years following completion of RT  then screening every 3 years. This recommendation acknowledges the high risk of atherosclerotic disease in patients who have undergone neck irradiation thus justifying screening in asymptomatic patients. He has never had carotid imaging and therefore, we will order that today. He can then be released from follow up at the Cancer Center to his pcp for ongoing monitoring.  CKD- stage 3b. Followed by nephrology but hasn't seen Dr Marcelino in some time. Renal function has again worsened. Refer back to Dr Marcelino.  Hypothyroidism- related to treatment for cancer. Managed by PCP. TSH in June was 8.62. Follow up with Dr Sol for management and recommended compliance.   Erythrocytosis- Previously workup included JAK2, CO, EPO, and testosterone  which were normal. Hemoglobin is currently normal. Recommend monitoring.  Oral lesions- excised by Dr Juengel 02/27/20. Pathology was reported as benign.  Leukocytosis- thought to be post covid. WBC normal today.  Poor dentition- hx of radiation contributing. He is awaiting new dentures.   Disposition: Carotid ultrasound 1 year- see me for surveillance- la   I discussed the assessment and treatment plan with the patient. The patient was provided an opportunity to ask questions and all were answered. The patient agreed with the plan and demonstrated an understanding of the instructions.   The patient was advised to call back or seek an in-person evaluation if the symptoms worsen or if the condition fails to improve as anticipated.    Tinnie Dawn, DNP, AGNP-C [facility]  CC: Dr Sol

## 2023-10-13 NOTE — Telephone Encounter (Signed)
 Called to inquire on mutual patient per [facility] patient cancelled last apt after post op visit in 2022. Patient would need a new referral to be seen again.

## 2023-10-19 ENCOUNTER — Ambulatory Visit

## 2023-10-26 ENCOUNTER — Ambulatory Visit: Admitting: Family Medicine

## 2023-10-26 ENCOUNTER — Encounter: Payer: Self-pay | Admitting: Family Medicine

## 2023-11-03 ENCOUNTER — Ambulatory Visit

## 2023-11-06 ENCOUNTER — Other Ambulatory Visit: Payer: Self-pay | Admitting: Family Medicine

## 2023-11-06 ENCOUNTER — Encounter: Payer: Self-pay | Admitting: Family Medicine

## 2023-11-06 ENCOUNTER — Ambulatory Visit (INDEPENDENT_AMBULATORY_CARE_PROVIDER_SITE_OTHER): Admitting: Family Medicine

## 2023-11-06 VITALS — BP 124/78 | HR 84 | Ht 67.0 in | Wt 171.0 lb

## 2023-11-06 DIAGNOSIS — K219 Gastro-esophageal reflux disease without esophagitis: Secondary | ICD-10-CM

## 2023-11-06 DIAGNOSIS — G8929 Other chronic pain: Secondary | ICD-10-CM | POA: Diagnosis not present

## 2023-11-06 DIAGNOSIS — M25511 Pain in right shoulder: Secondary | ICD-10-CM

## 2023-11-06 DIAGNOSIS — E890 Postprocedural hypothyroidism: Secondary | ICD-10-CM

## 2023-11-06 DIAGNOSIS — Z760 Encounter for issue of repeat prescription: Secondary | ICD-10-CM

## 2023-11-06 DIAGNOSIS — I10 Essential (primary) hypertension: Secondary | ICD-10-CM

## 2023-11-06 MED ORDER — TRAMADOL HCL 50 MG PO TABS: 50.0000 mg | ORAL_TABLET | Freq: Three times a day (TID) | ORAL | 0 refills | Status: AC | PRN
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
## 2023-11-07 NOTE — Telephone Encounter (Signed)
 Requested by interface surescripts. Future visit in 2 months. Requested Prescriptions  Pending Prescriptions Disp Refills   amLODipine  (NORVASC ) 10 MG tablet [Pharmacy Med Name: AMLODIPINE  BESYLATE 10 MG TAB] 90 tablet 0    Sig: Take 1 tablet (10 mg total) by mouth daily.     Cardiovascular: Calcium  Channel Blockers 2 Passed - 11/07/2023  1:36 PM      Passed - Last BP in normal range    BP Readings from Last 1 Encounters:  11/06/23 124/78         Passed - Last Heart Rate in normal range    Pulse Readings from Last 1 Encounters:  11/06/23 84         Passed - Valid encounter within last 6 months    Recent Outpatient Visits           Yesterday Chronic right shoulder pain   [place] [facility] Kotturi, Vinay K, MD   1 month ago Essential hypertension   [place] [facility] Kotturi, Vinay K, MD   3 months ago Essential hypertension   [place] [facility] at [facility], Vinay K, MD       Future Appointments             In 2 months Kotturi, Vinay K, MD [facility] at [facility], [phone] [place]             esomeprazole  (NEXIUM ) 40 MG capsule [Pharmacy Med Name: ESOMEPRAZOLE  MAG DR 40 MG CAP] 90 capsule 0    Sig: Take 1 capsule (40 mg total) by mouth daily.     Gastroenterology: Proton Pump Inhibitors 2 Passed - 11/07/2023  1:36 PM      Passed - ALT in normal range and within 360 days    ALT  Date Value Ref Range Status  10/13/2023 13 0 - 44 U/L Final         Passed - AST in normal range and within 360 days    AST  Date Value Ref Range Status  10/13/2023 18 15 - 41 U/L Final         Passed - Valid encounter within last 12 months    Recent Outpatient Visits           Yesterday Chronic right shoulder pain   [place] [facility] Kotturi, Vinay K, MD   1 month ago Essential  hypertension   [place] [facility] at [facility] Kotturi, Vinay K, MD   3 months ago Essential hypertension   [place] [facility] at [facility], Vinay K, MD       Future Appointments             In 2 months Kotturi, Vinay K, MD [facility] at [facility], [phone] [place]             levothyroxine  (SYNTHROID ) 125 MCG tablet [Pharmacy Med Name: LEVOTHYROXINE  125 MCG TABLET] 90 tablet 0    Sig: Take 1 tablet (125 mcg total) by mouth daily.     Endocrinology:  Hypothyroid Agents Passed - 11/07/2023  1:36 PM      Passed - TSH in normal range and within 360 days    TSH  Date Value Ref Range Status  10/13/2023 2.373 0.350 -  4.500 uIU/mL Final    Comment:    Performed by a 3rd Generation assay with a functional sensitivity of <=0.01 uIU/mL. Performed at [facility], [address] [place], [place] [address]   08/03/2023 8.620 (H) 0.450 - 4.500 uIU/mL Final         Passed - Valid encounter within last 12 months    Recent Outpatient Visits           Yesterday Chronic right shoulder pain   [place] [facility] at [facility], Vinay K, MD   1 month ago Essential hypertension   [place] [facility] at [facility], Vinay K, MD   3 months ago Essential hypertension   [facility] at [facility] Vinay K, MD       Future Appointments             In 2 months Kotturi, Vinay K, MD [facility] at [facility], [phone] [place]

## 2023-11-15 DIAGNOSIS — M542 Cervicalgia: Secondary | ICD-10-CM | POA: Diagnosis not present

## 2023-11-15 DIAGNOSIS — M5451 Vertebrogenic low back pain: Secondary | ICD-10-CM | POA: Diagnosis not present

## 2023-11-22 DIAGNOSIS — M5451 Vertebrogenic low back pain: Secondary | ICD-10-CM | POA: Diagnosis not present

## 2023-11-22 DIAGNOSIS — M542 Cervicalgia: Secondary | ICD-10-CM | POA: Diagnosis not present

## 2023-12-01 DIAGNOSIS — M5416 Radiculopathy, lumbar region: Secondary | ICD-10-CM | POA: Diagnosis not present

## 2023-12-29 DIAGNOSIS — F40240 Claustrophobia: Secondary | ICD-10-CM | POA: Diagnosis not present

## 2023-12-29 DIAGNOSIS — M5412 Radiculopathy, cervical region: Secondary | ICD-10-CM | POA: Diagnosis not present

## 2024-01-03 DIAGNOSIS — M5412 Radiculopathy, cervical region: Secondary | ICD-10-CM | POA: Diagnosis not present

## 2024-02-02 ENCOUNTER — Encounter: Payer: Self-pay | Admitting: Family Medicine

## 2024-02-02 ENCOUNTER — Ambulatory Visit: Admitting: Family Medicine

## 2024-02-02 VITALS — BP 132/90 | HR 96 | Ht 66.0 in | Wt 172.0 lb

## 2024-02-02 DIAGNOSIS — E890 Postprocedural hypothyroidism: Secondary | ICD-10-CM | POA: Diagnosis not present

## 2024-02-02 DIAGNOSIS — J069 Acute upper respiratory infection, unspecified: Secondary | ICD-10-CM | POA: Diagnosis not present

## 2024-02-02 DIAGNOSIS — K529 Noninfective gastroenteritis and colitis, unspecified: Secondary | ICD-10-CM | POA: Diagnosis not present

## 2024-02-02 DIAGNOSIS — Z760 Encounter for issue of repeat prescription: Secondary | ICD-10-CM | POA: Diagnosis not present

## 2024-02-02 DIAGNOSIS — E782 Mixed hyperlipidemia: Secondary | ICD-10-CM

## 2024-02-02 DIAGNOSIS — I10 Essential (primary) hypertension: Secondary | ICD-10-CM

## 2024-02-02 MED ORDER — ATORVASTATIN CALCIUM 10 MG PO TABS: 10.0000 mg | ORAL_TABLET | Freq: Every day | ORAL | 1 refills | Status: AC

## 2024-02-02 MED ORDER — LEVOTHYROXINE SODIUM 125 MCG PO TABS: 125.0000 ug | ORAL_TABLET | Freq: Every day | ORAL | 0 refills | Status: AC

## 2024-02-02 MED ORDER — AMLODIPINE BESYLATE 10 MG PO TABS: 10.0000 mg | ORAL_TABLET | Freq: Every day | ORAL | 1 refills | Status: AC

## 2024-02-02 NOTE — Progress Notes (Signed)
 "  Established Patient Office Visit  Patient ID: Troy [name], male    DOB: 1950/05/18  Age: 73 y.o. MRN: [id number] PCP: Renada Cronin K, MD  Chief Complaint  Patient presents with   Follow-up    Subjective:     HPI  Discussed the use of AI scribe software for clinical note transcription with the patient, who gave verbal consent to proceed.  History of Present Illness Troy [name] is a 73 year old male who presents with cold symptoms and stomach cramps.  He has been experiencing cold symptoms for five days, primarily a stuffy nose that worsens at night. He has been using dayquil, but it has not provided relief. No fever, headaches are present.  Stomach cramps began yesterday, accompanied by diarrhea, although diarrhea was absent today. He has not consumed any unusual foods recently. Due to an upset stomach, he did not take his medications today.  He reports that he generally takes his medications every day, but did not take them today due to an upset stomach. He is scheduled for back injections later today.     Review of Systems  All other systems reviewed and are negative.     Objective:     BP (!) 132/90   Pulse 96   Ht 5' 6 (1.676 m)   Wt 172 lb (78 kg)   SpO2 98%   BMI 27.76 kg/m  BP Readings from Last 3 Encounters:  02/02/24 (!) 132/90  11/06/23 124/78  10/13/23 136/75      Physical Exam Vitals and nursing note reviewed.  Constitutional:      Appearance: Normal appearance.  HENT:     Head: Normocephalic.     Right Ear: External ear normal.     Left Ear: External ear normal.  Eyes:     Conjunctiva/sclera: Conjunctivae normal.  Cardiovascular:     Rate and Rhythm: Normal rate.  Pulmonary:     Effort: Pulmonary effort is normal. No respiratory distress.  Abdominal:     Palpations: Abdomen is soft.  Musculoskeletal:        General: Normal range of motion.  Skin:    General: Skin is warm.  Neurological:     Mental Status: He is  alert and oriented to person, place, and time.  Psychiatric:        Mood and Affect: Mood normal.     Physical Exam     No results found for any visits on 02/02/24.     The 10-year ASCVD risk score (Arnett DK, et al., 2019) is: 20.5%    Assessment & Plan:   Problem List Items Addressed This Visit   None   Assessment and Plan Assessment & Plan Acute upper respiratory infection Nasal congestion at night and stomach cramps for five days. No headache or fever. - Use Flonase nasal spray. - Take Tylenol  for symptom relief. - Return if fever, headache, shortness of breath, or productive cough develops.  Acute gastroenteritis Stomach cramps and diarrhea since yesterday, likely dietary indiscretion. - Eat bland foods, avoid fatty or heavy meals. - Use Pepto Bismol for cramps. - Return if symptoms worsen.  Essential hypertension Blood pressure medication running low, missed dose today due to stomach upset. - Refilled blood pressure medication at [facility]. - Advised regular intake of medication.  Mixed hyperlipidemia Cholesterol medication running low. - Refilled cholesterol medication.  Postoperative hypothyroidism Synthroid  medication running low. - Refilled Synthroid  medication.    No follow-ups on file.  Vinary K Almalik Weissberg, MD [facility] at [facility]   "

## 2024-03-07 ENCOUNTER — Ambulatory Visit: Admitting: Family Medicine

## 2024-03-07 ENCOUNTER — Encounter: Payer: Self-pay | Admitting: Family Medicine

## 2024-03-07 ENCOUNTER — Telehealth: Payer: Self-pay | Admitting: Family Medicine

## 2024-03-07 ENCOUNTER — Ambulatory Visit: Payer: Self-pay

## 2024-03-07 VITALS — BP 106/68 | HR 110 | Temp 100.3°F | Ht 66.0 in | Wt 163.5 lb

## 2024-03-07 DIAGNOSIS — N1832 Chronic kidney disease, stage 3b: Secondary | ICD-10-CM

## 2024-03-07 DIAGNOSIS — J069 Acute upper respiratory infection, unspecified: Secondary | ICD-10-CM

## 2024-03-07 DIAGNOSIS — J101 Influenza due to other identified influenza virus with other respiratory manifestations: Secondary | ICD-10-CM | POA: Diagnosis not present

## 2024-03-07 DIAGNOSIS — C01 Malignant neoplasm of base of tongue: Secondary | ICD-10-CM

## 2024-03-07 LAB — POC COVID19/FLU A&B COMBO
Covid Antigen, POC: NEGATIVE
Influenza A Antigen, POC: POSITIVE — AB
Influenza B Antigen, POC: NEGATIVE

## 2024-03-07 MED ORDER — OSELTAMIVIR PHOSPHATE 75 MG PO CAPS: 75.0000 mg | ORAL_CAPSULE | Freq: Two times a day (BID) | ORAL | 0 refills | Status: AC

## 2024-03-07 NOTE — Telephone Encounter (Signed)
 FYI Only or Action Required?: FYI only for provider: appointment scheduled on 03/07/24.  Patient was last seen in primary care on 02/02/2024 by Kotturi, Vinay K, MD.  Called Nurse Triage reporting Breathing Problem.  Symptoms began several days ago.  Interventions attempted: OTC medications: nyquil.  Symptoms are: unchanged.  Triage Disposition: See Physician Within 24 Hours  Patient/caregiver understands and will follow disposition?:     Message from Harlene ORN sent at 03/07/2024  8:18 AM EST  Reason for Triage: Bad chest cold for 3-4 days. Shortness of breath/struggling to breath.   Reason for Disposition  [1] Continuous (nonstop) coughing interferes with work or school AND [2] no improvement using cough treatment per Care Advice  Answer Assessment - Initial Assessment Questions Scheduled 03/07/24 Advised call back or ED/911 if symptoms worsen. Patient verbalized understanding.   1. ONSET: When did the cough begin?      Monday 2. SEVERITY: How bad is the cough today?      Coughing, chills 3. SPUTUM: Describe the color of your sputum (e.g., none, dry cough; clear, white, yellow, green)     no 4. HEMOPTYSIS: Are you coughing up any blood? If Yes, ask: How much? (e.g., flecks, streaks, tablespoons, etc.)     no 5. DIFFICULTY BREATHING: Are you having difficulty breathing? If Yes, ask: How bad is it? (e.g., mild, moderate, severe)      mild 6. FEVER: Do you have a fever? If Yes, ask: What is your temperature, how was it measured, and when did it start?     Denies fever, n/v 7. CARDIAC HISTORY: Do you have any history of heart disease? (e.g., heart attack, congestive heart failure)      no 8. LUNG HISTORY: Do you have any history of lung disease?  (e.g., pulmonary embolus, asthma, emphysema)     no 9. PE RISK FACTORS: Do you have a history of blood clots? (or: recent major surgery, recent prolonged travel, bedridden)     no 10. OTHER SYMPTOMS: Do you  have any other symptoms? (e.g., runny nose, wheezing, chest pain)     Denies diff breathing, sob, chest pain, faint, wheezing, sore throat runny nose  Protocols used: Cough - Acute Non-Productive-A-AH

## 2024-03-07 NOTE — Progress Notes (Signed)
" ° °  Acute Office Visit  Subjective:     Patient ID: MARIOS GAISER, male    DOB: July 19, 1950, 74 y.o.   MRN: [id number]  Chief Complaint  Patient presents with   Cough    Body aches, Started having symptoms Monday, no fevers    Discussed the use of AI scribe software for clinical note transcription with the patient, who gave verbal consent to proceed.  History of Present Illness Inman S Fulginiti Marcey is a 74 year old male who presents with fever, body ache, and cough since Monday.  He has been experiencing body ache and fever since Monday. He reports that his sister-in-law was recently sick and believes he may have contracted the illness from her.  He reports a mild cough, describing it as 'a little bit,' and does not mention significant respiratory distress or other alarming symptoms.     Review of Systems  All other systems reviewed and are negative.       Objective:    BP 106/68   Pulse (!) 110   Temp 100.3 F (37.9 C) (Oral)   Ht 5' 6 (1.676 m)   Wt 163 lb 8 oz (74.2 kg)   SpO2 96%   BMI 26.39 kg/m     Physical Exam Vitals and nursing note reviewed.  Constitutional:      Appearance: Normal appearance.  HENT:     Head: Normocephalic.     Right Ear: External ear normal.     Left Ear: External ear normal.  Eyes:     Conjunctiva/sclera: Conjunctivae normal.  Cardiovascular:     Rate and Rhythm: Normal rate.  Pulmonary:     Effort: Pulmonary effort is normal. No respiratory distress.  Abdominal:     Palpations: Abdomen is soft.  Musculoskeletal:        General: Normal range of motion.  Skin:    General: Skin is warm.  Neurological:     Mental Status: He is alert and oriented to person, place, and time.  Psychiatric:        Mood and Affect: Mood normal.     Results for orders placed or performed in visit on 03/07/24  POC Covid19/Flu A&B Antigen  Result Value Ref Range   Influenza A Antigen, POC Positive (A) Negative   Influenza B Antigen, POC  Negative Negative   Covid Antigen, POC Negative Negative        Assessment & Plan:   Assessment & Plan Viral upper respiratory tract infection  Orders:   POC Covid19/Flu A&B Antigen  Influenza A  Orders:   oseltamivir  (TAMIFLU ) 75 MG capsule; Take 1 capsule (75 mg total) by mouth 2 (two) times daily for 5 days.  Squamous cell carcinoma of base of tongue (HCC)     Stage 3b chronic kidney disease (HCC)      Assessment and Plan Assessment & Plan Acute upper respiratory infection Flu A positive. Tamiflu  Rx sent. Recommend OTC Tylenol  for fever. Anticipatory guidance given.     No follow-ups on file.  Arnie Maiolo K Kallum Jorgensen, MD  "

## 2024-03-07 NOTE — Telephone Encounter (Signed)
 Received after hours fax this morning that patient wants to schedule an appt to see his provider. Fax didn't indicate what for. Tried reaching pt, no one answered, unable to leave vm. Please schedule pt appt if he calls back.

## 2024-03-07 NOTE — Assessment & Plan Note (Signed)
 SABRA

## 2024-04-25 ENCOUNTER — Ambulatory Visit

## 2024-09-06 ENCOUNTER — Ambulatory Visit: Admitting: Family Medicine

## 2024-10-11 ENCOUNTER — Ambulatory Visit: Admitting: Nurse Practitioner

## 2024-10-11 ENCOUNTER — Other Ambulatory Visit
